# Patient Record
Sex: Female | Born: 1955 | Race: White | Hispanic: No | Marital: Single | Smoking: Current every day smoker
Health system: Southern US, Community
[De-identification: ages and names within clinical notes are randomized; demographics above are authoritative.]

## PROBLEM LIST (undated history)

## (undated) DIAGNOSIS — G4730 Sleep apnea, unspecified: Secondary | ICD-10-CM

## (undated) DIAGNOSIS — M797 Fibromyalgia: Secondary | ICD-10-CM

## (undated) DIAGNOSIS — I509 Heart failure, unspecified: Secondary | ICD-10-CM

## (undated) DIAGNOSIS — M1990 Unspecified osteoarthritis, unspecified site: Secondary | ICD-10-CM

## (undated) DIAGNOSIS — K219 Gastro-esophageal reflux disease without esophagitis: Secondary | ICD-10-CM

## (undated) DIAGNOSIS — I10 Essential (primary) hypertension: Secondary | ICD-10-CM

## (undated) DIAGNOSIS — M419 Scoliosis, unspecified: Secondary | ICD-10-CM

## (undated) DIAGNOSIS — J449 Chronic obstructive pulmonary disease, unspecified: Secondary | ICD-10-CM

## (undated) DIAGNOSIS — E119 Type 2 diabetes mellitus without complications: Secondary | ICD-10-CM

## (undated) HISTORY — PX: HIP SURGERY: SHX245

## (undated) HISTORY — PX: ABDOMINAL HYSTERECTOMY: SHX81

## (undated) HISTORY — PX: APPENDECTOMY: SHX54

## (undated) HISTORY — PX: CHOLECYSTECTOMY: SHX55

---

## 2004-03-13 ENCOUNTER — Ambulatory Visit: Payer: Self-pay | Admitting: Internal Medicine

## 2004-03-14 ENCOUNTER — Ambulatory Visit: Payer: Self-pay | Admitting: Pain Medicine

## 2006-01-20 ENCOUNTER — Emergency Department: Payer: Self-pay | Admitting: Emergency Medicine

## 2006-06-24 ENCOUNTER — Ambulatory Visit: Payer: Self-pay | Admitting: Cardiovascular Disease

## 2006-06-30 ENCOUNTER — Ambulatory Visit: Payer: Self-pay | Admitting: Pain Medicine

## 2006-07-07 ENCOUNTER — Ambulatory Visit: Payer: Self-pay | Admitting: Pain Medicine

## 2007-03-17 ENCOUNTER — Other Ambulatory Visit: Payer: Self-pay

## 2007-03-17 ENCOUNTER — Emergency Department: Payer: Self-pay | Admitting: Emergency Medicine

## 2007-04-16 ENCOUNTER — Inpatient Hospital Stay: Payer: Self-pay | Admitting: Internal Medicine

## 2007-04-22 ENCOUNTER — Encounter: Payer: Self-pay | Admitting: Internal Medicine

## 2007-05-17 ENCOUNTER — Emergency Department: Payer: Self-pay | Admitting: Emergency Medicine

## 2007-06-08 ENCOUNTER — Ambulatory Visit: Payer: Self-pay | Admitting: Physical Medicine & Rehabilitation

## 2007-06-08 ENCOUNTER — Encounter
Admission: RE | Admit: 2007-06-08 | Discharge: 2007-09-06 | Payer: Self-pay | Admitting: Physical Medicine & Rehabilitation

## 2007-07-07 ENCOUNTER — Ambulatory Visit: Payer: Self-pay | Admitting: Physical Medicine & Rehabilitation

## 2007-08-03 ENCOUNTER — Ambulatory Visit: Payer: Self-pay | Admitting: Physical Medicine & Rehabilitation

## 2007-09-11 ENCOUNTER — Encounter
Admission: RE | Admit: 2007-09-11 | Discharge: 2007-12-10 | Payer: Self-pay | Admitting: Physical Medicine & Rehabilitation

## 2007-09-14 ENCOUNTER — Ambulatory Visit: Payer: Self-pay | Admitting: Physical Medicine & Rehabilitation

## 2007-10-19 ENCOUNTER — Ambulatory Visit: Payer: Self-pay | Admitting: Physical Medicine & Rehabilitation

## 2007-11-19 ENCOUNTER — Ambulatory Visit: Payer: Self-pay | Admitting: Physical Medicine & Rehabilitation

## 2007-12-10 ENCOUNTER — Encounter
Admission: RE | Admit: 2007-12-10 | Discharge: 2008-03-09 | Payer: Self-pay | Admitting: Physical Medicine & Rehabilitation

## 2007-12-14 ENCOUNTER — Ambulatory Visit: Payer: Self-pay | Admitting: Physical Medicine & Rehabilitation

## 2008-01-11 ENCOUNTER — Ambulatory Visit: Payer: Self-pay | Admitting: Physical Medicine & Rehabilitation

## 2008-01-20 ENCOUNTER — Inpatient Hospital Stay: Payer: Self-pay | Admitting: Internal Medicine

## 2008-02-05 ENCOUNTER — Emergency Department: Payer: Self-pay | Admitting: Emergency Medicine

## 2008-02-12 ENCOUNTER — Ambulatory Visit: Payer: Self-pay | Admitting: Physical Medicine & Rehabilitation

## 2008-03-10 ENCOUNTER — Encounter
Admission: RE | Admit: 2008-03-10 | Discharge: 2008-06-08 | Payer: Self-pay | Admitting: Physical Medicine & Rehabilitation

## 2008-03-14 ENCOUNTER — Ambulatory Visit: Payer: Self-pay | Admitting: Physical Medicine & Rehabilitation

## 2008-04-14 ENCOUNTER — Ambulatory Visit: Payer: Self-pay | Admitting: Physical Medicine & Rehabilitation

## 2008-05-20 ENCOUNTER — Ambulatory Visit: Payer: Self-pay | Admitting: Physical Medicine & Rehabilitation

## 2008-05-29 ENCOUNTER — Emergency Department: Payer: Self-pay | Admitting: Emergency Medicine

## 2008-06-22 ENCOUNTER — Encounter
Admission: RE | Admit: 2008-06-22 | Discharge: 2008-06-27 | Payer: Self-pay | Admitting: Physical Medicine & Rehabilitation

## 2008-06-27 ENCOUNTER — Ambulatory Visit: Payer: Self-pay | Admitting: Physical Medicine & Rehabilitation

## 2008-09-15 ENCOUNTER — Emergency Department: Payer: Self-pay | Admitting: Emergency Medicine

## 2009-06-28 ENCOUNTER — Emergency Department: Payer: Self-pay | Admitting: Emergency Medicine

## 2009-08-13 ENCOUNTER — Emergency Department: Payer: Self-pay | Admitting: Emergency Medicine

## 2009-08-22 ENCOUNTER — Ambulatory Visit: Payer: Self-pay | Admitting: Pain Medicine

## 2009-08-30 ENCOUNTER — Ambulatory Visit: Payer: Self-pay | Admitting: Pain Medicine

## 2010-02-18 ENCOUNTER — Emergency Department: Payer: Self-pay | Admitting: Emergency Medicine

## 2010-03-06 ENCOUNTER — Ambulatory Visit: Payer: Self-pay | Admitting: Pain Medicine

## 2010-03-14 ENCOUNTER — Ambulatory Visit: Payer: Self-pay | Admitting: Pain Medicine

## 2010-03-19 ENCOUNTER — Emergency Department: Payer: Self-pay | Admitting: Emergency Medicine

## 2010-06-05 NOTE — Assessment & Plan Note (Signed)
Angela Daniel follows up today.  She had a left lumbar RF on December 14, 2007, which produced some relief of her left-sided low back pain.  Her  right-sided RF was performed on January 11, 2008, but this did not  cause any relief.  She had about 2 weeks of relief, but really has not  had any improvement after that time.  Her average pain however is 3/10  with her medications, which is overall an improvement compared to  starting with the injection procedures where it was running around 7/10.  Her pain currently is 5/10, interferes with activity at 7/10 level.  Her  right-sided pain is in the buttock region as well as low back.  Certainly, L5 and below.  Her pain is worse with walking, bending,  sitting, and standing.  Relief from meds is good however.  She does not  climb steps.  She does not drive.  She needs some assistance with meal  prep, household duties, and shopping.   Her review of systems is positive for trouble walking, spasms,  depression, anxiety.  Negative for suicidal thoughts.  Positive for  nausea, weight gain, night sweats, and breathing problems.  She does  have COPD.  She uses oxygen at home.   SOCIAL HISTORY:  Single, lives with her daughter, smokes half pack per  day despite her COPD.   PHYSICAL EXAMINATION:  VITAL SIGNS:  Blood pressure 112/73, pulse 56,  respirations 18, O2 sat 98% on room air.  GENERAL:  In no acute distress.  Orientation x3.  Affect is flat.  Voice  is hoarse.  EXTREMITIES:  Without edema.  BACK:  No evidence of injection site redness.  NEUROLOGIC:  She has normal sensation to lower extremity, normal deep  tendon reflexes, and normal strength.  She has some tenderness over the  right PSIS area and a positive FABER maneuver, but has tightness in  bilateral hips in internal and external rotation.   IMPRESSION:  1. Lumbar spondylosis without myelopathy.  2. Persistent right buttock pain.  She may have sacroiliac disorder      and we will  further assess with sacroiliac injection.   We will continue her fentanyl patch 50 mcg q.48 hours as well as her  Percocet 10/325 one p.o. t.i.d.  Written new prescriptions today.  I  will see her back for a sacroiliac injection in the next available  appointment.      Erick Colace, M.D.  Electronically Signed     AEK/MedQ  D:  02/12/2008 17:43:57  T:  02/13/2008 08:51:37  Job #:  [id number]

## 2010-06-05 NOTE — Procedures (Signed)
[name], Angela Daniel                  ACCOUNT NO.:  [id number]   MEDICAL RECORD NO.:  [id number]           PATIENT TYPE:   LOCATION:                                 FACILITY:   PHYSICIAN:  Erick Colace, M.D.DATE OF BIRTH:  [birth year]   DATE OF PROCEDURE:  DATE OF DISCHARGE:                               OPERATIVE REPORT   PROCEDURE:  Right sacroiliac injection under fluoroscopic guidance.   INDICATIONS:  Right sacroiliac distribution pain.  She had good results  with left sacroiliac injection last month.   Pain is only partially responsive to medication management and  interferes self-care mobility.   Informed consent was obtained after describing the risks and benefits of  the procedure with the patient.  These include bleeding, bruising,  infection, as well as lower extremity weakness.  She elected to proceed  and has given written consent.  The patient placed prone on the  fluoroscopy table.  Betadine prep, sterilely draped.  A 25-gauge, 1-1/2-  inch needle was used to anesthetize the skin and subcutaneous tissue, 1%  lidocaine x2 mL, then a 25-gauge, 3-inch spinal needle was inserted  under fluoroscopic guidance targeting the right sacroiliac joint.  AP,  lateral, and oblique imaging utilized.  Omnipaque 180 under live fluoro  demonstrated no intravascular uptake x0.5 cc, good joint spread followed  by injection of 0.5 mL of 40 mg/mL Depo-Medrol, and 1 mL of 2% MPF  lidocaine.  The patient tolerated the procedure well.  Pre and post  injection vitals stable.  Post injection instructions given.  Nursing  visit in 1 month.  I will see her back in 2 months for repeat injection,  but on the left side.      Erick Colace, M.D.  Electronically Signed     AEK/MEDQ  D:  04/14/2008 11:46:22  T:  04/14/2008 23:53:47  Job:  [id number]

## 2010-06-05 NOTE — Assessment & Plan Note (Signed)
HISTORY OF PRESENT ILLNESS:  The patient is originally here for epidural  injection; however, she would also like to discuss the medications.  She  has a history of hip replacement, lumbar spinal stenosis with chronic  low back and hip pain.  She had a urine drug screen performed as part of  her initial evaluation, which was consistent with medications that she  reported and therefore, we were able to start her back onto narcotic  analgesics, she had been on 75 mg q.48 h.  We restarted her on 25 mcg,  as she had been off medication for a period of time.  We have also been  using Percocet 10/325 t.i.d. for breakthrough pain.   She states her pain is constant in her back, mainly.  Her sleep is poor.  Pain interferes at 8-9/10 level, the need for meds is only fair. She  cannot walk very far.  She does not climb steps.  She needs some assist  with meal prep and household duties.   REVIEW OF SYSTEMS:  Positive for numbness, tingling, trouble walking,  spasms, depression, and anxiety, but negative for suicidal thoughts.  She has some breathing difficulties and diarrhea as well.   She sees the psychiatrist and primary care physician.   SOCIAL HISTORY:  Lives with her daughter.   PHYSICAL EXAMINATION:  GENERAL:  No acute distress.  Mood and affect  appropriate.  Her gait shows no toe drag or knee instability.  Orientation x3.  EXTREMITIES:  Without edema.  VITAL SIGNS:  Blood pressure 131/76, pulse 64, respirations 18, and O2  sat 100% on room air.   IMPRESSION:  1. Lumbar stenosis.  2. Lumbar spondylosis.  3. Lumbar radiculitis.   PLAN:  1. We will increase her fentanyl to 50 mcg q.72 h.  2. Continue on current dose of Percocet 10/325 t.i.d.  We will      reassess.  3. She will go through with her epidural today as well.      Erick Colace, M.D.  Electronically Signed     AEK/MedQ  D:  08/03/2007 10:57:31  T:  08/04/2007 06:24:46  Job #:  [id number]

## 2010-06-05 NOTE — Procedures (Signed)
NAMEKASSADIE, PANCAKE                  ACCOUNT NO.:  [id number]   MEDICAL RECORD NO.:  [id number]          PATIENT TYPE:  REC   LOCATION:  [facility]                          FACILITY:  [facility]   PHYSICIAN:  Erick Colace, M.D.DATE OF BIRTH:  10/25/1955   DATE OF PROCEDURE:  DATE OF DISCHARGE:                               OPERATIVE REPORT   PROCEDURE:  L3-L4 translaminar lumbar epidural steroid injection under  fluoroscopic guidance.   INDICATIONS:  Left lower extremity pain in the thigh and proximal leg.  Pain is interfering with self-care mobility.   Pain is only partially responsive to medication management and other  conservative care.    Informed consent was obtained after describing the risks and benefits of  the procedure with the patient.  These include bleeding, bruising, and  infection, and she elects to proceed and has given written consent.  The  patient placed prone on the fluoroscopy table.  Betadine prep and  sterile drape.  A 25-gauge 1-1/2-inch needle was used to anesthetize the  skin and subcu tissue with 1% lidocaine x2 mL. Then, an 18-gauge [place]  needle was inserted under fluoroscopic guidance starting at L3-L4  interlaminar space.  AP, lateral, and oblique imaging utilized.  Omnipaque 180 and loss of resistance technique was utilized as well.  Positive loss of resistance obtained.  Confirmed with Omnipaque 180 x  1.5 mL showing good epidural spread followed by injection with 2 mL of  40 mg/mL Depo-Medrol and 2.5 mL of 1% MPF lidocaine.  The patient  tolerated the procedure well.  Pre- and post-injection vitals stable.  Post-injection instructions given.  An 95-month followup.      Erick Colace, M.D.  Electronically Signed     AEK/MEDQ  D:  09/14/2007 11:42:55  T:  09/15/2007 03:40:50  Job:  [id number]

## 2010-06-05 NOTE — Group Therapy Note (Signed)
Consult requested by Dr. Timoteo Gaul for the evaluation of chronic low back  pain.  Date of birth 05-May-1955.   CHIEF COMPLAINT:  Neck pain, leg pain.   HISTORY:  A 55 year old female with back pain since 1999.  [place] did a single spine  injection which was not helpful and started the patient on fentanyl  patch.  She has been on the fentanyl patch for several years.  She has  seen Dr. Lacie Scotts, a primary care physician who lost his medical license  due to narcotic analgesic monitoring deficiencies.  Patient complains of  pain that is described as dull, constant, aching in her upper and lower  back.  She has tingling and numbness in her legs.  Her pain interferes  with general activity at a 7/10, enjoyment of life at an 8/10 and  relationships with other people at a 7/10.  Her time of day where her  pain is worst is morning and daytime hours.  Her pain is also worse with  walking, bending, sitting, standing.  Her pain improves with medications  and relief from medications is good.  She does not climb steps, she does  not drive.  She states that her daughter drives her most places where  she needs to go.   She is independent with her activities of daily living but needs some  assistance with meal prep and other household duties.  Her review of  systems positive for weakness, numbness, tingling, spasms, depression,  anxiety.  Review of systems also positive for fever, chills, weight  gain, night sweats, reflux, wheezing, coughing, shortness of breath.  She reports a recent hospitalization for pneumonia and congestive heart  failure.  This was at [facility], I do not have the records.  Her  other past medical history significant for bipolar disorder diagnosed by  psychiatrist, Dr. Janeece Riggers, at [facility].   PAST MEDICAL HISTORY:  Significant for:  1. Heart disease.  2. Lung problems.  3. Stomach.  4. High blood pressure.   PAST SURGICAL HISTORY:  1. Left hip replacement in 1999 or 2000 at [facility] which she      states did not help with her pain.  2. She has also undergone:      a.     Appendectomy.      b.     Cholecystectomy.      c.     Three C-sections      d.     Hysterectomy.   SOCIAL HISTORY:  She is single, lives with her daughter.   FAMILY HISTORY:  1. Heart disease.  2. Lung disease.  3. Diabetes.  4. Hypertension.  5. Psychiatric problems.  6. Disability.   EXAMINATION:  Her blood pressure is 123/63, pulse 87, respiratory rate  is 18, O2 sat 97% on room air.  GENERAL:  No acute distress, orientation x3.  Affect is alert.  Gait is  normal.  NECK:  Full range of motion.  She has negative Spurling's maneuver.  UPPER EXTREMITY RANGE OF MOTION AND JOINT STABILITY:  Normal.  She has  normal strength at the biceps, triceps, grip as well as deltoids.  She  has mild pain in left shoulder with abduction testing.  BACK RANGE OF MOTION:  Has 75% forward flexion but only 25% extension;  however, she states that forward flexion is more painful than extension.  She has no evidence of scoliosis.  She has only mild tenderness to  palpation of the lumbar paraspinals but otherwise nontender in the  cervical or thoracic spine.  LOWER EXTREMITY:  Range of motion normal.  Strength normal.  Joint  stability normal.  She has a scar over left hip corresponding to her  report of total hip replacement.  She has normal deep tendon reflexes  bilateral upper and lower extremities.  She has normal sensation in  bilateral upper and lower extremities.  She has no evidence of edema and  she has normal peripheral pulses.   Review of MRI dated October 28, 2006, showed no abnormalities at L1-2,  there is mild facet changes L2-3 level, mild bulge but no central or  foraminal narrowing and L3/4 mild bulge, mild facet changes, mild left  foraminal narrowing, some encroachment of left L3 nerve root within the   neural foramen.  Right L4-5 moderate facet changes, mild central canal  stenosis, mild bilateral lateral recess narrowing and mild to moderate  bilateral neural foraminal narrowing left greater than right, may be  encroachment of left L4 nerve root within the foramen.  L5 S1 mild  bulge, mild facet changes, no stenosis.   IMPRESSION:  1. Lumbar pain.  Exam is more limited in extension which makes me      question facet problems; however, forward flexion causes more pain.      She does not have any major disk protrusions.  She does have some      mild spinal stenosis at L4 to 5 level.  Question whether the reason      her left hip replacement did not result in any improvement could be      that this may represent either an L3 or L4 radiculopathy.  MRI was      an automated model, right lower extremity was studied in terms of      needle EMG and nerve conductions were reported as normal, reports      of 1+ positive sharp waves right L4 and left L5 paraspinals, this      was performed January 21, 2007.  This patient has not had any      significant physical therapy other than post total hip, I think      this would be in order.  2. Lidoderm trial for her low back and axial pain.  3. Urine drug screen, no narcotic analgesics until results are      available, should really just show some Percocet and no fentanyl.  4. Lower extremity numbness, tingling, would doubt it is related to      her back given her last lumbar MRI showing no significant stenosis,      especially on the right side.  May need to repeat EMG given the      type that was performed at Dr. Carron Brazen office.   Thank you for this interesting consultation, I will keep you apprised of  the situation.  I discussed my findings with the patient, she  understands that no narcotic analgesic will be prescribed until I have a  chance to review and in the  interval time will try non-narcotic treatment.  If she has non-reported   opiates or illicit drugs will be discharged from clinic.      Erick Colace, M.D.  Electronically Signed     AEK/MedQ  D:  06/08/2007 16:17:39  T:  06/08/2007 17:49:50  Job #:  [id number]   cc:   Galen Daft. Timoteo Gaul, M.D.  [address]  [place], [place] [address]   Lynett Fish, M.D.  [facility]

## 2010-06-05 NOTE — Procedures (Signed)
Angela Daniel, Angela Daniel                  ACCOUNT NO.:  [id number]   MEDICAL RECORD NO.:  [id number]          PATIENT TYPE:  REC   LOCATION:  [facility]                          FACILITY:  [facility]   PHYSICIAN:  Erick Colace, M.D.DATE OF BIRTH:  06/15/1955   DATE OF PROCEDURE:  DATE OF DISCHARGE:                               OPERATIVE REPORT   PROCEDURE:  Left L3 transforaminal lumbar epidural steroid injection  under fluoroscopic guidance.   INDICATION:  Disk bulge L3-L4 with encroachment of left L3 nerve root  within the neural foramen causing lower extremity pain, only partially  responsive to medication management including narcotic analgesics and  interfering with self-care and mobility.   PROCEDURE:  Informed consent was obtained after describing risks and  benefits of the procedure with the patient.  These include bleeding,  bruising, and infection as well as temporary or permanent paralysis.  She elects to proceed and has given written consent.  The patient was  placed prone on fluoroscopy table.  Betadine prep, sterile drape.  A 25-  gauge 1-1/2 inch needle was used to anesthetize the skin and  subcutaneous tissue, 1% lidocaine x2 mL and a 22-gauge 3-1/2 inch spinal  needle was inserted under fluoroscopic guidance, first targeting the  left L3-L4 intervertebral foramen.  AP, lateral, and oblique imaging  utilized.  Omnipaque 180 demonstrated good nerve root as well as  epidural spread followed by injection of 1 mL of 10 mg/mL dexamethasone  and 2 mL of 1% MPF lidocaine.  The patient tolerated the procedure well.  Pre and post-injection instructions given.  Post-injection vitals  stable.  Pre-injection pain level 6-7/10 and post-injection 4-5/10.  We  will follow up in 1 month, possible repeat injection.      Erick Colace, M.D.  Electronically Signed     AEK/MEDQ  D:  08/03/2007 10:54:27  T:  08/03/2007 22:11:37  Job:  [id number]

## 2010-06-05 NOTE — Procedures (Signed)
NAMEERICKA, Angela Daniel                  ACCOUNT NO.:  [id number]   MEDICAL RECORD NO.:  [id number]           PATIENT TYPE:   LOCATION:                                 FACILITY:   PHYSICIAN:  Erick Colace, M.D.DATE OF BIRTH:  12/11/1955   DATE OF PROCEDURE:  DATE OF DISCHARGE:                               OPERATIVE REPORT   PROCEDURE:  Bilateral L5 dorsal ramus injection, bilateral L4 medial  branch block, bilateral L3 medial branch block under fluoroscopic  guidance.   INDICATIONS:  Lumbosacral spondylosis without myelopathy.  Procedure  done under both sacral and lumbar fluoroscopic guidance.   Informed consent was obtained after describing risks and benefits of the  procedure with the patient.  These include bleeding, bruising,  infection.  She elects to proceed and has given written consent.  The  patient placed prone on fluoroscopy table.  Betadine prep, sterilely  draped.  A 25-gauge, 1-1/2-inch needle was used to anesthetize the skin  and subcu tissue, 1% lidocaine x2 mL and 22-gauge, 3-1/2-inch needle was  inserted under fluoroscopic guidance first starting at the left S1, SAP,  and sacroiliac junction.  Bone contact made, confirmed with lateral  imaging.  Omnipaque 180 x 0.5 mL demonstrated no intravascular uptake.  Then, 0.5 mL dexamethasone-lidocaine solution was injected, the solution  containing 1 mL of 4 mg/ mL dexamethasone, 2 mL of 2% MPF lidocaine.  Then, the left L5, SAP, and transverse process junction targeted, bone  contact made confirmed with lateral imaging.  Omnipaque 180 x 0.5 mL  demonstrated no intravascular uptake.  Then, 0.5 mL of dexamethasone-  lidocaine solution was injected.  Then, the left L4, SAP, transverse  process junction targeted, bone contact made confirmed with lateral  imaging.  Omnipaque 180 x 0.5 mL demonstrated no intravascular uptake.  Then, 0.5 mL of dexamethasone-lidocaine solution was injected.  The  patient tolerated the  procedure well.  The same procedure was repeated  on the right side at corresponding levels using same needle injectate  technique.  The patient tolerated the procedure well.  Pre-injection and  post injection vitals stable.  Post injection instructions given.  Pre  injection 5/10, post injection 3/10, consider sacroiliac versus repeat  medial branch blocks next visit.      Erick Colace, M.D.  Electronically Signed     AEK/MEDQ  D:  10/19/2007 13:33:33  T:  10/20/2007 03:04:02  Job:  [id number]

## 2010-06-05 NOTE — Procedures (Signed)
Angela Daniel, Angela Daniel                  ACCOUNT NO.:  [id number]   MEDICAL RECORD NO.:  [id number]          PATIENT TYPE:  REC   LOCATION:  [facility]                          FACILITY:  [facility]   PHYSICIAN:  Erick Colace, M.D.DATE OF BIRTH:  August 23, 1955   DATE OF PROCEDURE:  06/27/2008  DATE OF DISCHARGE:                               OPERATIVE REPORT   PROCEDURE:  Bilateral sacroiliac injection under fluoroscopic guidance.   INDICATIONS:  Sacroiliac pain.  She had good results with right-sided  injection and now she is here for bilateral, given that she has similar  pain on the left side.  Pain is only partially responsive to medication  management, interferes with self-care mobility and is graded at a 5/10  at rest, but 7/10 with activity.   Informed consent was obtained after describing risks and benefits of the  procedure with the patient.  These include bleeding, bruising, and  infection as well as temporary or permanent paralysis.  She elects to  proceed and has given written consent.  The patient was placed prone on  the fluoroscopy table.  Betadine prep, sterile drape.  A 25-gauge 1-1/2-  inch needle was used to anesthetize the skin and subcutaneous tissue  with 1% lidocaine x2 mL.  Then, a 25-gauge 3-inch spinal needle was  inserted under fluoroscopic guidance, first in the left SI joint.  AP,  lateral, and oblique images were utilized.  Omnipaque 180 under live  fluoro x0.3 mL demonstrated good joint outline followed by injection of  1 mL of 2% MPF lidocaine and 1.5 mL of 40 mg/mL Depo-Medrol.  The same  procedure was repeated on the right side using same needle, injectate,  and technique.  The patient tolerated the procedure well.  Pre -and post-  injection vitals were stable.  Post-injection instructions were given.  She will have nursing visit in about 3 weeks for medication and see me  in about 7 weeks.      Erick Colace, M.D.  Electronically Signed     AEK/MEDQ  D:  06/27/2008 09:57:30  T:  06/28/2008 00:44:52  Job:  [id number]

## 2010-06-05 NOTE — Procedures (Signed)
NAMEBLESS, LISENBY                  ACCOUNT NO.:  [id number]   MEDICAL RECORD NO.:  [id number]           PATIENT TYPE:   LOCATION:                                 FACILITY:   PHYSICIAN:  Erick Colace, M.D.DATE OF BIRTH:  1955/07/09   DATE OF PROCEDURE:  DATE OF DISCHARGE:                               OPERATIVE REPORT   PROCEDURE:  This is a bilateral L5 dorsal ramus injection, bilateral L4  medial branch block, and bilateral L3 medial branch block under  fluoroscopic guidance.   INDICATION:  Lumbar facet-mediated pain spondylosis without myelopathy.  Pain interferes with self-care mobility and was relieved after prior set  of medial branch blocks performed 1 month ago.   Pain interferes with self-care and mobility and it persists despite  medication management including narcotic analgesics and other  conservative care.   Informed consent was obtained after describing the risks and benefits of  the procedure with the patient.  These include bleeding, bruising, and  infection.  She elects to proceed and has given a written consent.  The  patient was placed prone on fluoroscopy table.  Betadine prep and  sterile draped.  A 25-gauge, 1-1/2-inch needle was used to anesthetize  the skin and subcutaneous tissue with 1% lidocaine x2 mL.  Then, a 22-  gauge, 3-1/2 inch spinal needle was inserted under fluoroscopic  guidance.  First targeting the left S1 SAP sacral ala junction, bone  contact made confirmed with lateral imaging.  Omnipaque 180 x 0.5 mL  demonstrated no intravascular uptake, then 0.5 mL of dexamethasone-  lidocaine solution injected.  Then, the left L5 SAP transverse process  junction, targeted bone contact made confirmed with lateral imaging.  Omnipaque 180 x 0.5 mL demonstrated no intravascular uptake, then 0.5 mL  dexamethasone-lidocaine solution was injected.  Then, a left L4 SAP  transverse process junction, targeted bone contact made confirmed with  lateral  imaging.  Omnipaque 180 x 0.5 mL demonstrated no intravascular  uptake, then 0.5 mL dexamethasone-lidocaine solution was injected.  The  same procedure was repeated on the right side with same needle injectate  and technique.  The patient tolerated the procedure well.  Pre- and post-  injection and vitals stable.  Post-injection instructions were given.  Post-injection pain levels are similar.      Erick Colace, M.D.  Electronically Signed     AEK/MEDQ  D:  11/19/2007 11:41:54  T:  11/20/2007 01:29:15  Job:  [id number]

## 2010-06-05 NOTE — Procedures (Signed)
Angela Daniel, Angela Daniel                  ACCOUNT NO.:  [id number]   MEDICAL RECORD NO.:  [id number]           PATIENT TYPE:   LOCATION:                                 FACILITY:   PHYSICIAN:  Erick Colace, M.D.DATE OF BIRTH:  04-27-1955   DATE OF PROCEDURE:  DATE OF DISCHARGE:                               OPERATIVE REPORT   This is a left sacroiliac injection under fluoroscopic guidance.   INDICATIONS:  Left sacroiliac distribution pain.  Pain only partially  responsive to medication management and interferes with self-care and  mobility.   Informed consent was obtained after describing risks and benefits of  procedure to the patient.  These include bleeding, bruising, infection,  as well as lower extremity weakness.  She elects to proceed and has  given consent.   The patient was placed prone on fluoroscopy table.  Betadine prep,  sterile drape.  A 25-gauge inch and a half needle was used to  anesthetize the skin and subcu tissue, 1% lidocaine x2 mL.  Then, a 25-  gauge 3-inch spinal needle was inserted under fluoroscopic guidance  targeting the left SI joint.  AP, lateral, and oblique imaging utilized.  Omnipaque 180 under live fluoro demonstrated no intravascular uptake.  Good joint spread followed by injection of 0.5 mL of 40 mg/mL Depo-  Medrol and 1 mL of 2% MPF lidocaine.  The patient tolerated the  procedure well.  Pre and postinjection vitals stable.  Postinjection  instructions given.      Erick Colace, M.D.  Electronically Signed     AEK/MEDQ  D:  03/14/2008 11:31:15  T:  03/15/2008 00:47:23  Job:  [id number]

## 2010-06-05 NOTE — Procedures (Signed)
NAMESYNDEY, JASKOLSKI                  ACCOUNT NO.:  [id number]   MEDICAL RECORD NO.:  [id number]           PATIENT TYPE:   LOCATION:                                 FACILITY:   PHYSICIAN:  Erick Colace, M.D.DATE OF BIRTH:  12-31-1955   DATE OF PROCEDURE:  DATE OF DISCHARGE:                               OPERATIVE REPORT   PROCEDURE:  Right L5 dorsal ramus radiofrequency neurotomy, right L4  medial branch radiofrequency neurotomy, and right L3 medial branch  radiofrequency neurotomy under fluoroscopic guidance.   INDICATIONS:  Lumbar facet-mediated pain, spondylosis without myelopathy  with pain interfere with self-care mobility relieved with 2 sets of  medial branch blocks and improvements on the left side after  radiofrequency procedure at corresponding levels 1 month ago.   Informed consent was obtained after describing risks and benefits of  procedure with the patient, these include bleeding, bruising, and  infection as well as loss of bowel or bladder function and paralysis.  She elects to proceed and has given written consent.  The patient was  placed prone on fluoroscopy table.  Betadine prep, sterile drape, 25-  gauge 1.5-inch needle was used to anesthetize the skin and subcu tissue  with 1% lidocaine x2 mL at each of three sites, and a 20-gauge 10-cm RF  needle with 10-mm curved active tip was inserted under fluoroscopic  guidance starting at right S1-SAP sacral ala junction, bone contact  made, confirmed with lateral imaging, sensory stem at 50 Hz followed by  motor stem at 2 Hz confirmed proper needle location.  This was followed  by injection of 1 mL of solution containing 1 mL of 4 mg/mL  dexamethasone and 2 mL of 1% MPF lidocaine followed by radiofrequency  lesioning initially at 70 degrees, but reduced to 60 degrees after some  burning sensation for 70 seconds.  Then, the right L5-SAP transverse  junction targeted, bone contact made, confirmed with lateral  imaging.  Sensory stem at 50 Hz followed by motor stem at 2 Hz confirmed proper  needle location followed by injection of 1 mL of dexamethasone-lidocaine  solution and radiofrequency lesioning at 70 degrees Celsius for 70  seconds.  Then, the right L4-SAP transverse junction targeted, bone  contact made, confirmed with lateral imaging.  Sensory stem at 50 Hz  followed by motor stem at 2 Hz confirmed proper needle location followed  by injection of 1 mL of dexamethasone-lidocaine solution and  radiofrequency lesioning at 70 degrees Celsius for 70 seconds.  The  patient tolerated procedure well.  Post-injection vitals stable.  Post-  injection instructions given.      Erick Colace, M.D.  Electronically Signed     AEK/MEDQ  D:  01/11/2008 09:57:00  T:  01/11/2008 23:12:03  Job:  [id number]

## 2010-06-05 NOTE — Assessment & Plan Note (Signed)
A 55 year old female with back pain since 1999.  She was seen by me in  initial consultation on Jun 08, 2007.  She has had her last MRI on  October 28, 2006, showing L3-L4 disk bulge, mild facet changes, mild left  foraminal narrowing, some encroachment on the left L3 nerve root within  the neuroforamen.   She has undergone prior left hip replacement, but really did not get any  benefit from the replacement in terms of pain relief in the left hip  area.  She did have EMG showing some positive sharp waves in the  paraspinals only.   She was trialed on Lidoderm for back and axial pain, which was not  helpful.  Her urine drug screen checked out consistent and hydrocodone  was called in.  She has been taking this 4 times a day, but it has not  been helpful for her to the degree that fentanyl and Percocet were  helpful.   She has not had any new medical problems in the interval time.   REVIEW OF SYSTEMS:  Positive for tingling, trouble walking, depression,  and anxiety.  She does continue to see Psychiatry.  She has had some  night sweats, she has had some respiratory infection and is planning to  have some dental work done due to some dental pain.   Sleep is poor.  Relief from meds is 3/10.   SOCIAL HISTORY:  Divorced, lives with her daughter, denies drug or  alcohol abuse.   PHYSICAL EXAMINATION:  Her blood pressure is 117/80, pulse 67,  respiration 18, and O2 sat 100% on room air.  Well-developed, well-  nourished female in no acute distress.  Orientation x3.  Affect is  alert.  Gait is normal.  Her back has tenderness at the L4-L5 and L5-S1 paraspinals.  Her back  range of motion is 75% forward flexion and 25% extension.  Her range of  motion is normal in the upper extremities, but in the lower extremities,  she has decreased internal and external rotation and bilateral hips,  knee, and ankle range of motion are normal.  Joint stability appears  normal.  Normal deep tendon  reflexes in the upper and lower extremities.   There is no evidence of edema in the upper or lower extremities.   IMPRESSION:  1. History of lumbar stenosis, worse at left L3.  This is her most      symptomatic area in terms of dermatomal and may represent her left      hip pain.  We will schedule for left L3 transforaminal lumbar      epidural steroid injection under fluoroscopic guidance.  2. In terms of her narcotic analgesic medications, we will switch her      back to her fentanyl, but reduce from the prior dose of 75 to 25      mcg and use Percocet 10/325 t.i.d. for breakthrough, as she does      have activity-related pain on top of her baseline back pain.  We      would consider other medications such as Lyrica or Neurontin for      lower extremity pain as well.  These have not been trialed by Dr.      Lacie Scotts; however, we do not have records from the [facility] in      [place].      Erick Colace, M.D.  Electronically Signed     AEK/MedQ  D:  07/07/2007 11:25:44  T:  07/07/2007 22:52:31  Job #:  [id number]   cc:   Lynett Fish, MD   Gelene Mink, MD

## 2010-06-05 NOTE — Procedures (Signed)
Angela Daniel, Angela Daniel                  ACCOUNT NO.:  [id number]   MEDICAL RECORD NO.:  [id number]          PATIENT TYPE:  REC   LOCATION:  [facility]                          FACILITY:  [facility]   PHYSICIAN:  Erick Colace, M.D.DATE OF BIRTH:  11-30-1955   DATE OF PROCEDURE:  DATE OF DISCHARGE:                               OPERATIVE REPORT   PROCEDURE:  Left L5 dorsal ramus radiofrequency neurotomy, left L4  medial branch radiofrequency neurotomy, left L3 medial branch  radiofrequency neurotomy under fluoroscopic guidance.   INDICATIONS:  Lumbar facet mediated pain, spondylosis without myelopathy  with pain interfering with self-care and mobility and relieved with 2  sets of medial branch blocks performed at corresponding levels.   Informed consent was obtained after describing risks and benefits of the  procedure with the patient.  These include bleeding, bruising,  infection.  She elects to proceed and has given written consent.  The  patient was placed prone on fluoroscopy table.  Betadine prep, and  sterile drape.  A 25-gauge, 1-1/2 needle was used to anesthetize the  skin and subcutaneous tissue, 1% lidocaine x2 mL.  Then, a 20-gauge 10-  cm RF needle with 10-mm curved active tip was inserted under  fluoroscopic guidance targeting the left S1 SAP, sacral ala junction  bone contact made, confirmed with lateral imaging.  Sensory stem at 50  Hz followed by motor stem at 2 Hz confirmed proper needle location  followed by injection of 1 mL of solution containing 1 mL of 4 mL  dexamethasone, 2 mL of 1% MPF lidocaine, followed by radiofrequency  lesioning 70 degrees Celsius for 70 seconds.  Then, the left L5 SAP  transverse process junction targeted, bone contact made, and confirmed  with lateral imaging.  Sensory stem at 50 Hz followed by motor stem at 2  Hz confirmed proper needle location followed by injection of 1 mL of the  dexamethasone and lidocaine solution and radiofrequency  lesioning 70  degrees for 70 seconds.  In the left L4 SAP, transverse process junction  targeted, bone contact made, confirmed with lateral imaging.  Sensory  stem at 50 Hz followed by motor stem at 2 Hz confirmed proper needle  location followed by injection of 1 mL of dexamethasone, lidocaine  solution, and radiofrequency lesioning 70 degrees Celsius for 70  seconds.  The patient tolerated the procedure well.  Pre- and post-  injection vitals stable.  Postinjection instructions given.      Erick Colace, M.D.  Electronically Signed     AEK/MEDQ  D:  12/14/2007 09:51:48  T:  12/15/2007 00:01:55  Job:  [id number]

## 2010-08-12 ENCOUNTER — Emergency Department: Payer: Self-pay | Admitting: Emergency Medicine

## 2010-09-20 ENCOUNTER — Emergency Department: Payer: Self-pay | Admitting: Emergency Medicine

## 2010-11-12 ENCOUNTER — Emergency Department: Payer: Self-pay | Admitting: Emergency Medicine

## 2010-11-14 ENCOUNTER — Emergency Department: Payer: Self-pay | Admitting: Internal Medicine

## 2010-11-26 ENCOUNTER — Emergency Department: Payer: Self-pay | Admitting: *Deleted

## 2012-06-28 ENCOUNTER — Emergency Department: Payer: Self-pay | Admitting: Emergency Medicine

## 2012-06-28 LAB — BASIC METABOLIC PANEL
Anion Gap: 6 — ABNORMAL LOW (ref 7–16)
BUN: 9 mg/dL (ref 7–18)
Calcium, Total: 8.8 mg/dL (ref 8.5–10.1)
Creatinine: 0.73 mg/dL (ref 0.60–1.30)
EGFR (African American): >60
Glucose: 118 mg/dL — ABNORMAL HIGH (ref 65–99)
Osmolality: 277 (ref 275–301)
Sodium: 139 mmol/L (ref 136–145)

## 2012-06-28 LAB — TROPONIN I: Troponin-I: <0.02 ng/mL

## 2012-06-28 LAB — CBC
MCH: 31.4 pg (ref 26.0–34.0)
MCHC: 34.6 g/dL (ref 32.0–36.0)
Platelet: 218 10*3/uL (ref 150–440)
RBC: 4.12 10*6/uL (ref 3.80–5.20)
RDW: 13.2 % (ref 11.5–14.5)
WBC: 8 10*3/uL (ref 3.6–11.0)

## 2013-11-08 ENCOUNTER — Emergency Department: Payer: Self-pay | Admitting: Emergency Medicine

## 2014-03-27 ENCOUNTER — Emergency Department: Payer: Self-pay | Admitting: Emergency Medicine

## 2014-10-25 ENCOUNTER — Encounter: Payer: Self-pay | Admitting: Emergency Medicine

## 2014-10-25 ENCOUNTER — Emergency Department
Admission: EM | Admit: 2014-10-25 | Discharge: 2014-10-25 | Discharge status: Against Medical Advice | Payer: Medicare Other | Attending: Student | Admitting: Student

## 2014-10-25 DIAGNOSIS — Z720 Tobacco use: Secondary | ICD-10-CM | POA: Insufficient documentation

## 2014-10-25 DIAGNOSIS — G8929 Other chronic pain: Secondary | ICD-10-CM

## 2014-10-25 DIAGNOSIS — R229 Localized swelling, mass and lump, unspecified: Secondary | ICD-10-CM | POA: Diagnosis not present

## 2014-10-25 DIAGNOSIS — F1120 Opioid dependence, uncomplicated: Secondary | ICD-10-CM

## 2014-10-25 DIAGNOSIS — Z760 Encounter for issue of repeat prescription: Secondary | ICD-10-CM | POA: Diagnosis present

## 2014-10-25 HISTORY — DX: Fibromyalgia: M79.7

## 2014-10-25 HISTORY — DX: Unspecified osteoarthritis, unspecified site: M19.90

## 2014-10-25 MED ORDER — OXYCODONE-ACETAMINOPHEN 5-325 MG PO TABS
1.0000 | ORAL_TABLET | Freq: Once | ORAL | Status: AC
  Administered 2014-10-25: 1 via ORAL
  Filled 2014-10-25: qty 1
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
## 2014-10-25 NOTE — ED Notes (Signed)
States she is here for medication refill . PA in room on arrival
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
## 2014-11-06 ENCOUNTER — Inpatient Hospital Stay
Admission: EM | Admit: 2014-11-06 | Discharge: 2014-11-09 | DRG: 190 | Disposition: A | Discharge status: Home / Self Care | Payer: Medicare Other | Attending: Internal Medicine | Admitting: Internal Medicine

## 2014-11-06 ENCOUNTER — Emergency Department: Payer: Medicare Other

## 2014-11-06 ENCOUNTER — Encounter: Payer: Self-pay | Admitting: Emergency Medicine

## 2014-11-06 DIAGNOSIS — Z8249 Family history of ischemic heart disease and other diseases of the circulatory system: Secondary | ICD-10-CM | POA: Diagnosis not present

## 2014-11-06 DIAGNOSIS — M1990 Unspecified osteoarthritis, unspecified site: Secondary | ICD-10-CM | POA: Diagnosis not present

## 2014-11-06 DIAGNOSIS — E785 Hyperlipidemia, unspecified: Secondary | ICD-10-CM | POA: Diagnosis not present

## 2014-11-06 DIAGNOSIS — Z825 Family history of asthma and other chronic lower respiratory diseases: Secondary | ICD-10-CM

## 2014-11-06 DIAGNOSIS — Z801 Family history of malignant neoplasm of trachea, bronchus and lung: Secondary | ICD-10-CM

## 2014-11-06 DIAGNOSIS — J9601 Acute respiratory failure with hypoxia: Secondary | ICD-10-CM | POA: Diagnosis present

## 2014-11-06 DIAGNOSIS — D72829 Elevated white blood cell count, unspecified: Secondary | ICD-10-CM | POA: Diagnosis present

## 2014-11-06 DIAGNOSIS — Z9889 Other specified postprocedural states: Secondary | ICD-10-CM | POA: Diagnosis not present

## 2014-11-06 DIAGNOSIS — M797 Fibromyalgia: Secondary | ICD-10-CM | POA: Diagnosis present

## 2014-11-06 DIAGNOSIS — S51012A Laceration without foreign body of left elbow, initial encounter: Secondary | ICD-10-CM | POA: Diagnosis present

## 2014-11-06 DIAGNOSIS — Z90710 Acquired absence of both cervix and uterus: Secondary | ICD-10-CM | POA: Diagnosis not present

## 2014-11-06 DIAGNOSIS — Z823 Family history of stroke: Secondary | ICD-10-CM

## 2014-11-06 DIAGNOSIS — I509 Heart failure, unspecified: Secondary | ICD-10-CM | POA: Diagnosis not present

## 2014-11-06 DIAGNOSIS — F17200 Nicotine dependence, unspecified, uncomplicated: Secondary | ICD-10-CM | POA: Diagnosis present

## 2014-11-06 DIAGNOSIS — G4730 Sleep apnea, unspecified: Secondary | ICD-10-CM | POA: Diagnosis present

## 2014-11-06 DIAGNOSIS — R000 Tachycardia, unspecified: Secondary | ICD-10-CM | POA: Diagnosis present

## 2014-11-06 DIAGNOSIS — J189 Pneumonia, unspecified organism: Secondary | ICD-10-CM

## 2014-11-06 DIAGNOSIS — Z9049 Acquired absence of other specified parts of digestive tract: Secondary | ICD-10-CM

## 2014-11-06 DIAGNOSIS — W19XXXA Unspecified fall, initial encounter: Secondary | ICD-10-CM | POA: Diagnosis not present

## 2014-11-06 DIAGNOSIS — Z833 Family history of diabetes mellitus: Secondary | ICD-10-CM

## 2014-11-06 DIAGNOSIS — J440 Chronic obstructive pulmonary disease with acute lower respiratory infection: Principal | ICD-10-CM | POA: Diagnosis present

## 2014-11-06 DIAGNOSIS — F329 Major depressive disorder, single episode, unspecified: Secondary | ICD-10-CM | POA: Diagnosis not present

## 2014-11-06 DIAGNOSIS — G8929 Other chronic pain: Secondary | ICD-10-CM | POA: Diagnosis not present

## 2014-11-06 DIAGNOSIS — J441 Chronic obstructive pulmonary disease with (acute) exacerbation: Secondary | ICD-10-CM | POA: Diagnosis not present

## 2014-11-06 DIAGNOSIS — F17210 Nicotine dependence, cigarettes, uncomplicated: Secondary | ICD-10-CM | POA: Diagnosis present

## 2014-11-06 HISTORY — DX: Chronic obstructive pulmonary disease, unspecified: J44.9

## 2014-11-06 HISTORY — DX: Heart failure, unspecified: I50.9

## 2014-11-06 HISTORY — DX: Sleep apnea, unspecified: G47.30

## 2014-11-06 LAB — BASIC METABOLIC PANEL
Anion gap: 6 (ref 5–15)
BUN: 16 mg/dL (ref 6–20)
CALCIUM: 8.7 mg/dL — AB (ref 8.9–10.3)
CO2: 32 mmol/L (ref 22–32)
CREATININE: 0.69 mg/dL (ref 0.44–1.00)
Chloride: 100 mmol/L — ABNORMAL LOW (ref 101–111)
GFR calc Af Amer: >60 mL/min (ref >60)
GFR calc non Af Amer: >60 mL/min (ref >60)
GLUCOSE: 96 mg/dL (ref 65–99)
Potassium: 3.6 mmol/L (ref 3.5–5.1)
Sodium: 138 mmol/L (ref 135–145)

## 2014-11-06 LAB — CBC WITH DIFFERENTIAL/PLATELET
Basophils Absolute: 0.1 10*3/uL (ref 0–0.1)
Basophils Relative: 1 %
EOS ABS: 0 10*3/uL (ref 0–0.7)
EOS PCT: 0 %
HCT: 34.7 % — ABNORMAL LOW (ref 35.0–47.0)
Hemoglobin: 11.9 g/dL — ABNORMAL LOW (ref 12.0–16.0)
LYMPHS ABS: 1.5 10*3/uL (ref 1.0–3.6)
LYMPHS PCT: 11 %
MCH: 31.8 pg (ref 26.0–34.0)
MCHC: 34.1 g/dL (ref 32.0–36.0)
MCV: 93.2 fL (ref 80.0–100.0)
MONO ABS: 0.8 10*3/uL (ref 0.2–0.9)
Monocytes Relative: 6 %
Neutro Abs: 11.6 10*3/uL — ABNORMAL HIGH (ref 1.4–6.5)
Neutrophils Relative %: 82 %
PLATELETS: 234 10*3/uL (ref 150–440)
RBC: 3.73 MIL/uL — ABNORMAL LOW (ref 3.80–5.20)
RDW: 12.4 % (ref 11.5–14.5)
WBC: 14.1 10*3/uL — ABNORMAL HIGH (ref 3.6–11.0)

## 2014-11-06 LAB — TROPONIN I: Troponin I: <0.03 ng/mL (ref <0.031)

## 2014-11-06 MED ORDER — TETANUS-DIPHTHERIA TOXOIDS TD 5-2 LFU IM INJ
0.5000 mL | INJECTION | Freq: Once | INTRAMUSCULAR | Status: AC
  Administered 2014-11-06: 0.5 mL via INTRAMUSCULAR
  Filled 2014-11-06: qty 0.5

## 2014-11-06 MED ORDER — IPRATROPIUM-ALBUTEROL 0.5-2.5 (3) MG/3ML IN SOLN
9.0000 mL | Freq: Once | RESPIRATORY_TRACT | Status: AC
  Administered 2014-11-06: 9 mL via RESPIRATORY_TRACT
  Filled 2014-11-06: qty 9

## 2014-11-06 MED ORDER — METHYLPREDNISOLONE SODIUM SUCC 125 MG IJ SOLR
125.0000 mg | Freq: Once | INTRAMUSCULAR | Status: AC
  Administered 2014-11-06: 125 mg via INTRAVENOUS
  Filled 2014-11-06: qty 2

## 2014-11-06 NOTE — ED Notes (Signed)
Pt appears very tired and sleepy

## 2014-11-06 NOTE — ED Notes (Signed)
RT at bedside at this time collecting blood gas.

## 2014-11-06 NOTE — ED Provider Notes (Signed)
[facility] Emergency Department Provider Note  ____________________________________________  Time seen: Approximately 950 PM  I have reviewed the triage vital signs and the nursing notes.   HISTORY  Chief Complaint Altered Mental Status; Cough; Shortness of Breath; Weakness; and Emesis    HPI Angela Daniel is a 59 y.o. female with a history of COPD and congestive heart failure who is presenting today with shortness of breath over the past week. She also had some confusion earlier in the day but her mental status is since improved. Patient continues to smoke. Patient says she has been using about one nebulizer treatment per day. Denies any pain except her left elbow where she had a fall with skin tear yesterday. Does not know date of last tetanus shot. Desat to 88% on room air prior to my arrival to the room. Placed on nasal cannula oxygen.   Past Medical History  Diagnosis Date  . Fibromyalgia   . Degenerative joint disease   . Sleep apnea   . COPD (chronic obstructive pulmonary disease) (HCC)   . Congestive heart failure (HCC)     There are no active problems to display for this patient.   Past Surgical History  Procedure Laterality Date  . Hip surgery Left   . Cesarean section    . Abdominal hysterectomy      No current outpatient prescriptions on file.  Allergies Review of patient's allergies indicates no known allergies.  History reviewed. No pertinent family history.  Social History Social History  Substance Use Topics  . Smoking status: Current Every Day Smoker -- 0.50 packs/day    Types: Cigarettes  . Smokeless tobacco: None  . Alcohol Use: No    Review of Systems Constitutional: No fever/chills Eyes: No visual changes. ENT: No sore throat. Cardiovascular: Denies chest pain. Respiratory: As above Gastrointestinal: No abdominal pain.  No nausea, no vomiting.  No diarrhea.  No constipation. Genitourinary: Negative for  dysuria. Musculoskeletal: Negative for back pain. Skin: Negative for rash. Neurological: Negative for headaches, focal weakness or numbness.  10-point ROS otherwise negative.  ____________________________________________   PHYSICAL EXAM:  VITAL SIGNS: ED Triage Vitals  Enc Vitals Group     BP 11/06/14 2125 152/89 mmHg     Pulse --      Resp 11/06/14 2125 18     Temp 11/06/14 2125 98.8 F (37.1 C)     Temp Source 11/06/14 2125 Oral     SpO2 11/06/14 2125 92 %     Weight --      Height --      Head Cir --      Peak Flow --      Pain Score 11/06/14 2125 8     Pain Loc --      Pain Edu? --      Excl. in GC? --     Constitutional: Alert and oriented. Well appearing and in no acute distress. Eyes: Conjunctivae are normal. PERRL. EOMI. Head: Atraumatic. Nose: No congestion/rhinnorhea. Mouth/Throat: Mucous membranes are moist.  Oropharynx non-erythematous. Neck: No stridor.   Cardiovascular: Normal rate, regular rhythm. Grossly normal heart sounds.  Good peripheral circulation. Respiratory: Mildly tachypneic. Coarse rhonchi throughout.  Gastrointestinal: Soft and nontender. No distention. No abdominal bruits. No CVA tenderness. Musculoskeletal: No lower extremity tenderness nor edema.  No joint effusions. Neurologic:  Normal speech and language. No gross focal neurologic deficits are appreciated. No gait instability. Skin:  Left lateral elbow with 2 cm skin tear which is superficial.  Small amount of oozing blood. There is no surrounding induration or pus.  Psychiatric: Mood and affect are normal. Speech and behavior are normal.  ____________________________________________   LABS (all labs ordered are listed, but only abnormal results are displayed)  Labs Reviewed  CBC WITH DIFFERENTIAL/PLATELET - Abnormal; Notable for the following:    WBC 14.1 (*)    RBC 3.73 (*)    Hemoglobin 11.9 (*)    HCT 34.7 (*)    Neutro Abs 11.6 (*)    All other components within normal  limits  BASIC METABOLIC PANEL - Abnormal; Notable for the following:    Chloride 100 (*)    Calcium 8.7 (*)    All other components within normal limits  BLOOD GAS, ARTERIAL - Abnormal; Notable for the following:    pO2, Arterial 111 (*)    Bicarbonate 31.9 (*)    Acid-Base Excess 6.4 (*)    Allens test (pass/fail) POSITIVE (*)    All other components within normal limits  TROPONIN I  URINALYSIS COMPLETEWITH MICROSCOPIC ([facility] ONLY)  URINE DRUG SCREEN, QUALITATIVE ([facility] ONLY)   ____________________________________________  EKG  ED ECG REPORT I, Arelia LongestSchaevitz,  David M, the attending physician, personally viewed and interpreted this ECG.   Date: 11/06/2014  EKG Time: 2131  Rate: 109  Rhythm: sinus tachycardia  Axis: Normal axis  Intervals: RSR prime pattern in V1. No change from EKG of 08/13/2010.  ST&T Change: No ST elevation or depression. No abnormal T-wave inversion.  ____________________________________________  RADIOLOGY  Mild bibasilar airspace opacities may reflect atelectasis or possible mild pneumonia. Left elbow without any acute bony abnormality. ____________________________________________   PROCEDURES    ____________________________________________   INITIAL IMPRESSION / ASSESSMENT AND PLAN / ED COURSE  Pertinent labs & imaging results that were available during my care of the patient were reviewed by me and considered in my medical decision making (see chart for details).  ----------------------------------------- 12:41 AM on 11/07/2014 -----------------------------------------  Patient arousable but still with mild somnolence when I go into the room. Unlikely to be from CO2 narcosis. Denies any headache or hitting her head when she fell yesterday. We will admit to the hospital for further treatment for COPD and pneumonia. Has not had a recent hospital admission within the last 3 months. Discussed the need for admission with the patient as well as the  family who understand and are willing to comply. Signed out to Dr. Anne HahnWillis of the medicine service. ____________________________________________   FINAL CLINICAL IMPRESSION(S) / ED DIAGNOSES  Acute COPD exacerbation with pneumonia.    Myrna Blazeravid Matthew Schaevitz, MD 11/07/14 [phone]

## 2014-11-06 NOTE — ED Notes (Addendum)
Pt presented to stat registration with niece; niece reports pt confused; productive cough; sats 92% on room air; HR 118; pt alert and oriented x 3 at present; becomes tearful when talking about feeling confused; pt says she fell last weekend-tripped on her comforter; wound to left forearm that has not been seen by a provider; pt says the dressing has been on for 2 days

## 2014-11-07 ENCOUNTER — Encounter: Payer: Self-pay | Admitting: Internal Medicine

## 2014-11-07 DIAGNOSIS — J9601 Acute respiratory failure with hypoxia: Secondary | ICD-10-CM | POA: Diagnosis present

## 2014-11-07 DIAGNOSIS — Z9049 Acquired absence of other specified parts of digestive tract: Secondary | ICD-10-CM | POA: Diagnosis not present

## 2014-11-07 DIAGNOSIS — Z90710 Acquired absence of both cervix and uterus: Secondary | ICD-10-CM | POA: Diagnosis not present

## 2014-11-07 DIAGNOSIS — Z8249 Family history of ischemic heart disease and other diseases of the circulatory system: Secondary | ICD-10-CM | POA: Diagnosis not present

## 2014-11-07 DIAGNOSIS — D72829 Elevated white blood cell count, unspecified: Secondary | ICD-10-CM | POA: Diagnosis present

## 2014-11-07 DIAGNOSIS — I509 Heart failure, unspecified: Secondary | ICD-10-CM | POA: Diagnosis present

## 2014-11-07 DIAGNOSIS — G4730 Sleep apnea, unspecified: Secondary | ICD-10-CM | POA: Diagnosis present

## 2014-11-07 DIAGNOSIS — M1990 Unspecified osteoarthritis, unspecified site: Secondary | ICD-10-CM | POA: Diagnosis present

## 2014-11-07 DIAGNOSIS — Z833 Family history of diabetes mellitus: Secondary | ICD-10-CM | POA: Diagnosis not present

## 2014-11-07 DIAGNOSIS — J441 Chronic obstructive pulmonary disease with (acute) exacerbation: Secondary | ICD-10-CM | POA: Diagnosis present

## 2014-11-07 DIAGNOSIS — Z9889 Other specified postprocedural states: Secondary | ICD-10-CM | POA: Diagnosis not present

## 2014-11-07 DIAGNOSIS — Z801 Family history of malignant neoplasm of trachea, bronchus and lung: Secondary | ICD-10-CM | POA: Diagnosis not present

## 2014-11-07 DIAGNOSIS — F17200 Nicotine dependence, unspecified, uncomplicated: Secondary | ICD-10-CM | POA: Diagnosis present

## 2014-11-07 DIAGNOSIS — W19XXXA Unspecified fall, initial encounter: Secondary | ICD-10-CM | POA: Diagnosis present

## 2014-11-07 DIAGNOSIS — S51012A Laceration without foreign body of left elbow, initial encounter: Secondary | ICD-10-CM | POA: Diagnosis present

## 2014-11-07 DIAGNOSIS — F329 Major depressive disorder, single episode, unspecified: Secondary | ICD-10-CM | POA: Diagnosis present

## 2014-11-07 DIAGNOSIS — M797 Fibromyalgia: Secondary | ICD-10-CM | POA: Diagnosis present

## 2014-11-07 DIAGNOSIS — F17210 Nicotine dependence, cigarettes, uncomplicated: Secondary | ICD-10-CM | POA: Diagnosis present

## 2014-11-07 DIAGNOSIS — G8929 Other chronic pain: Secondary | ICD-10-CM | POA: Diagnosis present

## 2014-11-07 DIAGNOSIS — R000 Tachycardia, unspecified: Secondary | ICD-10-CM | POA: Diagnosis present

## 2014-11-07 DIAGNOSIS — Z823 Family history of stroke: Secondary | ICD-10-CM | POA: Diagnosis not present

## 2014-11-07 DIAGNOSIS — E785 Hyperlipidemia, unspecified: Secondary | ICD-10-CM | POA: Diagnosis present

## 2014-11-07 DIAGNOSIS — J189 Pneumonia, unspecified organism: Secondary | ICD-10-CM | POA: Diagnosis present

## 2014-11-07 DIAGNOSIS — Z825 Family history of asthma and other chronic lower respiratory diseases: Secondary | ICD-10-CM | POA: Diagnosis not present

## 2014-11-07 LAB — URINALYSIS COMPLETE WITH MICROSCOPIC (ARMC ONLY)
Bilirubin Urine: NEGATIVE
Glucose, UA: NEGATIVE mg/dL
Leukocytes, UA: NEGATIVE
NITRITE: NEGATIVE
PH: 6 (ref 5.0–8.0)
PROTEIN: NEGATIVE mg/dL
SPECIFIC GRAVITY, URINE: 1.014 (ref 1.005–1.030)

## 2014-11-07 LAB — BLOOD GAS, ARTERIAL
Acid-Base Excess: 6.4 mmol/L — ABNORMAL HIGH (ref 0.0–3.0)
Allens test (pass/fail): POSITIVE — AB
Bicarbonate: 31.9 mEq/L — ABNORMAL HIGH (ref 21.0–28.0)
FIO2: 0.28
O2 SAT: 98.5 %
PCO2 ART: 48 mmHg (ref 32.0–48.0)
PO2 ART: 111 mmHg — AB (ref 83.0–108.0)
Patient temperature: 37
pH, Arterial: 7.43 (ref 7.350–7.450)

## 2014-11-07 LAB — CBC
HEMATOCRIT: 34.4 % — AB (ref 35.0–47.0)
Hemoglobin: 11.6 g/dL — ABNORMAL LOW (ref 12.0–16.0)
MCH: 31.2 pg (ref 26.0–34.0)
MCHC: 33.7 g/dL (ref 32.0–36.0)
MCV: 92.4 fL (ref 80.0–100.0)
Platelets: 251 10*3/uL (ref 150–440)
RBC: 3.72 MIL/uL — ABNORMAL LOW (ref 3.80–5.20)
RDW: 12.2 % (ref 11.5–14.5)
WBC: 11.9 10*3/uL — AB (ref 3.6–11.0)

## 2014-11-07 LAB — URINE DRUG SCREEN, QUALITATIVE (ARMC ONLY)
Amphetamines, Ur Screen: POSITIVE — AB
BARBITURATES, UR SCREEN: NOT DETECTED
BENZODIAZEPINE, UR SCRN: NOT DETECTED
CANNABINOID 50 NG, UR ~~LOC~~: NOT DETECTED
Cocaine Metabolite,Ur ~~LOC~~: NOT DETECTED
MDMA (ECSTASY) UR SCREEN: NOT DETECTED
Methadone Scn, Ur: NOT DETECTED
Opiate, Ur Screen: POSITIVE — AB
Phencyclidine (PCP) Ur S: NOT DETECTED
TRICYCLIC, UR SCREEN: NOT DETECTED

## 2014-11-07 MED ORDER — PANTOPRAZOLE SODIUM 40 MG PO TBEC
40.0000 mg | DELAYED_RELEASE_TABLET | Freq: Every day | ORAL | Status: DC
  Administered 2014-11-08 – 2014-11-09 (×2): 40 mg via ORAL
  Filled 2014-11-07 (×2): qty 1

## 2014-11-07 MED ORDER — DEXTROSE 5 % IV SOLN
1.0000 g | INTRAVENOUS | Status: DC
  Administered 2014-11-07 – 2014-11-08 (×2): 1 g via INTRAVENOUS
  Filled 2014-11-07 (×3): qty 10

## 2014-11-07 MED ORDER — ACETAMINOPHEN 650 MG RE SUPP: 650.0000 mg | Freq: Four times a day (QID) | RECTAL | Status: DC | PRN

## 2014-11-07 MED ORDER — IRBESARTAN 75 MG PO TABS
75.0000 mg | ORAL_TABLET | Freq: Every day | ORAL | Status: DC
  Administered 2014-11-07 – 2014-11-09 (×3): 75 mg via ORAL
  Filled 2014-11-07 (×3): qty 1

## 2014-11-07 MED ORDER — ASPIRIN EC 81 MG PO TBEC
81.0000 mg | DELAYED_RELEASE_TABLET | Freq: Every day | ORAL | Status: DC
  Administered 2014-11-07 – 2014-11-09 (×3): 81 mg via ORAL
  Filled 2014-11-07 (×3): qty 1

## 2014-11-07 MED ORDER — ENOXAPARIN SODIUM 40 MG/0.4ML ~~LOC~~ SOLN
40.0000 mg | SUBCUTANEOUS | Status: DC
  Administered 2014-11-07 – 2014-11-09 (×3): 40 mg via SUBCUTANEOUS
  Filled 2014-11-07 (×3): qty 0.4

## 2014-11-07 MED ORDER — DEXTROSE 5 % IV SOLN
1.0000 g | Freq: Once | INTRAVENOUS | Status: AC
  Administered 2014-11-07: 1 g via INTRAVENOUS
  Filled 2014-11-07: qty 10

## 2014-11-07 MED ORDER — OXYCODONE HCL 5 MG PO TABS: 15.0000 mg | ORAL_TABLET | Freq: Four times a day (QID) | ORAL | Status: DC | PRN

## 2014-11-07 MED ORDER — SODIUM CHLORIDE 0.9 % IV SOLN: 250.0000 mL | INTRAVENOUS | Status: DC | PRN

## 2014-11-07 MED ORDER — ONDANSETRON HCL 4 MG PO TABS: 4.0000 mg | ORAL_TABLET | Freq: Four times a day (QID) | ORAL | Status: DC | PRN

## 2014-11-07 MED ORDER — SODIUM CHLORIDE 0.9 % IJ SOLN
3.0000 mL | Freq: Two times a day (BID) | INTRAMUSCULAR | Status: DC
  Administered 2014-11-07 – 2014-11-09 (×6): 3 mL via INTRAVENOUS

## 2014-11-07 MED ORDER — DEXTROSE 5 % IV SOLN
500.0000 mg | Freq: Once | INTRAVENOUS | Status: AC
  Administered 2014-11-07: 500 mg via INTRAVENOUS
  Filled 2014-11-07: qty 500

## 2014-11-07 MED ORDER — DEXTROSE 5 % IV SOLN
500.0000 mg | INTRAVENOUS | Status: DC
  Administered 2014-11-07: 500 mg via INTRAVENOUS
  Filled 2014-11-07 (×2): qty 500

## 2014-11-07 MED ORDER — ARIPIPRAZOLE 10 MG PO TABS
10.0000 mg | ORAL_TABLET | Freq: Every day | ORAL | Status: DC
  Administered 2014-11-07 – 2014-11-08 (×2): 10 mg via ORAL
  Filled 2014-11-07 (×2): qty 1

## 2014-11-07 MED ORDER — ALPRAZOLAM 1 MG PO TABS
1.0000 mg | ORAL_TABLET | Freq: Three times a day (TID) | ORAL | Status: DC | PRN
  Administered 2014-11-07: 1 mg via ORAL
  Filled 2014-11-07: qty 1

## 2014-11-07 MED ORDER — SODIUM CHLORIDE 0.9 % IJ SOLN
3.0000 mL | INTRAMUSCULAR | Status: DC | PRN
  Administered 2014-11-07 – 2014-11-08 (×3): 3 mL via INTRAVENOUS
  Filled 2014-11-07 (×3): qty 10

## 2014-11-07 MED ORDER — PRAVASTATIN SODIUM 20 MG PO TABS
40.0000 mg | ORAL_TABLET | Freq: Every day | ORAL | Status: DC
  Administered 2014-11-07 – 2014-11-09 (×3): 40 mg via ORAL
  Filled 2014-11-07 (×3): qty 2

## 2014-11-07 MED ORDER — NICOTINE 21 MG/24HR TD PT24
21.0000 mg | MEDICATED_PATCH | Freq: Every day | TRANSDERMAL | Status: DC
  Administered 2014-11-07 – 2014-11-09 (×3): 21 mg via TRANSDERMAL
  Filled 2014-11-07 (×3): qty 1

## 2014-11-07 MED ORDER — BUDESONIDE-FORMOTEROL FUMARATE 160-4.5 MCG/ACT IN AERO
2.0000 | INHALATION_SPRAY | Freq: Two times a day (BID) | RESPIRATORY_TRACT | Status: DC
  Administered 2014-11-07 – 2014-11-09 (×5): 2 via RESPIRATORY_TRACT
  Filled 2014-11-07: qty 6

## 2014-11-07 MED ORDER — IPRATROPIUM-ALBUTEROL 0.5-2.5 (3) MG/3ML IN SOLN: 3.0000 mL | RESPIRATORY_TRACT | Status: DC | PRN

## 2014-11-07 MED ORDER — OXYCODONE HCL 5 MG PO TABS
15.0000 mg | ORAL_TABLET | Freq: Four times a day (QID) | ORAL | Status: DC | PRN
  Administered 2014-11-07 – 2014-11-09 (×5): 15 mg via ORAL
  Filled 2014-11-07 (×6): qty 3

## 2014-11-07 MED ORDER — TIOTROPIUM BROMIDE MONOHYDRATE 18 MCG IN CAPS
18.0000 ug | ORAL_CAPSULE | Freq: Every day | RESPIRATORY_TRACT | Status: DC
  Administered 2014-11-07 – 2014-11-09 (×3): 18 ug via RESPIRATORY_TRACT
  Filled 2014-11-07: qty 5

## 2014-11-07 MED ORDER — ACETAMINOPHEN 325 MG PO TABS: 650.0000 mg | ORAL_TABLET | Freq: Four times a day (QID) | ORAL | Status: DC | PRN

## 2014-11-07 MED ORDER — HYDROCODONE-ACETAMINOPHEN 5-325 MG PO TABS
1.0000 | ORAL_TABLET | ORAL | Status: DC | PRN
  Administered 2014-11-07: 2 via ORAL
  Administered 2014-11-07: 1 via ORAL
  Filled 2014-11-07: qty 1
  Filled 2014-11-07 (×2): qty 2

## 2014-11-07 MED ORDER — ONDANSETRON HCL 4 MG/2ML IJ SOLN: 4.0000 mg | Freq: Four times a day (QID) | INTRAMUSCULAR | Status: DC | PRN

## 2014-11-07 MED ORDER — METHYLPREDNISOLONE SODIUM SUCC 40 MG IJ SOLR
40.0000 mg | Freq: Four times a day (QID) | INTRAMUSCULAR | Status: DC
  Administered 2014-11-07 – 2014-11-08 (×6): 40 mg via INTRAVENOUS
  Filled 2014-11-07 (×6): qty 1

## 2014-11-07 MED ORDER — OXYCODONE HCL ER 20 MG PO T12A
20.0000 mg | EXTENDED_RELEASE_TABLET | Freq: Two times a day (BID) | ORAL | Status: DC
  Administered 2014-11-07 – 2014-11-09 (×5): 20 mg via ORAL
  Filled 2014-11-07 (×5): qty 1

## 2014-11-07 NOTE — ED Notes (Signed)
Im tenus injection given left deltoid, pt tolerated well

## 2014-11-07 NOTE — ED Notes (Signed)
Admitting MD at bedside.

## 2014-11-07 NOTE — ED Notes (Signed)
Pt put on bed pan/ urine collected

## 2014-11-07 NOTE — Progress Notes (Signed)
Pt request medication for acid reflux and a nicotine patch, Orders received, will continue to assess.

## 2014-11-07 NOTE — Evaluation (Signed)
Physical Therapy Evaluation Patient Details Name: Angela Daniel MRN: [id number] DOB: [birth year] Today's Date: 11/07/2014   History of Present Illness  Angela Daniel is a 59 y.o. female with a known history of COPD, congestive heart failure, sleep apnea, active smoker presents to the emergency room with the complaints of ongoing shortness of breath with cough with expectoration of yellow sputum for the past 1 week. Denies any chest pain, palpitations. Also noted to have mild confusion at home which later improved. Denies any fever, chills. Does have some nausea and one vomiting but no abdominal pain, dysuria or diarrhea. On arrival in the ED, patient was noted to have hypoxia with room air O2 saturations around 88%, alert awake and oriented 3 and stable vital signs. Workup revealed elevated WBC of 14.1 and chest x-ray significant for mild bibasilar airspace opacities concerning for atelectasis versus mild pneumonia. EKG sinus tachycardia with ventricular rate of 10 9 bpm, no acute ischemic changes. Patient was placed on oxygen supplementation, was started on IV antibiotics-ceftriaxone and azithromycin, IV Solu-Medrol and vigorous DuoNeb's. Hospitalist service was consulted for further management. Patient at the current time is resting in bed, not in any acute distress, sleepy but easily arousable and alert awake and oriented 3. Patient also gives a history of fall about 1 week ago with injury to left elbow and denies any head injury. X-ray of left elbow negative for acute trauma.  Clinical Impression  Pt is received semirecumbent in bed upon entry, somnolent but willing to participate. No acute distress noted, aside from chronic pain problems, although patient does not elaborate. Pt is A&Ox3 and pleasant. Pt balance demonstrating moderate impairment as evident in Berg Balance Score of 35/56, indicative of high falls risk. Pt strength as screened by functional mobility assessment demonstrating mild-moderate  generalized weakness in BLE. Pt ambulation is limited at this time due to significant balance impairments and level of arousal. Pt received on RA, as she continues to doff [place] at her own discretion, with SaO2 at 88% resting. [place] returned on 2L, where patient recovered to 94%. Pt is on 2L O2 at night at home. Pt reports ample familial support for D/C to home. Patient presenting with impairment of strength, balance, oxygen perfusion, arousal, and activity tolerance, limiting ability to perform ADL and mobility tasks at  baseline level of function. Patient will benefit from skilled intervention to address the above impairments and limitations, in order to restore to prior level of function, improve patient safety upon discharge, and to decrease falls risk.       Follow Up Recommendations Home health PT    Equipment Recommendations  None recommended by PT    Recommendations for Other Services       Precautions / Restrictions Precautions Precautions: None Restrictions Weight Bearing Restrictions: No      Mobility  Bed Mobility Overal bed mobility: Modified Independent             General bed mobility comments: Additional time required.   Transfers Overall transfer level: Needs assistance Equipment used: None Transfers: Sit to/from Stand Sit to Stand: Min guard         General transfer comment: Patient intermittently with eyes closed.   Ambulation/Gait         Gait velocity: Limited at this time due to level of arousal and poor O2 perfusion. Will progress at later date time, once pt is less somnolent.       Stairs  Wheelchair Mobility    Modified Rankin (Stroke Patients Only)       Balance Overall balance assessment: Needs assistance Sitting-balance support: No upper extremity supported;Feet supported Sitting balance-Leahy Scale: Good                           Standardized Balance Assessment Standardized Balance Assessment : Berg  Balance Test Berg Balance Test Sit to Stand: Able to stand  independently using hands Standing Unsupported: Able to stand 2 minutes with supervision Sitting with Back Unsupported but Feet Supported on Floor or Stool: Able to sit safely and securely 2 minutes Stand to Sit: Sits safely with minimal use of hands Transfers: Able to transfer safely, minor use of hands Standing Unsupported with Eyes Closed: Able to stand 10 seconds with supervision Standing Ubsupported with Feet Together: Able to place feet together independently but unable to hold for 30 seconds From Standing, Reach Forward with Outstretched Arm: Can reach forward >12 cm safely (5") From Standing Position, Pick up Object from Floor: Able to pick up shoe, needs supervision From Standing Position, Turn to Look Behind Over each Shoulder: Turn sideways only but maintains balance Turn 360 Degrees: Needs close supervision or verbal cueing Standing Unsupported, Alternately Place Feet on Step/Stool: Able to complete >2 steps/needs minimal assist Standing Unsupported, One Foot in Front: Needs help to step but can hold 15 seconds Standing on One Leg: Tries to lift leg/unable to hold 3 seconds but remains standing independently Total Score: 35         Pertinent Vitals/Pain Pain Assessment:  (Pt reports that her back and neck her, which are typical, but does nto further illiterate. )    Home Living Family/patient expects to be discharged to:: Private residence Living Arrangements: Children Available Help at Discharge: Family Type of Home: Mobile home       Home Layout: One level Home Equipment: [place] - single point;Wheelchair - manual      Prior Function Level of Independence: Independent with assistive device(s)         Comments: Uses [facility] intermittently for community distance AMB without limitations.      Hand Dominance        Extremity/Trunk Assessment   Upper Extremity Assessment: Overall WFL for tasks assessed            Lower Extremity Assessment: Generalized weakness (Moderate effort required to perform transfers, etc. )      Cervical / Trunk Assessment: Normal  Communication   Communication: Other (comment) (Pt is quite somnolent, talking as if she is half-alseep, howevere, she responds to most questions appropriately when sufficient time is allowed. )  Cognition Arousal/Alertness: Lethargic (RN reports she has been like this all day. ) Behavior During Therapy: Flat affect Overall Cognitive Status: No family/caregiver present to determine baseline cognitive functioning                      General Comments      Exercises        Assessment/Plan    PT Assessment Patient needs continued PT services  PT Diagnosis Difficulty walking;Abnormality of gait;Other (comment) (unsteadiness on feet. )   PT Problem List Decreased strength;Decreased range of motion;Decreased balance;Decreased mobility;Decreased knowledge of precautions (COntinues to remove [place], despite explained medical need. )  PT Treatment Interventions Gait training;Stair training;Therapeutic activities;Therapeutic exercise;Balance training   PT Goals (Current goals can be found in the Care Plan section) Acute Rehab PT Goals Patient  Stated Goal: Not be so tired, get back to feeling well.  PT Goal Formulation: With patient Time For Goal Achievement: 11/21/14 Potential to Achieve Goals: Good    Frequency Min 2X/week   Barriers to discharge        Co-evaluation               End of Session Equipment Utilized During Treatment: Gait belt Activity Tolerance: Patient tolerated treatment well;Patient limited by lethargy Patient left: in bed;with call bell/phone within reach;with bed alarm set;with family/visitor present Nurse Communication: Mobility status;Other (comment)         Time: 1610-9604: 1648-1702 PT Time Calculation (min) (ACUTE ONLY): 14 min   Charges:   PT Evaluation $Initial PT Evaluation Tier I: 1  Procedure     PT G Codes:        Buccola,Allan C 11/07/2014, 5:44 PM  5:49 PM  Rosamaria LintsAllan C Buccola, PT, DPT [place] License # [id number]

## 2014-11-07 NOTE — Progress Notes (Signed)
Unable to obtain a sputum sample at this time 

## 2014-11-07 NOTE — Care Management Note (Signed)
Case Management Note  Patient Details  Name: Priscille LovelessSara Jean Dial MRN: [id number] Date of Birth: 12/07/1955  Subjective/Objective:                 Patient presents from home with COPD exacerbation and bilateral PNA.  Patient lives at home with her daughter.  Patient states that she obtains her mediations at [facility] in [place].  Patient states that she does not have any issue obtaining her medications.  Patient states that she has a wheelchair at home, but does not use it.  Patient states that she wears 2 liters of O2 at night along with CPAP.  Patient has required acute O2 during this hospital admission.     Action/Plan: If patient requires additional O2 at home will need qualifying O2 sats.   Expected Discharge Date:                  Expected Discharge Plan:     In-House Referral:     Discharge planning Services     Post Acute Care Choice:    Choice offered to:     DME Arranged:    DME Agency:     HH Arranged:    HH Agency:     Status of Service:     Medicare Important Message Given:    Date Medicare IM Given:    Medicare IM give by:    Date Additional Medicare IM Given:    Additional Medicare Important Message give by:     If discussed at Long Length of Stay Meetings, dates discussed:    Additional Comments:  Chapman FitchBOWEN, Winner Valeriano T, RN 11/07/2014, 3:38 PM

## 2014-11-07 NOTE — Progress Notes (Signed)
ANTIBIOTIC CONSULT NOTE - INITIAL  Pharmacy Consult for ceftriaxone Indication: pneumonia  No Known Allergies  Patient Measurements:   Adjusted Body Weight:   Vital Signs: Temp: 98.7 F (37.1 C) (10/17 0235) Temp Source: Oral (10/17 0235) BP: 146/87 mmHg (10/17 0235) Pulse Rate: 108 (10/17 0235) Intake/Output from previous day:   Intake/Output from this shift:    Labs:  Recent Labs  11/06/14 2150  WBC 14.1*  HGB 11.9*  PLT 234  CREATININE 0.69   Estimated Creatinine Clearance: 61.1 mL/min (by C-G formula based on Cr of 0.69). No results for input(s): VANCOTROUGH, VANCOPEAK, VANCORANDOM, GENTTROUGH, GENTPEAK, GENTRANDOM, TOBRATROUGH, TOBRAPEAK, TOBRARND, AMIKACINPEAK, AMIKACINTROU, AMIKACIN in the last 72 hours.   Microbiology: No results found for this or any previous visit (from the past 720 hour(s)).  Medical History: Past Medical History  Diagnosis Date  . Fibromyalgia   . Degenerative joint disease   . Sleep apnea   . COPD (chronic obstructive pulmonary disease) (HCC)   . Congestive heart failure (HCC)     Medications:  Infusions:   Assessment: 59 yof cc AMS/cough/SOB/weakness/emesis x 1 week. Hypoxia in ED, WBC 14.1, CXR concerning for atelectasis vs mild pneumonia, starting CAP abx azithromycin and ceftriaxone.  Goal of Therapy:    Plan:  Azithromycin 500 mg IV Q24H and ceftriaxone 1 gm IV Q24H. Will continue to follow for clinical resolution and IV to PO conversion.  Carola FrostNathan A Nimah Uphoff, Pharm.D. Clinical Pharmacist 11/07/2014,2:36 AM

## 2014-11-07 NOTE — Plan of Care (Signed)
Problem: Acute Rehab PT Goals(only PT should resolve) Goal: Patient Will Transfer Sit To/From Stand Pt will transfer sit to/from-stand 5 times without AD at supervision without loss-of-balance in less than 18 seconds to demonstrate progress toward return of PLOF in functional strength for safe D/C home.  Goal: Pt Will Ambulate Pt will ambulate with RW at Supervision on RA with SaO2>89% for distances greater than 24700ft to demonstrate the ability to perform safe household distance ambulation at discharge.    Goal: PT Additional Goal #1 Pt will score greater than 45/56 on berg Balance Test to demonstrate improvement in balance for safe D/C home.

## 2014-11-07 NOTE — ED Notes (Signed)
Pt attempted to contact family member with assist of RN in order to obtain med list, which was taken home by family member, pt unable to contact family member, voicemail left at this time. Pt unaware of full med list at this time.

## 2014-11-07 NOTE — H&P (Signed)
[facility] [place] [facility]   PATIENT NAME: Angela CarbonSara Daniel    MR#:  [id number]  DATE OF BIRTH:  10/13/1955  DATE OF ADMISSION:  11/06/2014  PRIMARY CARE PHYSICIAN: Pcp Not In System Dr. Lacie ScottsNiemeyer REQUESTING/REFERRING PHYSICIAN: [place]  CHIEF COMPLAINT:   Chief Complaint  Patient presents with  . Altered Mental Status  . Cough  . Shortness of Breath  . Weakness  . Emesis    HISTORY OF PRESENT ILLNESS:  Angela Daniel  is a 59 y.o. female with a known history of COPD, congestive heart failure, sleep apnea, active smoker presents to the emergency room with the complaints of ongoing shortness of breath with cough with expectoration of yellow sputum for the past 1 week. Denies any chest pain, palpitations. Also noted to have mild confusion at home which later improved. Denies any fever, chills. Does have some nausea and one vomiting but no abdominal pain, dysuria or diarrhea. On arrival in the ED, patient was noted to have hypoxia with room air O2 saturations around 88%, alert awake and oriented 3 and stable vital signs. Workup revealed elevated WBC of 14.1 and chest x-ray significant for mild bibasilar airspace opacities concerning for atelectasis versus mild pneumonia. EKG sinus tachycardia with ventricular rate of 10 9 bpm, no acute ischemic changes. Patient was placed on oxygen supplementation, was started on IV antibiotics-ceftriaxone and azithromycin, IV Solu-Medrol and vigorous DuoNeb's. Hospitalist service was consulted for further management. Patient at the current time is resting in bed, not in any acute distress, sleepy but easily arousable and alert awake and oriented 3. Patient also gives a history of fall about 1 week ago with injury to left elbow and denies any head injury. X-ray of left elbow negative for acute trauma.  PAST MEDICAL HISTORY:   Past Medical History  Diagnosis Date  . Fibromyalgia   . Degenerative joint disease   . Sleep apnea   .  COPD (chronic obstructive pulmonary disease) (HCC)   . Congestive heart failure (HCC)     PAST SURGICAL HISTORY:   Past Surgical History  Procedure Laterality Date  . Hip surgery Left   . Cesarean section    . Abdominal hysterectomy    . Cholecystectomy    . Appendectomy      SOCIAL HISTORY:   Social History  Substance Use Topics  . Smoking status: Current Every Day Smoker -- 0.50 packs/day    Types: Cigarettes  . Smokeless tobacco: Not on file  . Alcohol Use: No    FAMILY HISTORY:   Family History  Problem Relation Age of Onset  . CVA Mother   . Lung cancer Mother   . Diabetes Mother   . Heart attack Father   . Hypertension Father   . Lung cancer Father   . Asthma Father     DRUG ALLERGIES:  No Known Allergies  REVIEW OF SYSTEMS:   Review of Systems  Constitutional: Positive for malaise/fatigue. Negative for fever and chills.  HENT: Negative for ear pain, hearing loss, nosebleeds, sore throat and tinnitus.   Eyes: Negative for blurred vision, double vision, pain, discharge and redness.  Respiratory: Positive for cough, sputum production and shortness of breath. Negative for hemoptysis and wheezing.   Cardiovascular: Negative for chest pain, palpitations, orthopnea and leg swelling.  Gastrointestinal: Positive for nausea and vomiting. Negative for abdominal pain, diarrhea, constipation, blood in stool and melena.  Genitourinary: Negative for dysuria, urgency, frequency and hematuria.  Musculoskeletal: Positive for joint pain.  Negative for back pain and neck pain.  Skin: Negative for itching and rash.  Neurological: Negative for dizziness, tingling, sensory change, focal weakness and seizures.  Endo/Heme/Allergies: Does not bruise/bleed easily.  Psychiatric/Behavioral: Negative for depression. The patient is not nervous/anxious.     MEDICATIONS AT HOME:   Prior to Admission medications   Not on File      VITAL SIGNS:  Blood pressure 131/79, pulse 105,  temperature 98.8 F (37.1 C), temperature source Oral, resp. rate 16, SpO2 95 %.  PHYSICAL EXAMINATION:  Physical Exam  Constitutional: She is oriented to person, place, and time. She appears well-developed and well-nourished. No distress.  HENT:  Head: Normocephalic and atraumatic.  Right Ear: External ear normal.  Left Ear: External ear normal.  Nose: Nose normal.  Mouth/Throat: Oropharynx is clear and moist. No oropharyngeal exudate.  Eyes: EOM are normal. Pupils are equal, round, and reactive to light. No scleral icterus.  Neck: Normal range of motion. Neck supple. No JVD present. No thyromegaly present.  Cardiovascular: Normal rate, regular rhythm, normal heart sounds and intact distal pulses.  Exam reveals no friction rub.   No murmur heard. Respiratory: Effort normal. No respiratory distress. She has wheezes. She has rales. She exhibits no tenderness.  GI: Soft. Bowel sounds are normal. She exhibits no distension and no mass. There is no tenderness. There is no rebound and no guarding.  Musculoskeletal: Normal range of motion. She exhibits no edema.  Lymphadenopathy:    She has no cervical adenopathy.  Neurological: She is alert and oriented to person, place, and time. She has normal reflexes. She displays normal reflexes. No cranial nerve deficit. She exhibits normal muscle tone.  Skin: Skin is warm. No rash noted. No erythema.  Dressing over left elbow +  Psychiatric: She has a normal mood and affect. Her behavior is normal. Thought content normal.   LABORATORY PANEL:   CBC  Recent Labs Lab 11/06/14 2150  WBC 14.1*  HGB 11.9*  HCT 34.7*  PLT 234   ------------------------------------------------------------------------------------------------------------------  Chemistries   Recent Labs Lab 11/06/14 2150  NA 138  K 3.6  CL 100*  CO2 32  GLUCOSE 96  BUN 16  CREATININE 0.69  CALCIUM 8.7*    ------------------------------------------------------------------------------------------------------------------  Cardiac Enzymes  Recent Labs Lab 11/06/14 2150  TROPONINI <0.03   ------------------------------------------------------------------------------------------------------------------  RADIOLOGY:  Dg Chest 2 View  11/07/2014  CLINICAL DATA:  Acute onset of productive cough, shortness of breath and confusion. Initial encounter. EXAM: CHEST  2 VIEW COMPARISON:  Chest radiograph performed 06/2012 FINDINGS: The lungs are well-aerated. Mild bibasilar airspace opacities may reflect atelectasis or possibly mild pneumonia. There is no evidence of pleural effusion or pneumothorax. The heart is mildly enlarged. No acute osseous abnormalities are seen. Clips are noted within the right upper quadrant, reflecting prior cholecystectomy. IMPRESSION: Mild bibasilar airspace opacities may reflect atelectasis or possibly mild pneumonia. Mild cardiomegaly noted. Electronically Signed   By: Roanna Raider M.D.   On: 11/07/2014 00:18   Dg Elbow Complete Left  11/07/2014  CLINICAL DATA:  Left elbow pain after trip and fall 1 week prior. Tripped over fire place. EXAM: LEFT ELBOW - COMPLETE 3+ VIEW COMPARISON:  None. FINDINGS: No fracture or dislocation. The alignment is maintained. Mild degenerative change of the ulnar trochlear articulation. No elbow joint effusion. No focal soft tissue abnormality. IMPRESSION: Mild degenerative change of the elbow.  No acute bony abnormality. Electronically Signed   By: Rubye Oaks M.D.   On: 11/07/2014 00:19  EKG:   Orders placed or performed in visit on 06/28/12  . EKG 12-Lead  Sinus tachycardia with ventricular rate of 109 beats per red, no acute ischemic changes.  IMPRESSION AND PLAN:   59 year old female with history of COPD, active smoker, sleep apnea, history of congestive heart failure presents with the complaints of shortness of breath with  cough with expectoration of yellow sputum ongoing for the past 1 week, found to have COPD exacerbation and chest x-ray significant for by basilar airspace opacities concerning for pneumonia. 1. COPD exacerbation secondary to bilateral pneumonia and continued smoking. 2. Bibasilar infiltrates on chest x-ray concerning for pneumonia. History of cough with expectoration of 1 week duration, elevated WBC. Community-acquired pneumonia. 3. Acute respiratory failure with hypoxia secondary to above. Plan: Admit, continue O2 supplementation, follow-up O2 sats, IV Solu-Medrol, vigorous DuoNeb's, IV antibiotics-ceftriaxone and azithromycin. Continue home medications-Spiriva and Symbicort. 4. 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 Obtain list of home medications and consider restarting accordingly. 5. Sleep apnea, on CPAP. Continue same. 6. History of congestive heart failure, stable clinically. Monitor.  DVT prophylaxis: Subcutaneous Lovenox. Obtain list of home medications and restart accordingly.   All the records are reviewed and case discussed with ED provider. Management plans discussed with the patient and  in agreement.  CODE STATUS: Full code  TOTAL TIME TAKING CARE OF THIS PATIENT: 50 minutes.    Jonnie Kind N M.D on 11/07/2014 at 1:21 AM  Between 7am to 6pm - Pager - [phone]  After 6pm go to [email or web address] - password [email or web address]  [facility]  Office  [phone]  CC: Primary care physician; Pcp Not In System

## 2014-11-08 LAB — EXPECTORATED SPUTUM ASSESSMENT W REFEX TO RESP CULTURE

## 2014-11-08 LAB — EXPECTORATED SPUTUM ASSESSMENT W GRAM STAIN, RFLX TO RESP C

## 2014-11-08 MED ORDER — AZITHROMYCIN 250 MG PO TABS
500.0000 mg | ORAL_TABLET | Freq: Every day | ORAL | Status: DC
  Administered 2014-11-08: 500 mg via ORAL
  Filled 2014-11-08: qty 2

## 2014-11-08 MED ORDER — AMPHETAMINE-DEXTROAMPHETAMINE 20 MG PO TABS: 30.0000 mg | ORAL_TABLET | Freq: Two times a day (BID) | ORAL | Status: DC

## 2014-11-08 MED ORDER — INFLUENZA VAC SPLIT QUAD 0.5 ML IM SUSY
0.5000 mL | PREFILLED_SYRINGE | INTRAMUSCULAR | Status: AC
  Administered 2014-11-09: 0.5 mL via INTRAMUSCULAR
  Filled 2014-11-08: qty 0.5

## 2014-11-08 MED ORDER — PREDNISONE 50 MG PO TABS
60.0000 mg | ORAL_TABLET | Freq: Every day | ORAL | Status: DC
  Administered 2014-11-09: 60 mg via ORAL
  Filled 2014-11-08: qty 1

## 2014-11-08 MED ORDER — AMPHETAMINE-DEXTROAMPHETAMINE 5 MG PO TABS
30.0000 mg | ORAL_TABLET | Freq: Two times a day (BID) | ORAL | Status: DC
  Administered 2014-11-08 – 2014-11-09 (×2): 30 mg via ORAL
  Filled 2014-11-08 (×2): qty 6

## 2014-11-08 MED ORDER — ARIPIPRAZOLE 10 MG PO TABS
15.0000 mg | ORAL_TABLET | Freq: Every day | ORAL | Status: DC
  Administered 2014-11-09: 15 mg via ORAL
  Filled 2014-11-08: qty 2

## 2014-11-08 NOTE — Clinical Documentation Improvement (Signed)
Hospitalist  Can the diagnosis of altered mental status be further specified?   Confusion/delirium (including drug induced)  Transient alteration of awareness  Encephalopathy - Alcoholic, Anoxic/Hypoxia, Drug Induced/Toxic (specify drug), Hepatic, Hypertensive, Hypoglycemic, Metabolic/Septic, Traumatic/post concussive, Wernicke, Other  Other  Clinically Undetermined  Document any associated diagnoses/conditions.   Supporting Information: Patient presents with AMS per 10/17 progress notes.   Please exercise your independent, professional judgment when responding. A specific answer is not anticipated or expected.   Thank Angela DonovanYou,  Alben Jepsen [facility] Management [place] [phone]

## 2014-11-08 NOTE — Progress Notes (Signed)
[facility] [place] at [facility]   PATIENT NAME: Angela CarbonSara Daniel    MR#:  [id number]  DATE OF BIRTH:  11/24/1955  SUBJECTIVE:  CHIEF COMPLAINT:   Chief Complaint  Patient presents with  . Altered Mental Status  . Cough  . Shortness of Breath  . Weakness  . Emesis     Feels better, still some wheezing.  REVIEW OF SYSTEMS:  CONSTITUTIONAL: No fever, fatigue or weakness.  EYES: No blurred or double vision.  EARS, NOSE, AND THROAT: No tinnitus or ear pain.  RESPIRATORY: positive for cough, shortness of breath, wheezing , No hemoptysis.  CARDIOVASCULAR: No chest pain, orthopnea, edema.  GASTROINTESTINAL: No nausea, vomiting, diarrhea or abdominal pain.  GENITOURINARY: No dysuria, hematuria.  ENDOCRINE: No polyuria, nocturia,  HEMATOLOGY: No anemia, easy bruising or bleeding SKIN: No rash or lesion. MUSCULOSKELETAL: No joint pain or arthritis.   NEUROLOGIC: No tingling, numbness, weakness.  PSYCHIATRY: No anxiety or depression.   ROS  DRUG ALLERGIES:  No Known Allergies  VITALS:  Blood pressure 124/59, pulse 66, temperature 98.1 F (36.7 C), temperature source Oral, resp. rate 20, height 5\' 3"  (1.6 m), weight 60.873 kg (134 lb 3.2 oz), SpO2 95 %.  PHYSICAL EXAMINATION:  GENERAL:  59 y.o.-year-old patient lying in the bed with no acute distress.  EYES: Pupils equal, round, reactive to light and accommodation. No scleral icterus. Extraocular muscles intact.  HEENT: Head atraumatic, normocephalic. Oropharynx and nasopharynx clear.  NECK:  Supple, no jugular venous distention. No thyroid enlargement, no tenderness.  LUNGS: Normal breath sounds bilaterally, positive for wheezing,No crepitation. No use of accessory muscles of respiration.  CARDIOVASCULAR: S1, S2 normal. No murmurs, rubs, or gallops.  ABDOMEN: Soft, nontender, nondistended. Bowel sounds present. No organomegaly or mass.  EXTREMITIES: No pedal edema, cyanosis, or clubbing.  NEUROLOGIC:  Cranial nerves II through XII are intact. Muscle strength 5/5 in all extremities. Sensation intact. Gait not checked.  PSYCHIATRIC: The patient is alert and oriented x 3.  SKIN: No obvious rash, lesion, or ulcer.   Physical Exam LABORATORY PANEL:   CBC  Recent Labs Lab 11/07/14 0540  WBC 11.9*  HGB 11.6*  HCT 34.4*  PLT 251   ------------------------------------------------------------------------------------------------------------------  Chemistries   Recent Labs Lab 11/06/14 2150  NA 138  K 3.6  CL 100*  CO2 32  GLUCOSE 96  BUN 16  CREATININE 0.69  CALCIUM 8.7*   ------------------------------------------------------------------------------------------------------------------  Cardiac Enzymes  Recent Labs Lab 11/06/14 2150  TROPONINI <0.03   ------------------------------------------------------------------------------------------------------------------  RADIOLOGY:  Dg Chest 2 View  11/07/2014  CLINICAL DATA:  Acute onset of productive cough, shortness of breath and confusion. Initial encounter. EXAM: CHEST  2 VIEW COMPARISON:  Chest radiograph performed 06/2012 FINDINGS: The lungs are well-aerated. Mild bibasilar airspace opacities may reflect atelectasis or possibly mild pneumonia. There is no evidence of pleural effusion or pneumothorax. The heart is mildly enlarged. No acute osseous abnormalities are seen. Clips are noted within the right upper quadrant, reflecting prior cholecystectomy. IMPRESSION: Mild bibasilar airspace opacities may reflect atelectasis or possibly mild pneumonia. Mild cardiomegaly noted. Electronically Signed   By: Roanna RaiderJeffery  Chang M.D.   On: 11/07/2014 00:18   Dg Elbow Complete Left  11/07/2014  CLINICAL DATA:  Left elbow pain after trip and fall 1 week prior. Tripped over fire place. EXAM: LEFT ELBOW - COMPLETE 3+ VIEW COMPARISON:  None. FINDINGS: No fracture or dislocation. The alignment is maintained. Mild degenerative change of the  ulnar trochlear articulation. No elbow  joint effusion. No focal soft tissue abnormality. IMPRESSION: Mild degenerative change of the elbow.  No acute bony abnormality. Electronically Signed   By: Rubye Oaks M.D.   On: 11/07/2014 00:19    ASSESSMENT AND PLAN:   Principal Problem:   COPD exacerbation (HCC) Active Problems:   Acute respiratory failure with hypoxia (HCC)   CAP (community acquired pneumonia)  59 year old female with history of COPD, active smoker, sleep apnea, history of congestive heart failure presents with the complaints of shortness of breath with cough with expectoration of yellow sputum ongoing for the past 1 week, found to have COPD exacerbation and chest x-ray significant for by basilar airspace opacities concerning for pneumonia. 1. COPD exacerbation secondary to bilateral pneumonia and continued smoking.     IV steroids, nebs, ABX 2. Bibasilar infiltrates on chest x-ray concerning for pneumonia. History of cough with expectoration of 1 week duration, elevated WBC. Community-acquired pneumonia. ABx, Incentive spirometry. 3. Acute respiratory failure with hypoxia secondary to above.  continue O2 supplementation, follow-up O2 sats, IV Solu-Medrol, vigorous DuoNeb's, IV antibiotics-ceftriaxone and azithromycin. Continue home medications-Spiriva and Symbicort. 4. 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 Obtained list of home medications and  Restarted Long and short acting oxycodone. 5. Sleep apnea, on CPAP. Continue same. 6. History of congestive heart failure, stable clinically. Monitor.    All the records are reviewed and case discussed with Care Management/Social Workerr. Management plans discussed with the patient, family and they are in agreement.  CODE STATUS: full  TOTAL TIME TAKING CARE OF THIS PATIENT: 35 minutes.    POSSIBLE D/C IN 1-2 DAYS, DEPENDING ON CLINICAL CONDITION.   Altamese Dilling M.D on 11/08/2014    Between 7am to 6pm - Pager - [phone]  After 6pm go to [email or web address] - password [email or web address]  [facility]  Office  [phone]  CC: Primary care physician; Pcp Not In System  Note: This dictation was prepared with Dragon dictation along with smaller phrase technology. Any transcriptional errors that result from this process are unintentional.

## 2014-11-08 NOTE — Progress Notes (Signed)
[facility] [place] [facility]   PATIENT NAME: Angela CarbonSara Daniel    MR#:  [id number]  DATE OF BIRTH:  10/19/1955  SUBJECTIVE:  CHIEF COMPLAINT:   Chief Complaint  Patient presents with  . Altered Mental Status  . Cough  . Shortness of Breath  . Weakness  . Emesis     Feels better, still some wheezing. Have headache and sleepiness.  REVIEW OF SYSTEMS:  CONSTITUTIONAL: No fever, fatigue or weakness.  EYES: No blurred or double vision.  EARS, NOSE, AND THROAT: No tinnitus or ear pain.  RESPIRATORY: positive for cough, shortness of breath, wheezing , No hemoptysis.  CARDIOVASCULAR: No chest pain, orthopnea, edema.  GASTROINTESTINAL: No nausea, vomiting, diarrhea or abdominal pain.  GENITOURINARY: No dysuria, hematuria.  ENDOCRINE: No polyuria, nocturia,  HEMATOLOGY: No anemia, easy bruising or bleeding SKIN: No rash or lesion. MUSCULOSKELETAL: No joint pain or arthritis.   NEUROLOGIC: No tingling, numbness, weakness.  PSYCHIATRY: No anxiety or depression.   ROS  DRUG ALLERGIES:  No Known Allergies  VITALS:  Blood pressure 125/53, pulse 70, temperature 98.2 F (36.8 C), temperature source Oral, resp. rate 20, height 5\' 3"  (1.6 m), weight 60.873 kg (134 lb 3.2 oz), SpO2 94 %.  PHYSICAL EXAMINATION:  GENERAL:  59 y.o.-year-old patient lying in the bed with no acute distress.  EYES: Pupils equal, round, reactive to light and accommodation. No scleral icterus. Extraocular muscles intact.  HEENT: Head atraumatic, normocephalic. Oropharynx and nasopharynx clear.  NECK:  Supple, no jugular venous distention. No thyroid enlargement, no tenderness.  LUNGS: Normal breath sounds bilaterally, positive for wheezing,No crepitation. No use of accessory muscles of respiration.  CARDIOVASCULAR: S1, S2 normal. No murmurs, rubs, or gallops.  ABDOMEN: Soft, nontender, nondistended. Bowel sounds present. No organomegaly or mass.  EXTREMITIES: No pedal edema, cyanosis,  or clubbing.  NEUROLOGIC: Cranial nerves II through XII are intact. Muscle strength 5/5 in all extremities. Sensation intact. Gait not checked.  PSYCHIATRIC: The patient is alert and oriented x 3.  SKIN: No obvious rash, lesion, or ulcer.   Physical Exam LABORATORY PANEL:   CBC  Recent Labs Lab 11/07/14 0540  WBC 11.9*  HGB 11.6*  HCT 34.4*  PLT 251   ------------------------------------------------------------------------------------------------------------------  Chemistries   Recent Labs Lab 11/06/14 2150  NA 138  K 3.6  CL 100*  CO2 32  GLUCOSE 96  BUN 16  CREATININE 0.69  CALCIUM 8.7*   ------------------------------------------------------------------------------------------------------------------  Cardiac Enzymes  Recent Labs Lab 11/06/14 2150  TROPONINI <0.03   ------------------------------------------------------------------------------------------------------------------  RADIOLOGY:  Dg Chest 2 View  11/07/2014  CLINICAL DATA:  Acute onset of productive cough, shortness of breath and confusion. Initial encounter. EXAM: CHEST  2 VIEW COMPARISON:  Chest radiograph performed 06/2012 FINDINGS: The lungs are well-aerated. Mild bibasilar airspace opacities may reflect atelectasis or possibly mild pneumonia. There is no evidence of pleural effusion or pneumothorax. The heart is mildly enlarged. No acute osseous abnormalities are seen. Clips are noted within the right upper quadrant, reflecting prior cholecystectomy. IMPRESSION: Mild bibasilar airspace opacities may reflect atelectasis or possibly mild pneumonia. Mild cardiomegaly noted. Electronically Signed   By: Roanna RaiderJeffery  Chang M.D.   On: 11/07/2014 00:18   Dg Elbow Complete Left  11/07/2014  CLINICAL DATA:  Left elbow pain after trip and fall 1 week prior. Tripped over fire place. EXAM: LEFT ELBOW - COMPLETE 3+ VIEW COMPARISON:  None. FINDINGS: No fracture or dislocation. The alignment is maintained. Mild  degenerative change of the ulnar  trochlear articulation. No elbow joint effusion. No focal soft tissue abnormality. IMPRESSION: Mild degenerative change of the elbow.  No acute bony abnormality. Electronically Signed   By: Rubye Oaks M.D.   On: 11/07/2014 00:19    ASSESSMENT AND PLAN:   Principal Problem:   COPD exacerbation (HCC) Active Problems:   Acute respiratory failure with hypoxia (HCC)   CAP (community acquired pneumonia)  59 year old female with history of COPD, active smoker, sleep apnea, history of congestive heart failure presents with the complaints of shortness of breath with cough with expectoration of yellow sputum ongoing for the past 1 week, found to have COPD exacerbation and chest x-ray significant for by basilar airspace opacities concerning for pneumonia. 1. COPD exacerbation secondary to bilateral pneumonia and continued smoking.     IV steroids, nebs, ABX    Obtained sputum culture. 2. Bibasilar infiltrates on chest x-ray concerning for pneumonia. History of cough with expectoration of 1 week duration, elevated WBC. Community-acquired pneumonia. ABx, Incentive spirometry. 3. Acute respiratory failure with hypoxia secondary to above.  continue O2 supplementation, follow-up O2 sats, IV Solu-Medrol, vigorous DuoNeb's, IV antibiotics-ceftriaxone and azithromycin. Continue home medications-Spiriva and Symbicort. 4. 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 Obtained list of home medications and  Restarted Long and short acting oxycodone. 5. Sleep apnea, on CPAP. Continue same. 6. History of congestive heart failure, stable clinically. Monitor. 7. Hyperlipidemia    Continue pravastatin 8. Depression    Continue abilify and Adderall.  All the records are reviewed and case discussed with Care Management/Social Workerr. Management plans discussed with the patient, family and they are in agreement.  CODE STATUS: full  TOTAL TIME TAKING CARE  OF THIS PATIENT: 35 minutes.    POSSIBLE D/C IN 1-2 DAYS, DEPENDING ON CLINICAL CONDITION.   Altamese Dilling M.D on 11/08/2014   Between 7am to 6pm - Pager - [phone]  After 6pm go to [email or web address] - password [email or web address]  [facility]  Office  [phone]  CC: Primary care physician; Pcp Not In System  Note: This dictation was prepared with Dragon dictation along with smaller phrase technology. Any transcriptional errors that result from this process are unintentional.

## 2014-11-08 NOTE — Progress Notes (Signed)
Physical Therapy Treatment Patient Details Name: Angela Daniel MRN: [id number] DOB: 04/10/1955 Today's Date: 11/08/2014    History of Present Illness Angela Daniel is a 59 y.o. female with a known history of COPD, congestive heart failure, sleep apnea, active smoker presents to the emergency room with the complaints of ongoing shortness of breath with cough with expectoration of yellow sputum for the past 1 week. Denies any chest pain, palpitations. Also noted to have mild confusion at home which later improved. Denies any fever, chills. Does have some nausea and one vomiting but no abdominal pain, dysuria or diarrhea. On arrival in the ED, patient was noted to have hypoxia with room air O2 saturations around 88%, alert awake and oriented 3 and stable vital signs. Workup revealed elevated WBC of 14.1 and chest x-ray significant for mild bibasilar airspace opacities concerning for atelectasis versus mild pneumonia. EKG sinus tachycardia with ventricular rate of 10 9 bpm, no acute ischemic changes. Patient was placed on oxygen supplementation, was started on IV antibiotics-ceftriaxone and azithromycin, IV Solu-Medrol and vigorous DuoNeb's. Hospitalist service was consulted for further management. Patient at the current time is resting in bed, not in any acute distress, sleepy but easily arousable and alert awake and oriented 3. Patient also gives a history of fall about 1 week ago with injury to left elbow and denies any head injury. X-ray of left elbow negative for acute trauma.    PT Comments    Pt progressing towards goals today with greater ambulation distance and relatively little fatigue during ambulation (reference ambulation section for O2 saturations). She is fairly stable with mobility and without assistive device, however previous Berg Balance score indicates that pt is at risk for falling. Due to her cardiopulmonary status as well her balance deficits, she will benefit from both skilled  outpatient PT and pulmonary rehabilitation to assist with her breathing in order for her to return to optimal PLOF.   Follow Up Recommendations  Outpatient PT [facility](Pulmlonary rehabilitation)     Equipment Recommendations  None recommended by PT    Recommendations for Other Services       Precautions / Restrictions Precautions Precautions: None Restrictions Weight Bearing Restrictions: No    Mobility  Bed Mobility Overal bed mobility: Independent             General bed mobility comments: Pt able to perform bed mobility with no assist and good functional strength  Transfers Overall transfer level: Needs assistance Equipment used: None Transfers: Sit to/from Stand Sit to Stand: Min guard         General transfer comment: Pt with good pushup off of bed and stability in standing. Good control with transfer  Ambulation/Gait Ambulation/Gait assistance: Supervision Ambulation Distance (Feet): 220 Feet Assistive device: None Gait Pattern/deviations: WFL(Within Functional Limits) Gait velocity: decreased Gait velocity interpretation: Below normal speed for age/gender General Gait Details: Pt at rest on room air was at 90% O2 sat, therefore placed on 2L [place] O2 for ambulation. Pt never fell below 90% during ambulation and recovered to 93% O2 sat on room air following ambulation. Pt left on room air and nurse notifed of this. Pt stated that she never felt short of breath with any ambulation or mobility    Stairs            Wheelchair Mobility    Modified Rankin (Stroke Patients Only)       Balance Overall balance assessment: History of Falls  Cognition Arousal/Alertness: Awake/alert Behavior During Therapy: WFL for tasks assessed/performed Overall Cognitive Status: Within Functional Limits for tasks assessed                      Exercises      General Comments        Pertinent Vitals/Pain Pain  Assessment: No/denies pain    Home Living                      Prior Function            PT Goals (current goals can now be found in the care plan section) Acute Rehab PT Goals Patient Stated Goal: to get up and walk PT Goal Formulation: With patient Time For Goal Achievement: 11/21/14 Potential to Achieve Goals: Good    Frequency  Min 2X/week    PT Plan Discharge plan needs to be updated    Co-evaluation             End of Session Equipment Utilized During Treatment: Gait belt Activity Tolerance: Patient tolerated treatment well Patient left: in bed;with call bell/phone within reach;with bed alarm set;with family/visitor present     Time: 1610-9604 PT Time Calculation (min) (ACUTE ONLY): 11 min  Charges:                       G CodesBenna Dunks 11-10-14, 1:45 PM  Benna Dunks, SPT. [phone]

## 2014-11-08 NOTE — Clinical Documentation Improvement (Signed)
Hospitalist  Can the diagnosis of CHF be further specified?    Acuity - Acute, Chronic, Acute on Chronic   Type - Systolic, Diastolic, Systolic and Diastolic  Other  Clinically Undetermined   Document any associated diagnoses/conditions   Supporting Information: Patient with history of CHF per 10/17 progress notes.   Please exercise your independent, professional judgment when responding. A specific answer is not anticipated or expected.   Thank Angela DonovanYou,  Angela Daniel Health Information Management  [phone]

## 2014-11-09 DIAGNOSIS — J189 Pneumonia, unspecified organism: Secondary | ICD-10-CM | POA: Diagnosis not present

## 2014-11-09 LAB — CREATININE, SERUM
Creatinine, Ser: 0.68 mg/dL (ref 0.44–1.00)
GFR calc non Af Amer: >60 mL/min (ref >60)

## 2014-11-09 MED ORDER — PRAVASTATIN SODIUM 40 MG PO TABS: 40.0000 mg | ORAL_TABLET | Freq: Every day | ORAL | Status: DC

## 2014-11-09 MED ORDER — CEFUROXIME AXETIL 250 MG PO TABS: 250.0000 mg | ORAL_TABLET | Freq: Two times a day (BID) | ORAL | Status: AC

## 2014-11-09 MED ORDER — AZITHROMYCIN 250 MG PO TABS: 250.0000 mg | ORAL_TABLET | Freq: Every day | ORAL | Status: AC

## 2014-11-09 MED ORDER — PREDNISONE 10 MG (21) PO TBPK: ORAL_TABLET | ORAL | Status: DC

## 2014-11-09 NOTE — Care Management Important Message (Signed)
Important Message  Patient Details  Name: Angela Daniel MRN: [id number] Date of Birth: 01/03/1956   Medicare Important Message Given:  Yes-second notification given    Olegario MessierKathy A Allmond 11/09/2014, 10:01 AM

## 2014-11-09 NOTE — Progress Notes (Signed)
Pt refused to wear cpap. Pt stated "I don't like it." Nurse notified. No distress noted.

## 2014-11-09 NOTE — Discharge Instructions (Signed)
CALL [phone] TO FOLLOW UP ON APPOINTMENT FOR OUTPATIENT PULMONARY REHAB  ([facility]) AND TO FOLLOW UP ON OUTPATIENT PHYSICAL THERAPY APPOINTMENT

## 2014-11-09 NOTE — Discharge Summary (Signed)
[facility] - [place] at [facility]   PATIENT NAME: Angela Daniel    MR#:  [id number]  DATE OF BIRTH:  05/02/1955  DATE OF ADMISSION:  11/06/2014 ADMITTING PHYSICIAN: Crissie FiguresEdavally N Reddy, MD  DATE OF DISCHARGE: 11/09/2014  PRIMARY CARE PHYSICIAN: Pcp Not In System    ADMISSION DIAGNOSIS:  Community acquired pneumonia [J18.9] COPD with acute exacerbation (HCC) [J44.1]  DISCHARGE DIAGNOSIS:  Principal Problem:   COPD exacerbation (HCC) Active Problems:   Acute respiratory failure with hypoxia (HCC)   CAP (community acquired pneumonia)   SECONDARY DIAGNOSIS:   Past Medical History  Diagnosis Date  . Fibromyalgia   . Degenerative joint disease   . Sleep apnea   . COPD (chronic obstructive pulmonary disease) (HCC)   . Congestive heart failure [facility](HCC)     HOSPITAL COURSE:   59 year old female with history of COPD, active smoker, sleep apnea, history of congestive heart failure presents with the complaints of shortness of breath with cough with expectoration of yellow sputum ongoing for the past 1 week, found to have COPD exacerbation and chest x-ray significant for by basilar airspace opacities concerning for pneumonia. 1. COPD exacerbation secondary to bilateral pneumonia and continued smoking.  IV steroids, nebs, ABX  Obtained sputum culture. 2. Bibasilar infiltrates on chest x-ray concerning for pneumonia. History of cough with expectoration of 1 week duration, elevated WBC. Community-acquired pneumonia. ABx, Incentive spirometry.   Pt had significant improvement- and she is able to walk without trouble/ SOB. 3. Acute respiratory failure with hypoxia secondary to above. continue O2 supplementation, follow-up O2 sats, IV Solu-Medrol, vigorous DuoNeb's, IV antibiotics-ceftriaxone and azithromycin. Continue home medications-Spiriva and Symbicort. 4. 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 Obtained list of home medications  and Restarted Long and short acting oxycodone. 5. Sleep apnea, on CPAP. Continue same. 6. History of congestive heart failure, stable clinically. Monitor. 7. Hyperlipidemia  Continue pravastatin 8. Depression  Continue abilify and Adderall.  DISCHARGE CONDITIONS:   Stable.  CONSULTS OBTAINED:     DRUG ALLERGIES:  No Known Allergies  DISCHARGE MEDICATIONS:   Current Discharge Medication List    START taking these medications   Details  azithromycin (ZITHROMAX) 250 MG tablet Take 1 tablet (250 mg total) by mouth daily. Qty: 3 each, Refills: 0    cefUROXime (CEFTIN) 250 MG tablet Take 1 tablet (250 mg total) by mouth 2 (two) times daily with a meal. Qty: 6 tablet, Refills: 0    predniSONE (STERAPRED UNI-PAK 21 TAB) 10 MG (21) TBPK tablet Take 6 tabs first day, 5 tab on day 2, then 4 on day 3rd, 3 tabs on day 4th , 2 tab on day 5th, and 1 tab on 6th day. Qty: 21 tablet, Refills: 0      CONTINUE these medications which have CHANGED   Details  pravastatin (PRAVACHOL) 40 MG tablet Take 1 tablet (40 mg total) by mouth daily. Qty: 30 tablet, Refills: 0      CONTINUE these medications which have NOT CHANGED   Details  albuterol (PROVENTIL HFA;VENTOLIN HFA) 108 (90 BASE) MCG/ACT inhaler Inhale 2 puffs into the lungs every 4 (four) hours.    amphetamine-dextroamphetamine (ADDERALL) 30 MG tablet Take 30 mg by mouth 2 (two) times daily.    ARIPiprazole (ABILIFY) 15 MG tablet Take 15 mg by mouth daily.    Fluticasone-Salmeterol (ADVAIR) 250-50 MCG/DOSE AEPB Inhale 1 puff into the lungs 2 (two) times daily.    gabapentin (NEURONTIN) 400 MG capsule Take  800 mg by mouth 3 (three) times daily.    pantoprazole (PROTONIX) 40 MG tablet Take 40 mg by mouth 2 (two) times daily.    tiotropium (SPIRIVA) 18 MCG inhalation capsule Place 18 mcg into inhaler and inhale daily.      STOP taking these medications     alprazolam (XANAX) 2 MG tablet      oxyCODONE (OXY IR/ROXICODONE) 5  MG immediate release tablet      Oxycodone HCl 20 MG TABS          DISCHARGE INSTRUCTIONS:    Follow with PMD in office in 1 week.  If you experience worsening of your admission symptoms, develop shortness of breath, life threatening emergency, suicidal or homicidal thoughts you must seek medical attention immediately by calling 911 or calling your MD immediately  if symptoms less severe.  You Must read complete instructions/literature along with all the possible adverse reactions/side effects for all the Medicines you take and that have been prescribed to you. Take any new Medicines after you have completely understood and accept all the possible adverse reactions/side effects.   Please note  You were cared for by a hospitalist during your hospital stay. If you have any questions about your discharge medications or the care you received while you were in the hospital after you are discharged, you can call the unit and asked to speak with the hospitalist on call if the hospitalist that took care of you is not available. Once you are discharged, your primary care physician will handle any further medical issues. Please note that NO REFILLS for any discharge medications will be authorized once you are discharged, as it is imperative that you return to your primary care physician (or establish a relationship with a primary care physician if you do not have one) for your aftercare needs so that they can reassess your need for medications and monitor your lab values.    Today   CHIEF COMPLAINT:   Chief Complaint  Patient presents with  . Altered Mental Status  . Cough  . Shortness of Breath  . Weakness  . Emesis    HISTORY OF PRESENT ILLNESS:  Angela Daniel  is a 59 y.o. female with a known history of COPD, congestive heart failure, sleep apnea, active smoker presents to the emergency room with the complaints of ongoing shortness of breath with cough with expectoration of yellow sputum  for the past 1 week. Denies any chest pain, palpitations. Also noted to have mild confusion at home which later improved. Denies any fever, chills. Does have some nausea and one vomiting but no abdominal pain, dysuria or diarrhea. On arrival in the ED, patient was noted to have hypoxia with room air O2 saturations around 88%, alert awake and oriented 3 and stable vital signs. Workup revealed elevated WBC of 14.1 and chest x-ray significant for mild bibasilar airspace opacities concerning for atelectasis versus mild pneumonia. EKG sinus tachycardia with ventricular rate of 10 9 bpm, no acute ischemic changes. Patient was placed on oxygen supplementation, was started on IV antibiotics-ceftriaxone and azithromycin, IV Solu-Medrol and vigorous DuoNeb's. Hospitalist service was consulted for further management. Patient at the current time is resting in bed, not in any acute distress, sleepy but easily arousable and alert awake and oriented 3. Patient also gives a history of fall about 1 week ago with injury to left elbow and denies any head injury. X-ray of left elbow negative for acute trauma.   VITAL SIGNS:  Blood pressure  143/74, pulse 64, temperature 97.9 F (36.6 C), temperature source Oral, resp. rate 18, height  (1.6 m), weight 61.825 kg (136 lb 4.8 oz), SpO2 94 %.  I/O:   Intake/Output Summary (Last 24 hours) at 11/09/14 1208 Last data filed at 11/09/14 0938  Gross per 24 hour  Intake    413 ml  Output      0 ml  Net    413 ml    PHYSICAL EXAMINATION:   GENERAL: 59 y.o.-year-old patient lying in the bed with no acute distress.  EYES: Pupils equal, round, reactive to light and accommodation. No scleral icterus. Extraocular muscles intact.  HEENT: Head atraumatic, normocephalic. Oropharynx and nasopharynx clear.  NECK: Supple, no jugular venous distention. No thyroid enlargement, no tenderness.  LUNGS: Normal breath sounds bilaterally, positive for wheezing,No crepitation. No use  of accessory muscles of respiration.  CARDIOVASCULAR: S1, S2 normal. No murmurs, rubs, or gallops.  ABDOMEN: Soft, nontender, nondistended. Bowel sounds present. No organomegaly or mass.  EXTREMITIES: No pedal edema, cyanosis, or clubbing.  NEUROLOGIC: Cranial nerves II through XII are intact. Muscle strength 5/5 in all extremities. Sensation intact. Gait not checked.  PSYCHIATRIC: The patient is alert and oriented x 3.  SKIN: No obvious rash, lesion, or ulcer.   DATA REVIEW:   CBC  Recent Labs Lab 11/07/14 0540  WBC 11.9*  HGB 11.6*  HCT 34.4*  PLT 251    Chemistries   Recent Labs Lab 11/06/14 2150 11/09/14 0425  NA 138  --   K 3.6  --   CL 100*  --   CO2 32  --   GLUCOSE 96  --   BUN 16  --   CREATININE 0.69 0.68  CALCIUM 8.7*  --     Cardiac Enzymes  Recent Labs Lab 11/06/14 2150  TROPONINI <0.03    Microbiology Results  Results for orders placed or performed during the hospital encounter of 11/06/14  Culture, sputum-assessment     Status: None   Collection Time: 11/07/14  6:34 PM  Result Value Ref Range Status   Specimen Description SPUTUM  Final   Special Requests SPUTUM  Final   Sputum evaluation THIS SPECIMEN IS ACCEPTABLE FOR SPUTUM CULTURE  Final   Report Status 11/08/2014 FINAL  Final  Culture, respiratory (NON-Expectorated)     Status: None (Preliminary result)   Collection Time: 11/07/14  6:34 PM  Result Value Ref Range Status   Specimen Description SPUTUM  Final   Special Requests SPUTUM Reflexed from [id number]  Final   Gram Stain PENDING  Incomplete   Culture   Final    LIGHT GROWTH GRAM NEGATIVE RODS IDENTIFICATION AND SUSCEPTIBILITIES TO FOLLOW    Report Status PENDING  Incomplete    RADIOLOGY:  No results found.    Management plans discussed with the patient, family and they are in agreement.  CODE STATUS:     Code Status Orders        Start     Ordered   11/07/14 0232  Full code   Continuous     11/07/14 0231       TOTAL TIME TAKING CARE OF THIS PATIENT: 35 minutes.    Altamese Dilling M.D on 11/09/2014 at 12:08 PM  Between 7am to 6pm - Pager - [phone]  After 6pm go to [email or web address] - password [email or web address]  [facility]  Office  [phone]  CC: Primary care physician; Pcp Not In System   Note: This dictation was prepared with  Dragon dictation along with smaller Company secretary. Any transcriptional errors that result from this process are unintentional.

## 2014-11-09 NOTE — Progress Notes (Signed)
Discharge instructions along with home medication list and follow up gone over with patient and family. Printed rx x4 given to patient. Patient instructed to called for outpt pulmonary rehab and physical therapy. Patient verbalized that she understood instructions iv removed x2, telemetry removed. Patient to be discharged home on ra. No distress noted. Family at bedside to transport patient home, volunteer to escort patient off floor [employer] [facility]

## 2014-11-09 NOTE — Care Management (Addendum)
Patient for discharge and informed that will not require home 02.  Attending states there is no need for home health nursing.  patient denies problems accessing medial care and obtaining her medications.  she has medicare/medicaid.  Acknowledges that she has all of discharge inhalers on hand.  Physical therapy recommended outpatient therapy.  Discussed the possibility of outpatient pulmonary rehab referral with attending.  Provided patient with brochure and provided patient with contact information for those appointments.

## 2014-11-11 LAB — CULTURE, RESPIRATORY

## 2014-11-11 LAB — CULTURE, RESPIRATORY W GRAM STAIN

## 2015-09-05 LAB — HM MAMMOGRAPHY

## 2015-09-15 ENCOUNTER — Encounter: Payer: Self-pay | Admitting: Emergency Medicine

## 2015-09-15 ENCOUNTER — Emergency Department
Admission: EM | Admit: 2015-09-15 | Discharge: 2015-09-15 | Disposition: A | Discharge status: Home / Self Care | Payer: Medicare Other | Attending: Student | Admitting: Student

## 2015-09-15 DIAGNOSIS — Y939 Activity, unspecified: Secondary | ICD-10-CM | POA: Diagnosis not present

## 2015-09-15 DIAGNOSIS — Y929 Unspecified place or not applicable: Secondary | ICD-10-CM | POA: Insufficient documentation

## 2015-09-15 DIAGNOSIS — W228XXA Striking against or struck by other objects, initial encounter: Secondary | ICD-10-CM | POA: Diagnosis not present

## 2015-09-15 DIAGNOSIS — Y999 Unspecified external cause status: Secondary | ICD-10-CM | POA: Diagnosis not present

## 2015-09-15 DIAGNOSIS — J441 Chronic obstructive pulmonary disease with (acute) exacerbation: Secondary | ICD-10-CM | POA: Diagnosis not present

## 2015-09-15 DIAGNOSIS — F17210 Nicotine dependence, cigarettes, uncomplicated: Secondary | ICD-10-CM | POA: Insufficient documentation

## 2015-09-15 DIAGNOSIS — Z79899 Other long term (current) drug therapy: Secondary | ICD-10-CM | POA: Diagnosis not present

## 2015-09-15 DIAGNOSIS — S0501XA Injury of conjunctiva and corneal abrasion without foreign body, right eye, initial encounter: Secondary | ICD-10-CM

## 2015-09-15 DIAGNOSIS — I509 Heart failure, unspecified: Secondary | ICD-10-CM | POA: Insufficient documentation

## 2015-09-15 DIAGNOSIS — H5711 Ocular pain, right eye: Secondary | ICD-10-CM | POA: Diagnosis present

## 2015-09-15 MED ORDER — CIPROFLOXACIN HCL 0.3 % OP SOLN: 1.0000 [drp] | OPHTHALMIC | 0 refills | Status: AC

## 2015-09-15 MED ORDER — KETOROLAC TROMETHAMINE 0.5 % OP SOLN: 1.0000 [drp] | Freq: Four times a day (QID) | OPHTHALMIC | 0 refills | Status: DC

## 2015-09-15 MED ORDER — FLUORESCEIN SODIUM 1 MG OP STRP
1.0000 | ORAL_STRIP | Freq: Once | OPHTHALMIC | Status: AC
  Administered 2015-09-15: 1 via OPHTHALMIC

## 2015-09-15 MED ORDER — EYE WASH OPHTH SOLN
OPHTHALMIC | Status: AC
  Filled 2015-09-15: qty 118

## 2015-09-15 MED ORDER — TETRACAINE HCL 0.5 % OP SOLN
2.0000 [drp] | Freq: Once | OPHTHALMIC | Status: AC
  Administered 2015-09-15: 2 [drp] via OPHTHALMIC
  Filled 2015-09-15: qty 2

## 2015-09-15 MED ORDER — FLUORESCEIN SODIUM 1 MG OP STRP
ORAL_STRIP | OPHTHALMIC | Status: AC
  Administered 2015-09-15: 1 via OPHTHALMIC
  Filled 2015-09-15: qty 1

## 2015-09-15 NOTE — ED Provider Notes (Signed)
[facility] Emergency Department Provider Note ____________________________________________  Time seen: Approximately 7:23 PM  I have reviewed the triage vital signs and the nursing notes.   HISTORY  Chief Complaint Eye Problem   HPI Angela Daniel is a 60 y.o. female resents to the emergency department for evaluation of right eye pain. She states that she believes that she has a contact lens stuck in the right eye and has been unable to get it out this afternoon. To use eye drops and saline solution to irrigate the eye without relief. She denies change in vision, but states that she feels that it is stuck in the top of her eye  Past Medical History:  Diagnosis Date  . Congestive heart failure (HCC)   . COPD (chronic obstructive pulmonary disease) (HCC)   . Degenerative joint disease   . Fibromyalgia   . Sleep apnea     Patient Active Problem List   Diagnosis Date Noted  . Acute respiratory failure with hypoxia (HCC) 11/07/2014  . COPD exacerbation (HCC) 11/07/2014  . CAP (community acquired pneumonia) 11/07/2014    Past Surgical History:  Procedure Laterality Date  . ABDOMINAL HYSTERECTOMY    . APPENDECTOMY    . CESAREAN SECTION    . CHOLECYSTECTOMY    . HIP SURGERY Left     Prior to Admission medications   Medication Sig Start Date End Date Taking? Authorizing Provider  albuterol (PROVENTIL HFA;VENTOLIN HFA) 108 (90 BASE) MCG/ACT inhaler Inhale 2 puffs into the lungs every 4 (four) hours.    Historical Provider, MD  amphetamine-dextroamphetamine (ADDERALL) 30 MG tablet Take 30 mg by mouth 2 (two) times daily.    Historical Provider, MD  ARIPiprazole (ABILIFY) 15 MG tablet Take 15 mg by mouth daily.    Historical Provider, MD  ciprofloxacin (CILOXAN) 0.3 % ophthalmic solution Place 1 drop into the right eye every 4 (four) hours while awake. 09/15/15 09/20/15  Chinita Pesterari B Karena Kinker, FNP  Fluticasone-Salmeterol (ADVAIR) 250-50 MCG/DOSE AEPB Inhale 1 puff  into the lungs 2 (two) times daily.    Historical Provider, MD  gabapentin (NEURONTIN) 400 MG capsule Take 800 mg by mouth 3 (three) times daily.    Historical Provider, MD  ketorolac (ACULAR) 0.5 % ophthalmic solution Place 1 drop into the right eye 4 (four) times daily. 09/15/15   Chinita Pesterari B Emigdio Wildeman, FNP  pantoprazole (PROTONIX) 40 MG tablet Take 40 mg by mouth 2 (two) times daily.    Historical Provider, MD  pravastatin (PRAVACHOL) 40 MG tablet Take 1 tablet (40 mg total) by mouth daily. 11/09/14   Altamese DillingVaibhavkumar Vachhani, MD  predniSONE (STERAPRED UNI-PAK 21 TAB) 10 MG (21) TBPK tablet Take 6 tabs first day, 5 tab on day 2, then 4 on day 3rd, 3 tabs on day 4th , 2 tab on day 5th, and 1 tab on 6th day. 11/09/14   Altamese DillingVaibhavkumar Vachhani, MD  tiotropium (SPIRIVA) 18 MCG inhalation capsule Place 18 mcg into inhaler and inhale daily.    Historical Provider, MD    Allergies Review of patient's allergies indicates no known allergies.  Family History  Problem Relation Age of Onset  . CVA Mother   . Lung cancer Mother   . Diabetes Mother   . Heart attack Father   . Hypertension Father   . Lung cancer Father   . Asthma Father     Social History Social History  Substance Use Topics  . Smoking status: Current Every Day Smoker    Packs/day: 0.50  Types: Cigarettes  . Smokeless tobacco: Never Used  . Alcohol use No    Review of Systems   Constitutional: No fever/chills Eyes: Negative for visual changes. Positive for pain. Musculoskeletal: Negative for pain. Skin: Negative for rash. Neurological: Negative for headaches, focal weakness or numbness. Allergic: Negative for seasonal allergies. ____________________________________________  PHYSICAL EXAM:  VITAL SIGNS: ED Triage Vitals  Enc Vitals Group     BP 09/15/15 1823 (!) 143/65     Pulse Rate 09/15/15 1823 65     Resp 09/15/15 1823 16     Temp 09/15/15 1823 98.6 F (37 C)     Temp Source 09/15/15 1823 Oral     SpO2 09/15/15  1823 97 %     Weight 09/15/15 1822 142 lb (64.4 kg)     Height 09/15/15 1822 5\' 3"  (1.6 m)     Head Circumference --      Peak Flow --      Pain Score 09/15/15 1822 8     Pain Loc --      Pain Edu? --      Excl. in GC? --     Constitutional: Alert and oriented. Well appearing and in no acute distress. Eyes: Visual acuity--see nursing documentation; No globe trauma; Eyelids normal to inspection; Sclera appears and anicteric.  Eyelids were inverted. Conjunctiva appears erythematous; Cornea abrasion noted at the 12:00 position in a concave shape measuring approximately 3 mm. Head: Atraumatic. Nose: No congestion/rhinnorhea. Mouth/Throat: Mucous membranes are moist.  Oropharynx non-erythematous. Respiratory: Even and unlabored Musculoskeletal:Normal ROM x 4 extremities. Neurologic:  Normal speech and language. No gross focal neurologic deficits are appreciated. Speech is normal. No gait instability. Skin:  Skin is warm, dry and intact. No rash noted. Psychiatric: Mood and affect are normal. Speech and behavior are normal.  ____________________________________________   LABS (all labs ordered are listed, but only abnormal results are displayed)  Labs Reviewed - No data to display ____________________________________________  EKG   ____________________________________________  RADIOLOGY   ____________________________________________   PROCEDURES  Procedure(s) performed: Fluorescein stain exam ____________________________________________   INITIAL IMPRESSION / ASSESSMENT AND PLAN / ED COURSE  Pertinent labs & imaging results that were available during my care of the patient were reviewed by me and considered in my medical decision making (see chart for details).  Clinical Course    Patient to receive prescriptions for Ketorolac and Cipro drops.  She was advised to follow up with her ophthalmologist or Dr. Brooke Dare who is on ER call for symptoms that are not improving over  the next 2-3 days. She was  also advised to return to the ER for symptoms that change or worsen if unable to schedule an appointment.  ____________________________________________   FINAL CLINICAL IMPRESSION(S) / ED DIAGNOSES  Final diagnoses:  Corneal abrasion, right, initial encounter    Note:  This document was prepared using Dragon voice recognition software and may include unintentional dictation errors.    Chinita Pester, FNP 09/15/15 2013    Gayla Doss, MD 09/16/15 [phone]

## 2015-09-15 NOTE — ED Notes (Signed)
See triage note  States she got her contact stuck in right eye. Unsure how she did this   Right is irritated

## 2015-09-15 NOTE — ED Triage Notes (Signed)
States a contact is stuck in right eye.  Onset of symptoms this evening.

## 2015-09-15 NOTE — Discharge Instructions (Signed)
Follow up with your ophthalmologist on Monday. Return to the ER for symptoms that are not improving if you are unable to schedule an appointment.

## 2017-01-04 ENCOUNTER — Emergency Department: Payer: Medicare Other

## 2017-01-04 ENCOUNTER — Encounter: Payer: Self-pay | Admitting: Emergency Medicine

## 2017-01-04 ENCOUNTER — Emergency Department
Admission: EM | Admit: 2017-01-04 | Discharge: 2017-01-04 | Disposition: A | Discharge status: Home / Self Care | Payer: Medicare Other | Attending: Emergency Medicine | Admitting: Emergency Medicine

## 2017-01-04 DIAGNOSIS — J449 Chronic obstructive pulmonary disease, unspecified: Secondary | ICD-10-CM | POA: Insufficient documentation

## 2017-01-04 DIAGNOSIS — F17210 Nicotine dependence, cigarettes, uncomplicated: Secondary | ICD-10-CM | POA: Insufficient documentation

## 2017-01-04 DIAGNOSIS — I509 Heart failure, unspecified: Secondary | ICD-10-CM | POA: Insufficient documentation

## 2017-01-04 DIAGNOSIS — M25562 Pain in left knee: Secondary | ICD-10-CM | POA: Diagnosis present

## 2017-01-04 DIAGNOSIS — Z79899 Other long term (current) drug therapy: Secondary | ICD-10-CM | POA: Insufficient documentation

## 2017-01-04 MED ORDER — KETOROLAC TROMETHAMINE 60 MG/2ML IM SOLN
60.0000 mg | Freq: Once | INTRAMUSCULAR | Status: DC
  Filled 2017-01-04: qty 2

## 2017-01-04 MED ORDER — MELOXICAM 15 MG PO TABS: 15.0000 mg | ORAL_TABLET | Freq: Every day | ORAL | 0 refills | Status: DC

## 2017-01-04 NOTE — ED Notes (Signed)
Pt upset that she could not get any stronger pain medication than Toradol, which she refused for this RN. Pt stated "I didn't get to see a doctor. I only saw a PA. That Toradol ain't worth a damn. I don't want it. This hospital has gone to shit. I'm going to report yall." She then told this RN, "but I know it's your not your fault, honey." Pt ambulatory, but limping, to wheel chair upon discharge. Verbalized understanding of discharge instructions, prescription and follow up care. VSS. Skin warm and dry. A&O x4.

## 2017-01-04 NOTE — ED Notes (Signed)
Pt reports falling up her steps onto her left knee x 1 week ago but "has been putting it off" to come and get it checked out. Pain has worsened the past several days. Pt having difficulty putting weight on knee and has minimal movement without manipulating it with her hand. Pt very tender medial, lateral and superior to knee with the most tender area from bottom of left thigh to just above knee cap.

## 2017-01-04 NOTE — ED Notes (Signed)
Portable XR at bedside

## 2017-01-04 NOTE — ED Provider Notes (Signed)
[facility] Emergency Department Provider Note   ____________________________________________   First MD Initiated Contact with Patient 01/04/17 1238     (approximate)  I have reviewed the triage vital signs and the nursing notes.   HISTORY  Chief Complaint Knee Pain    HPI Angela Daniel is a 61 y.o. female patient complaining of 2 days of left knee pain. Patient states she felt pressure 1 week ago but doesn't feel like pains related to the fall. Patient denies any other provocative incident.Patient describes the pain as "achy". Patient rates as 10 over 10. No palliative measures for complaint.   Past Medical History:  Diagnosis Date  . Congestive heart failure (HCC)   . COPD (chronic obstructive pulmonary disease) (HCC)   . Degenerative joint disease   . Fibromyalgia   . Sleep apnea     Patient Active Problem List   Diagnosis Date Noted  . Acute respiratory failure with hypoxia (HCC) 11/07/2014  . COPD exacerbation (HCC) 11/07/2014  . CAP (community acquired pneumonia) 11/07/2014    Past Surgical History:  Procedure Laterality Date  . ABDOMINAL HYSTERECTOMY    . APPENDECTOMY    . CESAREAN SECTION    . CHOLECYSTECTOMY    . HIP SURGERY Left     Prior to Admission medications   Medication Sig Start Date End Date Taking? Authorizing Provider  albuterol (PROVENTIL HFA;VENTOLIN HFA) 108 (90 BASE) MCG/ACT inhaler Inhale 2 puffs into the lungs every 4 (four) hours.    [provider]  amphetamine-dextroamphetamine (ADDERALL) 30 MG tablet Take 30 mg by mouth 2 (two) times daily.    [provider]  ARIPiprazole (ABILIFY) 15 MG tablet Take 15 mg by mouth daily.    [provider]  Fluticasone-Salmeterol (ADVAIR) 250-50 MCG/DOSE AEPB Inhale 1 puff into the lungs 2 (two) times daily.    [provider]  gabapentin (NEURONTIN) 400 MG capsule Take 800 mg by mouth 3 (three) times daily.    [provider]   ketorolac (ACULAR) 0.5 % ophthalmic solution Place 1 drop into the right eye 4 (four) times daily. 09/15/15   Triplett, Rulon Eisenmenger B, FNP  meloxicam (MOBIC) 15 MG tablet Take 1 tablet (15 mg total) by mouth daily. 01/04/17   Joni Reining, PA-C  pantoprazole (PROTONIX) 40 MG tablet Take 40 mg by mouth 2 (two) times daily.    [provider]  pravastatin (PRAVACHOL) 40 MG tablet Take 1 tablet (40 mg total) by mouth daily. 11/09/14   Altamese Dilling, MD  predniSONE (STERAPRED UNI-PAK 21 TAB) 10 MG (21) TBPK tablet Take 6 tabs first day, 5 tab on day 2, then 4 on day 3rd, 3 tabs on day 4th , 2 tab on day 5th, and 1 tab on 6th day. 11/09/14   Altamese Dilling, MD  tiotropium (SPIRIVA) 18 MCG inhalation capsule Place 18 mcg into inhaler and inhale daily.    [provider]    Allergies Tramadol; Tylenol [acetaminophen]; and Vicodin [hydrocodone-acetaminophen]  Family History  Problem Relation Age of Onset  . CVA Mother   . Lung cancer Mother   . Diabetes Mother   . Heart attack Father   . Hypertension Father   . Lung cancer Father   . Asthma Father     Social History Social History   Tobacco Use  . Smoking status: Current Every Day Smoker    Packs/day: 0.50    Types: Cigarettes  . Smokeless tobacco: Never Used  Substance Use Topics  .  Alcohol use: No  . Drug use: No    Review of Systems Constitutional: No fever/chills Eyes: No visual changes. ENT: No sore throat. Cardiovascular: Denies chest pain. Respiratory: Denies shortness of breath. Gastrointestinal: No abdominal pain.  No nausea, no vomiting.  No diarrhea.  No constipation. Genitourinary: Negative for dysuria. Musculoskeletal: Left knee pain Skin: Negative for rash. Neurological: Negative for headaches, focal weakness or numbness.   ____________________________________________   PHYSICAL EXAM:  VITAL SIGNS: ED Triage Vitals  Enc Vitals Group     BP 01/04/17 1225 140/71     Pulse  Rate 01/04/17 1225 97     Resp 01/04/17 1225 18     Temp 01/04/17 1225 98.2 F (36.8 C)     Temp Source 01/04/17 1225 Oral     SpO2 01/04/17 1225 95 %     Weight 01/04/17 1227 143 lb (64.9 kg)     Height 01/04/17 1227 5\' 3"  (1.6 m)     Head Circumference --      Peak Flow --      Pain Score 01/04/17 1222 10     Pain Loc --      Pain Edu? --      Excl. in GC? --    Constitutional: Alert and oriented. Well appearing and in no acute distress. Hematological/Lymphatic/Immunilogical: No cervical lymphadenopathy. Cardiovascular: Normal rate, regular rhythm. Grossly normal heart sounds.  Good peripheral circulation. Respiratory: Normal respiratory effort.  No retractions. Lungs CTAB. Gastrointestinal: Soft and nontender. No distention. No abdominal bruits. No CVA tenderness. Musculoskeletal: No obvious deformity or edema. No joint effusions. Neurologic:  Normal speech and language. No gross focal neurologic deficits are appreciated. No gait instability. Skin:  Skin is warm, dry and intact. No rash noted. Psychiatric: Mood and affect are normal. Speech and behavior are normal.  ____________________________________________   LABS (all labs ordered are listed, but only abnormal results are displayed)  Labs Reviewed - No data to display ____________________________________________  EKG   ____________________________________________  RADIOLOGY  Dg Knee Complete 4 Views Left  Result Date: 01/04/2017 CLINICAL DATA:  Injury 1 week ago with left knee pain. EXAM: LEFT KNEE - COMPLETE 4+ VIEW COMPARISON:  None. FINDINGS: No evidence of fracture, dislocation, or joint effusion. No evidence of arthropathy or other focal bone abnormality. Soft tissues are unremarkable. IMPRESSION: Negative. Electronically Signed   By: Irish LackGlenn  Yamagata M.D.   On: 01/04/2017 13:43    ____________________________________________   PROCEDURES  Procedure(s) performed: None  Procedures  Critical Care  performed: No  ____________________________________________   INITIAL IMPRESSION / ASSESSMENT AND PLAN / ED COURSE  As part of my medical decision making, I reviewed the following data within the electronic MEDICAL RECORD NUMBER Notes from prior ED visits and [place] Controlled Substance Database   Patient complaining of acute left knee pain without objective findings. Patient is currently taking Suboxone which she says prescribed by her psychiatrist. Discussed negative straight finding with patient. Patient given discharge care instruction. Patient offered Toradol prior to departure and a prescription for meloxicam. Patient refused Toradol. Patient advised to continue previous medication and follow-up with her treating doctor. Patient is not satisfied because she was not given a stronger pain medication.      ____________________________________________   FINAL CLINICAL IMPRESSION(S) / ED DIAGNOSES  Final diagnoses:  Acute pain of left knee     ED Discharge Orders        Ordered    meloxicam (MOBIC) 15 MG tablet  Daily  01/04/17 1357       Note:  This document was prepared using Dragon voice recognition software and may include unintentional dictation errors.    Joni ReiningSmith, Lillard Bailon K, PA-C 01/04/17 1401    Joni ReiningSmith, Fardeen Steinberger K, PA-C 01/04/17 1406    Emily FilbertWilliams, Jonathan E, MD 01/04/17 [phone]

## 2017-01-04 NOTE — ED Triage Notes (Signed)
Patient presents to the ED with left knee pain x 2 days.  Patient states she fell approx. 1 week ago but does not feel like knee pain is related to fall.  Denies known injury to knee.  Patient denies history of knee pain.

## 2017-01-30 ENCOUNTER — Emergency Department: Payer: Medicare Other

## 2017-01-30 ENCOUNTER — Emergency Department
Admission: EM | Admit: 2017-01-30 | Discharge: 2017-01-30 | Disposition: A | Discharge status: Home / Self Care | Payer: Medicare Other | Attending: Student in an Organized Health Care Education/Training Program | Admitting: Student in an Organized Health Care Education/Training Program

## 2017-01-30 ENCOUNTER — Encounter: Payer: Self-pay | Admitting: Emergency Medicine

## 2017-01-30 DIAGNOSIS — R109 Unspecified abdominal pain: Secondary | ICD-10-CM | POA: Insufficient documentation

## 2017-01-30 DIAGNOSIS — F17210 Nicotine dependence, cigarettes, uncomplicated: Secondary | ICD-10-CM | POA: Diagnosis not present

## 2017-01-30 DIAGNOSIS — G8929 Other chronic pain: Secondary | ICD-10-CM | POA: Diagnosis not present

## 2017-01-30 DIAGNOSIS — M546 Pain in thoracic spine: Secondary | ICD-10-CM | POA: Diagnosis not present

## 2017-01-30 DIAGNOSIS — I110 Hypertensive heart disease with heart failure: Secondary | ICD-10-CM | POA: Insufficient documentation

## 2017-01-30 DIAGNOSIS — Z79899 Other long term (current) drug therapy: Secondary | ICD-10-CM | POA: Insufficient documentation

## 2017-01-30 DIAGNOSIS — I509 Heart failure, unspecified: Secondary | ICD-10-CM | POA: Insufficient documentation

## 2017-01-30 DIAGNOSIS — R079 Chest pain, unspecified: Secondary | ICD-10-CM | POA: Diagnosis present

## 2017-01-30 DIAGNOSIS — J449 Chronic obstructive pulmonary disease, unspecified: Secondary | ICD-10-CM | POA: Diagnosis not present

## 2017-01-30 DIAGNOSIS — G894 Chronic pain syndrome: Secondary | ICD-10-CM

## 2017-01-30 HISTORY — DX: Essential (primary) hypertension: I10

## 2017-01-30 LAB — CBC
HCT: 38.1 % (ref 35.0–47.0)
HEMOGLOBIN: 12.7 g/dL (ref 12.0–16.0)
MCH: 30.7 pg (ref 26.0–34.0)
MCHC: 33.3 g/dL (ref 32.0–36.0)
MCV: 92 fL (ref 80.0–100.0)
PLATELETS: 242 10*3/uL (ref 150–440)
RBC: 4.15 MIL/uL (ref 3.80–5.20)
RDW: 14 % (ref 11.5–14.5)
WBC: 6.1 10*3/uL (ref 3.6–11.0)

## 2017-01-30 LAB — BASIC METABOLIC PANEL
ANION GAP: 11 (ref 5–15)
BUN: 12 mg/dL (ref 6–20)
CALCIUM: 8.7 mg/dL — AB (ref 8.9–10.3)
CHLORIDE: 106 mmol/L (ref 101–111)
CO2: 23 mmol/L (ref 22–32)
CREATININE: 0.77 mg/dL (ref 0.44–1.00)
GFR calc non Af Amer: >60 mL/min (ref >60)
Glucose, Bld: 152 mg/dL — ABNORMAL HIGH (ref 65–99)
Potassium: 4 mmol/L (ref 3.5–5.1)
SODIUM: 140 mmol/L (ref 135–145)

## 2017-01-30 LAB — TROPONIN I

## 2017-01-30 MED ORDER — CYCLOBENZAPRINE HCL 5 MG PO TABS: 5.0000 mg | ORAL_TABLET | Freq: Three times a day (TID) | ORAL | 0 refills | Status: DC | PRN

## 2017-01-30 MED ORDER — MORPHINE SULFATE (PF) 4 MG/ML IV SOLN
4.0000 mg | Freq: Once | INTRAVENOUS | Status: AC
  Administered 2017-01-30: 4 mg via INTRAVENOUS
  Filled 2017-01-30: qty 1

## 2017-01-30 MED ORDER — HALOPERIDOL LACTATE 5 MG/ML IJ SOLN
5.0000 mg | Freq: Once | INTRAMUSCULAR | Status: AC
  Administered 2017-01-30: 5 mg via INTRAMUSCULAR
  Filled 2017-01-30: qty 1

## 2017-01-30 MED ORDER — LIDOCAINE 5 % EX PTCH: 1.0000 | MEDICATED_PATCH | Freq: Two times a day (BID) | CUTANEOUS | 0 refills | Status: AC

## 2017-01-30 MED ORDER — MELOXICAM 15 MG PO TABS: 15.0000 mg | ORAL_TABLET | Freq: Every day | ORAL | 0 refills | Status: DC

## 2017-01-30 MED ORDER — KETOROLAC TROMETHAMINE 30 MG/ML IJ SOLN
15.0000 mg | Freq: Once | INTRAMUSCULAR | Status: AC
  Administered 2017-01-30: 15 mg via INTRAVENOUS
  Filled 2017-01-30: qty 1

## 2017-01-30 NOTE — ED Provider Notes (Signed)
[facility] Emergency Department Provider Note  ____________________________________________   First MD Initiated Contact with Patient 01/30/17 [id number]     (approximate)  I have reviewed the triage vital signs and the nursing notes.   HISTORY  Chief Complaint Chest Pain    HPI Angela Daniel is a 62 y.o. female with extensive history of chronic pain syndromes and who previously went to a pain clinic before she decided to stop going.  She presents by private vehicle tonight for evaluation of severe pain in the middle of her back radiating around to the left side.  Initially after she checked in she then said that it is rating in route to her chest so she was processed as a chest pain protocol, but she is very clear with me that the pain is severe in her back and radiating around her back and towards the front but that it seems to be primarily a back issue.  She reports that the onset was 2-3 days ago and that it is constant and severe although it does come in waves that are worse.  She describes it as a sharp pain.  Moving around makes it worse and nothing in particular makes it better.  She had some Percocet left over that she took that did help a little bit but the pain returned.  She denies fever/chills, chest pain, shortness of breath, nausea, vomiting, dysuria, urinary hesitancy, urinary incontinence, and bowel changes.  Her daughter commented that she did more work around the house than usual just before the pain started so they wonder if maybe she over exerted and strained something.  We talked a bit more about her chronic pain history and she formally was on oxycodone and OxyContin, then her psychiatrist wrote her for Suboxone after she stopped going to the pain clinic (she states she just decided to stop going).  She states that she would like to be in a pain clinic again but has not been able to do so.  Past Medical History:  Diagnosis Date  . Congestive heart  failure (HCC)   . COPD (chronic obstructive pulmonary disease) (HCC)   . Degenerative joint disease   . Fibromyalgia   . Hypertension   . Sleep apnea     Patient Active Problem List   Diagnosis Date Noted  . Acute respiratory failure with hypoxia (HCC) 11/07/2014  . COPD exacerbation (HCC) 11/07/2014  . CAP (community acquired pneumonia) 11/07/2014    Past Surgical History:  Procedure Laterality Date  . ABDOMINAL HYSTERECTOMY    . APPENDECTOMY    . CESAREAN SECTION    . CHOLECYSTECTOMY    . HIP SURGERY Left     Prior to Admission medications   Medication Sig Start Date End Date Taking? Authorizing Provider  albuterol (PROVENTIL HFA;VENTOLIN HFA) 108 (90 BASE) MCG/ACT inhaler Inhale 2 puffs into the lungs every 4 (four) hours.    [provider]  amphetamine-dextroamphetamine (ADDERALL) 30 MG tablet Take 30 mg by mouth 2 (two) times daily.    [provider]  ARIPiprazole (ABILIFY) 15 MG tablet Take 15 mg by mouth daily.    [provider]  cyclobenzaprine (FLEXERIL) 5 MG tablet Take 1 tablet (5 mg total) by mouth 3 (three) times daily as needed for muscle spasms. 01/30/17   Loleta Rose, MD  Fluticasone-Salmeterol (ADVAIR) 250-50 MCG/DOSE AEPB Inhale 1 puff into the lungs 2 (two) times daily.    [provider]  gabapentin (NEURONTIN) 400 MG capsule Take  800 mg by mouth 3 (three) times daily.    [provider]  ketorolac (ACULAR) 0.5 % ophthalmic solution Place 1 drop into the right eye 4 (four) times daily. 09/15/15   Triplett, Rulon Eisenmengerari B, FNP  meloxicam (MOBIC) 15 MG tablet Take 1 tablet (15 mg total) by mouth daily. 01/30/17   Loleta RoseForbach, Richard Ritchey, MD  pantoprazole (PROTONIX) 40 MG tablet Take 40 mg by mouth 2 (two) times daily.    [provider]  pravastatin (PRAVACHOL) 40 MG tablet Take 1 tablet (40 mg total) by mouth daily. 11/09/14   Altamese DillingVachhani, Vaibhavkumar, MD  predniSONE (STERAPRED UNI-PAK 21 TAB) 10 MG (21) TBPK tablet Take 6  tabs first day, 5 tab on day 2, then 4 on day 3rd, 3 tabs on day 4th , 2 tab on day 5th, and 1 tab on 6th day. 11/09/14   Altamese DillingVachhani, Vaibhavkumar, MD  tiotropium (SPIRIVA) 18 MCG inhalation capsule Place 18 mcg into inhaler and inhale daily.    [provider]    Allergies Tramadol; Tylenol [acetaminophen]; and Vicodin [hydrocodone-acetaminophen]  Family History  Problem Relation Age of Onset  . CVA Mother   . Lung cancer Mother   . Diabetes Mother   . Heart attack Father   . Hypertension Father   . Lung cancer Father   . Asthma Father     Social History Social History   Tobacco Use  . Smoking status: Current Every Day Smoker    Packs/day: 0.50    Types: Cigarettes  . Smokeless tobacco: Never Used  Substance Use Topics  . Alcohol use: No  . Drug use: No    Review of Systems Constitutional: No fever/chills Eyes: No visual changes. ENT: No sore throat. Cardiovascular: Denies chest pain. Respiratory: Denies shortness of breath. Gastrointestinal: No abdominal pain.  No nausea, no vomiting.  No diarrhea.  No constipation. Genitourinary: Negative for dysuria. Musculoskeletal: Severe mid back pain radiating around to the left side.  Some lower back pain as well.   Integumentary: Negative for rash. Neurological: Negative for headaches, focal weakness or numbness. Psychiatric:No complaints  ____________________________________________   PHYSICAL EXAM:  VITAL SIGNS: ED Triage Vitals  Enc Vitals Group     BP 01/30/17 0239 (!) 169/85     Pulse Rate 01/30/17 0239 74     Resp 01/30/17 0239 (!) 22     Temp 01/30/17 0239 98.2 F (36.8 C)     Temp Source 01/30/17 0239 Oral     SpO2 01/30/17 0239 96 %     Weight 01/30/17 0237 64.9 kg (143 lb)     Height --      Head Circumference --      Peak Flow --      Pain Score --      Pain Loc --      Pain Edu? --      Excl. in GC? --     Constitutional: Alert and oriented.  Appears somewhat chronically ill and older  than stated age.  Appears very uncomfortable, writhing around and moaning. Eyes: Conjunctivae are normal.  Head: Atraumatic. Nose: No congestion/rhinnorhea. Mouth/Throat: Mucous membranes are moist. Neck: No stridor.  No meningeal signs.   Cardiovascular: Normal rate, regular rhythm. Good peripheral circulation. Grossly normal heart sounds. Respiratory: Normal respiratory effort.  No retractions. Lungs CTAB. Gastrointestinal: Soft and nontender. No distention.  Musculoskeletal: No gross deformities or abnormalities appreciated on physical exam of her back.  Initially she jumped and moaned when I but I commented that  I barely touch her and was surprised that it hurt, and she explained that the pain is on the inside not on the outside.  No evidence of any abscess, cellulitis, or vesicular rash. Neurologic:  Normal speech and language. No gross focal neurologic deficits are appreciated.  Skin:  Skin is warm, dry and intact. No rash noted.   ____________________________________________   LABS (all labs ordered are listed, but only abnormal results are displayed)  Labs Reviewed  BASIC METABOLIC PANEL - Abnormal; Notable for the following components:      Result Value   Glucose, Bld 152 (*)    Calcium 8.7 (*)    All other components within normal limits  CBC  TROPONIN I   ____________________________________________  EKG  ED ECG REPORT I, Loleta Rose, the attending physician, personally viewed and interpreted this ECG.  Date: 01/30/2017 EKG Time: 2:35 AM Rate: 80 Rhythm: normal sinus rhythm QRS Axis: normal Intervals: normal ST/T Wave abnormalities: Non-specific ST segment / T-wave changes, but no evidence of acute ischemia. Narrative Interpretation: no evidence of acute ischemia   ____________________________________________  RADIOLOGY   Dg Chest 2 View  Result Date: 01/30/2017 CLINICAL DATA:  Chest pain EXAM: CHEST  2 VIEW COMPARISON:  None. FINDINGS: There is left  greater than right bibasilar atelectasis. Cardiomediastinal contours are normal. No focal airspace consolidation or pulmonary edema. IMPRESSION: Left-greater-than-right bibasilar atelectasis without focal airspace consolidation. Electronically Signed   By: Deatra Robinson M.D.   On: 01/30/2017 03:14   Ct Thoracic Spine Wo Contrast  Result Date: 01/30/2017 CLINICAL DATA:  Mid back pain EXAM: CT THORACIC AND LUMBAR SPINE WITHOUT CONTRAST TECHNIQUE: Multidetector CT imaging of the thoracic and lumbar spine was performed without contrast. Multiplanar CT image reconstructions were also generated. COMPARISON:  None. FINDINGS: CT THORACIC SPINE FINDINGS Alignment: Normal Vertebrae: Negative for fracture or mass. Paraspinal and other soft tissues: Negative for mass or adenopathy. No pleural effusion. Mild bibasilar airspace disease. Disc levels: Disc degeneration most prominent at T10-11 with anterior endplate sclerosis and mild spurring. Mild anterior spurring is present elsewhere in the midthoracic spine. Negative for disc protrusion or spinal stenosis. CT LUMBAR SPINE FINDINGS Segmentation: Normal Alignment: Normal Vertebrae: Negative for fracture or mass Paraspinal and other soft tissues: Mild atherosclerotic disease in the aorta. No retroperitoneal mass. Disc levels: L1-2: Mild disc bulging L2-3: Mild disc bulging L3-4: Moderate disc bulging. Mild facet degeneration and mild spinal stenosis. L4-5: Moderate disc bulging with calcification of posterior annular fibers. Bilateral facet hypertrophy. Moderate spinal stenosis. L5-S1: Mild disc bulging and mild facet degeneration. IMPRESSION: CT THORACIC SPINE IMPRESSION Negative for fracture.  Mild degenerative changes without stenosis CT LUMBAR SPINE IMPRESSION Negative for fracture. Multilevel degenerative change most prominent at L4-5 with moderate spinal stenosis. Electronically Signed   By: Marlan Palau M.D.   On: 01/30/2017 07:51   Ct Lumbar Spine Wo  Contrast  Result Date: 01/30/2017 CLINICAL DATA:  Mid back pain EXAM: CT THORACIC AND LUMBAR SPINE WITHOUT CONTRAST TECHNIQUE: Multidetector CT imaging of the thoracic and lumbar spine was performed without contrast. Multiplanar CT image reconstructions were also generated. COMPARISON:  None. FINDINGS: CT THORACIC SPINE FINDINGS Alignment: Normal Vertebrae: Negative for fracture or mass. Paraspinal and other soft tissues: Negative for mass or adenopathy. No pleural effusion. Mild bibasilar airspace disease. Disc levels: Disc degeneration most prominent at T10-11 with anterior endplate sclerosis and mild spurring. Mild anterior spurring is present elsewhere in the midthoracic spine. Negative for disc protrusion or spinal stenosis. CT LUMBAR SPINE  FINDINGS Segmentation: Normal Alignment: Normal Vertebrae: Negative for fracture or mass Paraspinal and other soft tissues: Mild atherosclerotic disease in the aorta. No retroperitoneal mass. Disc levels: L1-2: Mild disc bulging L2-3: Mild disc bulging L3-4: Moderate disc bulging. Mild facet degeneration and mild spinal stenosis. L4-5: Moderate disc bulging with calcification of posterior annular fibers. Bilateral facet hypertrophy. Moderate spinal stenosis. L5-S1: Mild disc bulging and mild facet degeneration. IMPRESSION: CT THORACIC SPINE IMPRESSION Negative for fracture.  Mild degenerative changes without stenosis CT LUMBAR SPINE IMPRESSION Negative for fracture. Multilevel degenerative change most prominent at L4-5 with moderate spinal stenosis. Electronically Signed   By: Marlan Palau M.D.   On: 01/30/2017 07:51   Ct Renal Stone Study  Result Date: 01/30/2017 CLINICAL DATA:  Back and flank pain radiating into the left chest for the past 3 days. EXAM: CT ABDOMEN AND PELVIS WITHOUT CONTRAST TECHNIQUE: Multidetector CT imaging of the abdomen and pelvis was performed following the standard protocol without IV contrast. COMPARISON:  CT abdomen pelvis dated March 28, 2014. FINDINGS: Lower chest: No acute abnormality.  Bibasilar atelectasis/scarring. Hepatobiliary: No focal liver abnormality. Prior cholecystectomy. Stable mild intrahepatic biliary ductal dilatation. Pancreas: Unremarkable. No pancreatic ductal dilatation or surrounding inflammatory changes. Spleen: Normal in size without focal abnormality. Adrenals/Urinary Tract: The adrenal glands are unremarkable. The kidneys are normal in appearance. No renal calculi. No definite ureteral calculi, although the distal left ureter is obscured by streak artifact. No hydronephrosis. The bladder is unremarkable. Stomach/Bowel: Stomach is within normal limits. The appendix is not visualized. Sigmoid diverticulosis. No bowel wall thickening, distention, or surrounding inflammatory changes. Vascular/Lymphatic: Aortic atherosclerosis. Unchanged duplicated IVC. No enlarged abdominal or pelvic lymph nodes. Reproductive: Status post hysterectomy. No adnexal masses. Other: No free fluid or pneumoperitoneum. Musculoskeletal: No acute or significant osseous findings. Prior left total hip arthroplasty. IMPRESSION: 1. No acute intra-abdominal process. No definite renal or ureteral calculi, although the distal left ureter is obscured by streak artifact from left hip arthroplasty. No hydronephrosis. 2.  Aortic atherosclerosis (ICD10-I70.0). Electronically Signed   By: Obie Dredge M.D.   On: 01/30/2017 07:57    ____________________________________________   PROCEDURES  Critical Care performed: No   Procedure(s) performed:   Procedures   ____________________________________________   INITIAL IMPRESSION / ASSESSMENT AND PLAN / ED COURSE  As part of my medical decision making, I reviewed the following data within the electronic MEDICAL RECORD NUMBER Nursing notes reviewed and incorporated, Labs reviewed , EKG interpreted , Old chart reviewed and Notes from prior ED visits  Differential diagnosis includes, but is not limited to,  musculoskeletal pain, acute exacerbation of chronic pain in the setting of no longer having access to narcotics, muscle spasms/muscle strain, renal colic, pathologic fractures of T-spine and/or L-spine, osteomyelitis/discitis, etc.  The patient has no evidence of acute infection and all of her lab work is reassuring.  Vital signs are normal except for hypertension which is likely secondary to her discomfort.  I explained to her that I would look for any sign of acute infection and treat her pain at this time and then will reassess what needs to be done but I will likely not prescribe narcotics and will give her contact information for the [facility] pain clinic.  I will further evaluate with CT scan of the abdomen and pelvis (renal protocol) and noncontrast studies of the T-spine and L-spine.  Clinical Course as of Jan 31 799  Thu Jan 30, 2017  [id number] I reviewed the patient's prescription history over the last  24 months in the multi-state controlled substances database(s) that includes [place], [place], [place], [place], [place], [place], [place], [place], [place], [place], [place], [place], [place], [place], and [place].  Results were notable for 135 total prescriptions for controlled substances from 5 different prescribers in 7 to her menses over the last 2 years.  She appears to currently be taking Suboxone for at least the last few months and prior to that she has regular prescriptions for oxycodone in addition to benzodiazepines and Dextroamphetamine-Amphetamine.  She is at high risk for abuse based on her controlled substance report.  [CF]  [id number] Still reporting back pain and moaning.  CT scans are not yet available.  I updated her about the plan and I have transferred care to Dr. Roxan Hockey to follow-up the CT scans but I explained to the patient and her daughter that she would be discharged without narcotic prescriptions unless there was an acute or emergent medical  condition identified on her scans.  For her persistent pain in the setting of chronic pain I will administer 5 mg of Haldol intramuscular as both analgesic and a calming agent which will also avoid benzodiazepines which are contraindicated in the geriatric age group.  [CF]    Clinical Course User Index [CF] Loleta Rose, MD    ____________________________________________  FINAL CLINICAL IMPRESSION(S) / ED DIAGNOSES  Final diagnoses:  Acute left-sided thoracic back pain  Chronic pain syndrome     MEDICATIONS GIVEN DURING THIS VISIT:  Medications  haloperidol lactate (HALDOL) injection 5 mg (not administered)  morphine 4 MG/ML injection 4 mg (4 mg Intravenous Given 01/30/17 0615)  ketorolac (TORADOL) 30 MG/ML injection 15 mg (15 mg Intravenous Given 01/30/17 0615)     ED Discharge Orders        Ordered    cyclobenzaprine (FLEXERIL) 5 MG tablet  3 times daily PRN     01/30/17 0729    meloxicam (MOBIC) 15 MG tablet  Daily     01/30/17 0729       Note:  This document was prepared using Dragon voice recognition software and may include unintentional dictation errors.    Loleta Rose, MD 01/30/17 [phone]

## 2017-01-30 NOTE — ED Notes (Signed)
Lab results reviewed. Awaiting room for MD eval.  

## 2017-01-30 NOTE — Discharge Instructions (Signed)
As we discussed, we did not identify any new or emergent cause for your back pain.  We believe you probably exacerbated your back muscles with your recent activity and are suffering not only from some muscle strain but from your chronic pain syndromes and not having any narcotics available to you at this time.  It is notable that according to your prescription record you should still have Suboxone left from your psychiatrist.  I encourage you to follow-up with both your primary care doctor and your psychiatrist to discuss pain management until you can be seen at a pain clinic, but as we discussed, I cannot prescribe narcotic medication for you at this time.  I have prescribed nonnarcotic options that may help with your back pain and encourage you to follow-up as soon as possible with your outpatient providers.  Return to the emergency department if you develop new or worsening symptoms that concern you.

## 2017-01-30 NOTE — ED Notes (Signed)
Pt called out, upon reassessment, pt resting comfortably, opens her eyes and states, "I am still really hurting."  EDP notified.

## 2017-01-30 NOTE — ED Provider Notes (Signed)
Patient received in sign-out from Dr. York CeriseForbach.  Workup and evaluation pending follow-up CT scans.  CT imaging shows no acute abnormality.  Patient remains hemodynamically stable.  Blood work is reassuring.  Still an moderate pain but patient has chronic pain is not been following up with her chronic pain physician.  At this point I do not believe the patient requires admission as there is no evidence of any acute emergent process.  Patient will be discharged for follow-up as an outpatient.Angela Daniel.      [name], MD 01/30/17 [phone]

## 2017-01-30 NOTE — ED Notes (Signed)
Patient transported to CT 

## 2017-01-30 NOTE — ED Triage Notes (Signed)
Pt c/o lumbar back pain that radiates into the left chest x3 days, worsening tonight. Pt has HX/O CHF, COPD and hypertension.

## 2017-05-21 ENCOUNTER — Other Ambulatory Visit: Payer: Self-pay | Admitting: Family

## 2017-05-21 DIAGNOSIS — Z1231 Encounter for screening mammogram for malignant neoplasm of breast: Secondary | ICD-10-CM

## 2017-05-25 DIAGNOSIS — I110 Hypertensive heart disease with heart failure: Secondary | ICD-10-CM | POA: Insufficient documentation

## 2017-05-25 DIAGNOSIS — N3001 Acute cystitis with hematuria: Secondary | ICD-10-CM | POA: Insufficient documentation

## 2017-05-25 DIAGNOSIS — Z9049 Acquired absence of other specified parts of digestive tract: Secondary | ICD-10-CM | POA: Insufficient documentation

## 2017-05-25 DIAGNOSIS — R300 Dysuria: Secondary | ICD-10-CM | POA: Diagnosis present

## 2017-05-25 DIAGNOSIS — Z79899 Other long term (current) drug therapy: Secondary | ICD-10-CM | POA: Diagnosis not present

## 2017-05-25 DIAGNOSIS — F17210 Nicotine dependence, cigarettes, uncomplicated: Secondary | ICD-10-CM | POA: Insufficient documentation

## 2017-05-25 DIAGNOSIS — I509 Heart failure, unspecified: Secondary | ICD-10-CM | POA: Diagnosis not present

## 2017-05-25 DIAGNOSIS — J449 Chronic obstructive pulmonary disease, unspecified: Secondary | ICD-10-CM | POA: Diagnosis not present

## 2017-05-26 ENCOUNTER — Encounter: Payer: Self-pay | Admitting: Emergency Medicine

## 2017-05-26 ENCOUNTER — Other Ambulatory Visit: Payer: Self-pay

## 2017-05-26 ENCOUNTER — Emergency Department
Admission: EM | Admit: 2017-05-26 | Discharge: 2017-05-26 | Disposition: A | Discharge status: Home / Self Care | Payer: Medicare Other | Attending: Emergency Medicine | Admitting: Emergency Medicine

## 2017-05-26 DIAGNOSIS — N3001 Acute cystitis with hematuria: Secondary | ICD-10-CM

## 2017-05-26 LAB — URINALYSIS, ROUTINE W REFLEX MICROSCOPIC
BILIRUBIN URINE: NEGATIVE
Glucose, UA: NEGATIVE mg/dL
Ketones, ur: NEGATIVE mg/dL
Nitrite: POSITIVE — AB
PH: 6 (ref 5.0–8.0)
Protein, ur: 100 mg/dL — AB
RBC / HPF: >50 RBC/hpf — ABNORMAL HIGH (ref 0–5)
SPECIFIC GRAVITY, URINE: 1.02 (ref 1.005–1.030)
WBC, UA: >50 WBC/hpf — ABNORMAL HIGH (ref 0–5)

## 2017-05-26 MED ORDER — CEPHALEXIN 500 MG PO CAPS: 500.0000 mg | ORAL_CAPSULE | Freq: Four times a day (QID) | ORAL | 0 refills | Status: AC

## 2017-05-26 MED ORDER — PHENAZOPYRIDINE HCL 200 MG PO TABS
200.0000 mg | ORAL_TABLET | Freq: Once | ORAL | Status: AC
  Administered 2017-05-26: 200 mg via ORAL

## 2017-05-26 MED ORDER — PHENAZOPYRIDINE HCL 100 MG PO TABS: 95.0000 mg | ORAL_TABLET | Freq: Once | ORAL | Status: DC

## 2017-05-26 MED ORDER — PHENAZOPYRIDINE HCL 200 MG PO TABS
ORAL_TABLET | ORAL | Status: AC
  Filled 2017-05-26: qty 1

## 2017-05-26 MED ORDER — CEFTRIAXONE SODIUM 1 G IJ SOLR
1.0000 g | Freq: Once | INTRAMUSCULAR | Status: AC
  Administered 2017-05-26: 1 g via INTRAMUSCULAR
  Filled 2017-05-26: qty 10

## 2017-05-26 MED ORDER — LIDOCAINE HCL (PF) 1 % IJ SOLN
INTRAMUSCULAR | Status: AC
  Administered 2017-05-26: 2.1 mL
  Filled 2017-05-26: qty 5

## 2017-05-26 MED ORDER — PHENAZOPYRIDINE HCL 100 MG PO TABS: 100.0000 mg | ORAL_TABLET | Freq: Three times a day (TID) | ORAL | 0 refills | Status: AC | PRN

## 2017-05-26 NOTE — ED Triage Notes (Signed)
Reports UTI symptoms for couple weeks, worse over past 2 days with dysuria.

## 2017-05-26 NOTE — ED Notes (Signed)
Pt says she's had intermittent dysuria for several weeks; urinary frequency with increased pain over the last 48 hours; denies fever; denies frank blood in urine;

## 2017-05-26 NOTE — ED Notes (Signed)
Dr webster in to assess pt 

## 2017-05-26 NOTE — ED Notes (Signed)
Pt ambulating in hallway to bathroom to void

## 2017-05-26 NOTE — Discharge Instructions (Addendum)
Please follow up with your primary care physician for further evaluation. Please return with any worsening symptoms, fever, nausea or vomiting.

## 2017-05-26 NOTE — ED Provider Notes (Signed)
[facility] Emergency Department Provider Note   ____________________________________________   First MD Initiated Contact with Patient 05/26/17 [phone]     (approximate)  I have reviewed the triage vital signs and the nursing notes.   HISTORY  Chief Complaint Dysuria    HPI Angela Daniel is a 62 y.o. female who comes into the hospital today because she thinks she has a bladder infection.  The patient has been having pain in her lower abdomen and pain when she urinates.  She is also been going to the restroom frequently.  She has some low back pain in her urine has a foul odor.  The patient reports that a couple of weeks ago she started urinating frequently but the pain started about 2 days ago.  The patient has been taking Advil and ibuprofen without any relief.  She states that her pain is currently an 8 out of 10 in intensity.  She denies any fevers nausea or vomiting.  The patient is here today for evaluation.   Past Medical History:  Diagnosis Date  . Congestive heart failure (HCC)   . COPD (chronic obstructive pulmonary disease) (HCC)   . Degenerative joint disease   . Fibromyalgia   . Hypertension   . Sleep apnea     Patient Active Problem List   Diagnosis Date Noted  . Acute respiratory failure with hypoxia (HCC) 11/07/2014  . COPD exacerbation (HCC) 11/07/2014  . CAP (community acquired pneumonia) 11/07/2014    Past Surgical History:  Procedure Laterality Date  . ABDOMINAL HYSTERECTOMY    . APPENDECTOMY    . CESAREAN SECTION    . CHOLECYSTECTOMY    . HIP SURGERY Left     Prior to Admission medications   Medication Sig Start Date End Date Taking? Authorizing Provider  albuterol (PROVENTIL HFA;VENTOLIN HFA) 108 (90 BASE) MCG/ACT inhaler Inhale 2 puffs into the lungs every 4 (four) hours.   Yes [provider]  alprazolam Prudy Feeler) 2 MG tablet Take 2 mg by mouth 3 (three) times daily as needed for sleep.   Yes [provider]  amphetamine-dextroamphetamine (ADDERALL) 30 MG tablet Take 30 mg by mouth 2 (two) times daily.   Yes [provider]  ARIPiprazole (ABILIFY) 15 MG tablet Take 15 mg by mouth daily.   Yes [provider]  Fluticasone-Salmeterol (ADVAIR) 250-50 MCG/DOSE AEPB Inhale 1 puff into the lungs 2 (two) times daily.   Yes [provider]  gabapentin (NEURONTIN) 400 MG capsule Take 800 mg by mouth 3 (three) times daily.   Yes [provider]  lidocaine (LIDODERM) 5 % Place 1 patch onto the skin every 12 (twelve) hours. Remove & Discard patch within 12 hours or as directed by MD 01/30/17 01/30/18 Yes Willy Eddy, MD  meloxicam (MOBIC) 15 MG tablet Take 1 tablet (15 mg total) by mouth daily. 01/30/17  Yes Loleta Rose, MD  pantoprazole (PROTONIX) 40 MG tablet Take 40 mg by mouth 2 (two) times daily.   Yes [provider]  pravastatin (PRAVACHOL) 40 MG tablet Take 1 tablet (40 mg total) by mouth daily. 11/09/14  Yes Altamese Dilling, MD  tiotropium (SPIRIVA) 18 MCG inhalation capsule Place 18 mcg into inhaler and inhale daily.   Yes [provider]  cephALEXin (KEFLEX) 500 MG capsule Take 1 capsule (500 mg total) by mouth 4 (four) times daily for 10 days. 05/26/17 06/05/17  Rebecka Apley, MD  phenazopyridine (PYRIDIUM) 100 MG tablet Take 1 tablet (100 mg  total) by mouth 3 (three) times daily as needed for pain. 05/26/17 05/26/18  Rebecka Apley, MD    Allergies Tramadol; Tylenol [acetaminophen]; and Vicodin [hydrocodone-acetaminophen]  Family History  Problem Relation Age of Onset  . CVA Mother   . Lung cancer Mother   . Diabetes Mother   . Heart attack Father   . Hypertension Father   . Lung cancer Father   . Asthma Father     Social History Social History   Tobacco Use  . Smoking status: Current Every Day Smoker    Packs/day: 0.50    Types: Cigarettes  . Smokeless tobacco: Never Used  Substance Use Topics  .  Alcohol use: No  . Drug use: No    Review of Systems  Constitutional: No fever/chills Eyes: No visual changes. ENT: No sore throat. Cardiovascular: Denies chest pain. Respiratory: Denies shortness of breath. Gastrointestinal:  abdominal pain.  No nausea, no vomiting.  No diarrhea.  No constipation. Genitourinary: dysuria, frequency, urgency Musculoskeletal:  back pain. Skin: Negative for rash. Neurological: Negative for headaches, focal weakness or numbness.   ____________________________________________   PHYSICAL EXAM:  VITAL SIGNS: ED Triage Vitals  Enc Vitals Group     BP 05/26/17 0005 (!) 153/88     Pulse Rate 05/26/17 0005 100     Resp --      Temp 05/26/17 0005 98 F (36.7 C)     Temp Source 05/26/17 0005 Oral     SpO2 05/26/17 0005 100 %     Weight 05/26/17 0003 145 lb (65.8 kg)     Height 05/26/17 0003  (1.6 m)     Head Circumference --      Peak Flow --      Pain Score 05/26/17 0005 8     Pain Loc --      Pain Edu? --      Excl. in GC? --     Constitutional: Alert and oriented. Well appearing and in moderate distress. Eyes: Conjunctivae are normal. PERRL. EOMI. Head: Atraumatic. Nose: No congestion/rhinnorhea. Mouth/Throat: Mucous membranes are moist.  Oropharynx non-erythematous. Cardiovascular: Normal rate, regular rhythm. Grossly normal heart sounds.  Good peripheral circulation. Respiratory: Normal respiratory effort.  No retractions. Lungs CTAB. Gastrointestinal: Soft and nontender. No distention.  Positive bowel sounds Musculoskeletal: No lower extremity tenderness nor edema.   Neurologic:  Normal speech and language.  Skin:  Skin is warm, dry and intact.  Psychiatric: Mood and affect are normal.   ____________________________________________   LABS (all labs ordered are listed, but only abnormal results are displayed)  Labs Reviewed  URINALYSIS, ROUTINE W REFLEX MICROSCOPIC - Abnormal; Notable for the following components:       Result Value   Color, Urine YELLOW (*)    APPearance CLOUDY (*)    Hgb urine dipstick MODERATE (*)    Protein, ur 100 (*)    Nitrite POSITIVE (*)    Leukocytes, UA MODERATE (*)    RBC / HPF >50 (*)    WBC, UA >50 (*)    Bacteria, UA FEW (*)    All other components within normal limits  URINE CULTURE   ____________________________________________  EKG  none ____________________________________________  RADIOLOGY  ED MD interpretation:  none  Official radiology report(s): No results found.  ____________________________________________   PROCEDURES  Procedure(s) performed: None  Procedures  Critical Care performed: No  ____________________________________________   INITIAL IMPRESSION / ASSESSMENT AND PLAN / ED COURSE  As part of my medical decision making,  I reviewed the following data within the electronic MEDICAL RECORD NUMBER Notes from prior ED visits and [place] Controlled Substance Database   This is a 62 year old female who comes into the hospital today with some pain with urination, lower back pain, lower abdominal pain and foul-smelling urine.  The concern is that the patient has urinary tract infection.  We did check a urinalysis and the patient has positive nitrites with moderate leukocyte esterase and red blood cells and white blood cells in the patient's urine.  She has few bacteria.  It appears that she does have a urinary tract infection.  I will give the patient a shot of ceftriaxone and some Pyridium.  She will be discharged home to follow-up with her primary care physician.      ____________________________________________   FINAL CLINICAL IMPRESSION(S) / ED DIAGNOSES  Final diagnoses:  Acute cystitis with hematuria     ED Discharge Orders        Ordered    phenazopyridine (PYRIDIUM) 100 MG tablet  3 times daily PRN     05/26/17 0402    cephALEXin (KEFLEX) 500 MG capsule  4 times daily     05/26/17 0402       Note:  This document was  prepared using Dragon voice recognition software and may include unintentional dictation errors.    Rebecka Apley, MD 05/26/17 [phone]

## 2017-05-28 LAB — URINE CULTURE: Culture: >=100000 — AB

## 2017-11-28 ENCOUNTER — Other Ambulatory Visit: Payer: Self-pay | Admitting: Family Medicine

## 2017-11-28 DIAGNOSIS — Z1231 Encounter for screening mammogram for malignant neoplasm of breast: Secondary | ICD-10-CM

## 2017-12-04 ENCOUNTER — Ambulatory Visit
Admission: RE | Admit: 2017-12-04 | Discharge: 2017-12-04 | Disposition: A | Discharge status: Home / Self Care | Payer: Medicare Other | Source: Ambulatory Visit | Attending: Family Medicine | Admitting: Family Medicine

## 2017-12-04 DIAGNOSIS — Z1231 Encounter for screening mammogram for malignant neoplasm of breast: Secondary | ICD-10-CM | POA: Insufficient documentation

## 2017-12-17 ENCOUNTER — Other Ambulatory Visit: Payer: Self-pay | Admitting: Physician Assistant

## 2017-12-17 DIAGNOSIS — M5136 Other intervertebral disc degeneration, lumbar region: Secondary | ICD-10-CM

## 2018-01-09 ENCOUNTER — Ambulatory Visit: Payer: Medicare Other

## 2018-01-16 ENCOUNTER — Ambulatory Visit: Payer: Medicare Other

## 2018-02-17 ENCOUNTER — Ambulatory Visit
Admission: RE | Admit: 2018-02-17 | Discharge: 2018-02-17 | Disposition: A | Discharge status: Home / Self Care | Payer: Medicare Other | Attending: Physician Assistant | Admitting: Physician Assistant

## 2018-02-17 ENCOUNTER — Ambulatory Visit
Admission: RE | Admit: 2018-02-17 | Discharge: 2018-02-17 | Disposition: A | Discharge status: Home / Self Care | Payer: Medicare Other | Source: Ambulatory Visit | Attending: Physician Assistant | Admitting: Physician Assistant

## 2018-02-17 ENCOUNTER — Other Ambulatory Visit: Payer: Self-pay | Admitting: Physician Assistant

## 2018-02-17 DIAGNOSIS — M5136 Other intervertebral disc degeneration, lumbar region: Secondary | ICD-10-CM

## 2018-08-10 ENCOUNTER — Emergency Department
Admission: EM | Admit: 2018-08-10 | Discharge: 2018-08-10 | Disposition: A | Discharge status: Home / Self Care | Payer: Medicare Other | Attending: Emergency Medicine | Admitting: Emergency Medicine

## 2018-08-10 ENCOUNTER — Other Ambulatory Visit: Payer: Self-pay

## 2018-08-10 ENCOUNTER — Emergency Department: Payer: Medicare Other

## 2018-08-10 DIAGNOSIS — F17210 Nicotine dependence, cigarettes, uncomplicated: Secondary | ICD-10-CM | POA: Diagnosis not present

## 2018-08-10 DIAGNOSIS — I509 Heart failure, unspecified: Secondary | ICD-10-CM | POA: Diagnosis not present

## 2018-08-10 DIAGNOSIS — M5442 Lumbago with sciatica, left side: Secondary | ICD-10-CM | POA: Insufficient documentation

## 2018-08-10 DIAGNOSIS — M79605 Pain in left leg: Secondary | ICD-10-CM | POA: Insufficient documentation

## 2018-08-10 DIAGNOSIS — N3090 Cystitis, unspecified without hematuria: Secondary | ICD-10-CM | POA: Insufficient documentation

## 2018-08-10 DIAGNOSIS — I110 Hypertensive heart disease with heart failure: Secondary | ICD-10-CM | POA: Insufficient documentation

## 2018-08-10 DIAGNOSIS — J449 Chronic obstructive pulmonary disease, unspecified: Secondary | ICD-10-CM | POA: Insufficient documentation

## 2018-08-10 DIAGNOSIS — M545 Low back pain: Secondary | ICD-10-CM | POA: Diagnosis present

## 2018-08-10 LAB — URINALYSIS, COMPLETE (UACMP) WITH MICROSCOPIC
Glucose, UA: NEGATIVE mg/dL
Ketones, ur: NEGATIVE mg/dL
Leukocytes,Ua: NEGATIVE
Nitrite: NEGATIVE
Protein, ur: 30 mg/dL — AB
Specific Gravity, Urine: 1.027 (ref 1.005–1.030)
pH: 5 (ref 5.0–8.0)

## 2018-08-10 LAB — GLUCOSE, CAPILLARY: Glucose-Capillary: 93 mg/dL (ref 70–99)

## 2018-08-10 MED ORDER — LIDOCAINE 5 % EX PTCH
1.0000 | MEDICATED_PATCH | CUTANEOUS | Status: DC
  Administered 2018-08-10: 1 via TRANSDERMAL
  Filled 2018-08-10: qty 1

## 2018-08-10 MED ORDER — CEPHALEXIN 500 MG PO CAPS: 500.0000 mg | ORAL_CAPSULE | Freq: Three times a day (TID) | ORAL | 0 refills | Status: AC

## 2018-08-10 MED ORDER — CEPHALEXIN 500 MG PO CAPS: 500.0000 mg | ORAL_CAPSULE | Freq: Once | ORAL | Status: DC

## 2018-08-10 MED ORDER — KETOROLAC TROMETHAMINE 30 MG/ML IJ SOLN
30.0000 mg | Freq: Once | INTRAMUSCULAR | Status: AC
  Administered 2018-08-10: 30 mg via INTRAMUSCULAR
  Filled 2018-08-10: qty 1

## 2018-08-10 MED ORDER — KETOROLAC TROMETHAMINE 10 MG PO TABS: 10.0000 mg | ORAL_TABLET | Freq: Four times a day (QID) | ORAL | 0 refills | Status: AC | PRN

## 2018-08-10 MED ORDER — OXYCODONE HCL 5 MG PO TABS
5.0000 mg | ORAL_TABLET | Freq: Once | ORAL | Status: AC
  Administered 2018-08-10: 5 mg via ORAL
  Filled 2018-08-10: qty 1

## 2018-08-10 NOTE — ED Notes (Signed)
Pt not in room for discharge instructions    She was witnessed walking to bathroom and then out the door w/o diff

## 2018-08-10 NOTE — ED Provider Notes (Signed)
[facility] Emergency Department Provider Note  ____________________________________________  Time seen: Approximately 12:17 PM  I have reviewed the triage vital signs and the nursing notes.   HISTORY  Chief Complaint Back Pain and Leg Pain    HPI Angela Daniel is a 63 y.o. female that presents to the emergency department for evaluation of left back pain that radiates into left leg for 2 days.  Pain is worse when she tries to walk and puts pressure on her left foot.  No trauma.  Patient has a history of chronic back pain and fibromyalgia.  No bowel or bladder dysfunction or saddle anesthesias.  No history of a kidney stone.  No fevers, nausea, vomiting, abdominal pain, dysuria, hematuria.  Past Medical History:  Diagnosis Date  . Congestive heart failure ([place])   . COPD (chronic obstructive pulmonary disease) ([place])   . Degenerative joint disease   . Fibromyalgia   . Hypertension   . Sleep apnea     Patient Active Problem List   Diagnosis Date Noted  . Acute respiratory failure with hypoxia ([place]) 11/07/2014  . COPD exacerbation ([place]) 11/07/2014  . CAP (community acquired pneumonia) 11/07/2014    Past Surgical History:  Procedure Laterality Date  . ABDOMINAL HYSTERECTOMY    . APPENDECTOMY    . CESAREAN SECTION    . CHOLECYSTECTOMY    . HIP SURGERY Left     Prior to Admission medications   Medication Sig Start Date End Date Taking? Authorizing Provider  albuterol (PROVENTIL HFA;VENTOLIN HFA) 108 (90 BASE) MCG/ACT inhaler Inhale 2 puffs into the lungs every 4 (four) hours.    [provider]  alprazolam Duanne Moron) 2 MG tablet Take 2 mg by mouth 3 (three) times daily as needed for sleep.    [provider]  amphetamine-dextroamphetamine (ADDERALL) 30 MG tablet Take 30 mg by mouth 2 (two) times daily.    [provider]  ARIPiprazole (ABILIFY) 15 MG tablet Take 15 mg by mouth daily.    [provider]  cephALEXin  (KEFLEX) 500 MG capsule Take 1 capsule (500 mg total) by mouth 3 (three) times daily for 7 days. 08/10/18 08/17/18  Laban Emperor, PA-C  Fluticasone-Salmeterol (ADVAIR) 250-50 MCG/DOSE AEPB Inhale 1 puff into the lungs 2 (two) times daily.    [provider]  gabapentin (NEURONTIN) 400 MG capsule Take 800 mg by mouth 3 (three) times daily.    [provider]  ketorolac (TORADOL) 10 MG tablet Take 1 tablet (10 mg total) by mouth every 6 (six) hours as needed for up to 2 days. 08/10/18 08/12/18  Laban Emperor, PA-C  meloxicam (MOBIC) 15 MG tablet Take 1 tablet (15 mg total) by mouth daily. 01/30/17   Hinda Kehr, MD  pantoprazole (PROTONIX) 40 MG tablet Take 40 mg by mouth 2 (two) times daily.    [provider]  pravastatin (PRAVACHOL) 40 MG tablet Take 1 tablet (40 mg total) by mouth daily. 11/09/14   Vaughan Basta, MD  tiotropium (SPIRIVA) 18 MCG inhalation capsule Place 18 mcg into inhaler and inhale daily.    [provider]    Allergies Tramadol, Tylenol [acetaminophen], and Vicodin [hydrocodone-acetaminophen]  Family History  Problem Relation Age of Onset  . CVA Mother   . Lung cancer Mother   . Diabetes Mother   . Heart attack Father   . Hypertension Father   . Lung cancer Father   . Asthma Father   . Breast cancer Neg Hx  Social History Social History   Tobacco Use  . Smoking status: Current Every Day Smoker    Packs/day: 0.50    Types: Cigarettes  . Smokeless tobacco: Never Used  Substance Use Topics  . Alcohol use: No  . Drug use: No     Review of Systems  Constitutional: No fever/chills Gastrointestinal: No abdominal pain.  No nausea, no vomiting.  Genitourinary: Negative for dysuria. Musculoskeletal: Positive for back pain. Skin: Negative for rash, abrasions, lacerations, ecchymosis. Neurological: Negative for numbness or tingling   ____________________________________________   PHYSICAL EXAM:  VITAL  SIGNS: ED Triage Vitals  Enc Vitals Group     BP 08/10/18 1138 105/72     Pulse Rate 08/10/18 1138 63     Resp 08/10/18 1138 20     Temp 08/10/18 1138 99.1 F (37.3 C)     Temp Source 08/10/18 1138 Oral     SpO2 08/10/18 1138 96 %     Weight 08/10/18 1139 155 lb (70.3 kg)     Height 08/10/18 1139 5\' 3"  (1.6 m)     Head Circumference --      Peak Flow --      Pain Score 08/10/18 1139 8     Pain Loc --      Pain Edu? --      Excl. in GC? --      Constitutional: Alert and oriented. Well appearing and in no acute distress. Eyes: Conjunctivae are normal. PERRL. EOMI. Head: Atraumatic. ENT:      Ears:      Nose: No congestion/rhinnorhea.      Mouth/Throat: Mucous membranes are moist.  Neck: No stridor. Cardiovascular: Normal rate, regular rhythm.  Good peripheral circulation.  Symmetric dorsalis pedis pulses bilaterally. Respiratory: Normal respiratory effort without tachypnea or retractions. Lungs CTAB. Good air entry to the bases with no decreased or absent breath sounds. Gastrointestinal: Bowel sounds 4 quadrants. Soft and nontender to palpation. No guarding or rigidity. No palpable masses. No distention. No CVA tenderness. Musculoskeletal: Full range of motion to all extremities. No gross deformities appreciated.  No tenderness to palpation of lumbar spine.  Tenderness to palpation to left SI joint.  Strength equal in lower extremities bilaterally.  Normal but slow gait.  Able to ambulate herself to the bathroom. Neurologic:  Normal speech and language. No gross focal neurologic deficits are appreciated.  Skin:  Skin is warm, dry and intact. No rash noted. Psychiatric: Mood and affect are normal. Speech and behavior are normal. Patient exhibits appropriate insight and judgement.   ____________________________________________   LABS (all labs ordered are listed, but only abnormal results are displayed)  Labs Reviewed  URINALYSIS, COMPLETE (UACMP) WITH MICROSCOPIC - Abnormal;  Notable for the following components:      Result Value   Color, Urine AMBER (*)    APPearance CLOUDY (*)    Hgb urine dipstick SMALL (*)    Bilirubin Urine SMALL (*)    Protein, ur 30 (*)    Bacteria, UA MANY (*)    All other components within normal limits  URINE CULTURE  GLUCOSE, CAPILLARY  CBG MONITORING, ED   ____________________________________________  EKG   ____________________________________________  RADIOLOGY Lexine BatonI, Hallis Meditz, personally viewed and evaluated these images (plain radiographs) as part of my medical decision making, as well as reviewing the written report by the radiologist.  Dg Lumbar Spine 2-3 Views  Result Date: 08/10/2018 CLINICAL DATA:  Low back pain with pain radiating down left leg. EXAM: LUMBAR SPINE -  2-3 VIEW COMPARISON:  02/17/2018. FINDINGS: Surgical clips right upper quadrant. Aortoiliac and left renal vascular calcification again noted. No acute bony abnormality identified. No evidence of fracture dislocation. Total left hip replacement. Bibasilar atelectasis/infiltrates. IMPRESSION: 1. Degenerative change lumbar spine. No acute bony abnormality. Normal alignment. 2.  Aortoiliac and left renal vascular disease. Electronically Signed   By: Maisie Fushomas  Register   On: 08/10/2018 13:01   [place] Venous Img Lower Unilateral Left  Result Date: 08/10/2018 CLINICAL DATA:  Pain and swelling left lower extremity. EXAM: LEFT LOWER EXTREMITY VENOUS DOPPLER ULTRASOUND TECHNIQUE: Gray-scale sonography with graded compression, as well as color Doppler and duplex ultrasound were performed to evaluate the lower extremity deep venous systems from the level of the common femoral vein and including the common femoral, femoral, profunda femoral, popliteal and calf veins including the posterior tibial, peroneal and gastrocnemius veins when visible. The superficial great saphenous vein was also interrogated. Spectral Doppler was utilized to evaluate flow at rest and with distal  augmentation maneuvers in the common femoral, femoral and popliteal veins. COMPARISON:  No recent. FINDINGS: Contralateral Common Femoral Vein: Respiratory phasicity is normal and symmetric with the symptomatic side. No evidence of thrombus. Normal compressibility. Common Femoral Vein: No evidence of thrombus. Normal compressibility, respiratory phasicity and response to augmentation. Saphenofemoral Junction: No evidence of thrombus. Normal compressibility and flow on color Doppler imaging. Profunda Femoral Vein: No evidence of thrombus. Normal compressibility and flow on color Doppler imaging. Femoral Vein: No evidence of thrombus. Normal compressibility, respiratory phasicity and response to augmentation. Popliteal Vein: No evidence of thrombus. Normal compressibility, respiratory phasicity and response to augmentation. Calf Veins: No evidence of thrombus. Normal compressibility and flow on color Doppler imaging. Superficial Great Saphenous Vein: No evidence of thrombus. Normal compressibility. Other Findings:  None. IMPRESSION: No evidence of deep venous thrombosis. Electronically Signed   By: Maisie Fushomas  Register   On: 08/10/2018 13:46    ____________________________________________    PROCEDURES  Procedure(s) performed:    Procedures    Medications  lidocaine (LIDODERM) 5 % 1 patch (1 patch Transdermal Patch Applied 08/10/18 1302)  cephALEXin (KEFLEX) capsule 500 mg (500 mg Oral Not Given 08/10/18 1524)  oxyCODONE (Oxy IR/ROXICODONE) immediate release tablet 5 mg (5 mg Oral Given 08/10/18 1302)  ketorolac (TORADOL) 30 MG/ML injection 30 mg (30 mg Intramuscular Given 08/10/18 1303)     ____________________________________________   INITIAL IMPRESSION / ASSESSMENT AND PLAN / ED COURSE  Pertinent labs & imaging results that were available during my care of the patient were reviewed by me and considered in my medical decision making (see chart for details).  Review of the [place] CSRS was  performed in accordance of the [facility] prior to dispensing any controlled drugs.     Patient presented the emergency department for evaluation of low back pain that radiates in the left leg for 2 days.  Vital signs and exam are reassuring.  Lumbar x-ray consistent with degenerative changes.  Ultrasound negative for DVT.  Urinalysis consistent with cystitis.  Back pain significantly improved with Percocet and Toradol.  Patient states that she almost has no back pain after medications.  Patient left the emergency department after discussing imaging and urinalysis results.  Prescriptions for Toradol and Keflex were sent in.   Angela Daniel was evaluated in Emergency Department on 08/10/2018 for the symptoms described in the history of present illness. She was evaluated in the context of the global COVID-19 pandemic, which necessitated consideration that the patient might be at risk  for infection with the SARS-CoV-2 virus that causes COVID-19. Institutional protocols and algorithms that pertain to the evaluation of patients at risk for COVID-19 are in a state of rapid change based on information released by regulatory bodies including the CDC and federal and state organizations. These policies and algorithms were followed during the patient's care in the ED.  ____________________________________________  FINAL CLINICAL IMPRESSION(S) / ED DIAGNOSES  Final diagnoses:  Acute left-sided low back pain with left-sided sciatica  Cystitis      NEW MEDICATIONS STARTED DURING THIS VISIT:  ED Discharge Orders         Ordered    ketorolac (TORADOL) 10 MG tablet  Every 6 hours PRN     08/10/18 1519    cephALEXin (KEFLEX) 500 MG capsule  3 times daily     08/10/18 1519              This chart was dictated using voice recognition software/Dragon. Despite best efforts to proofread, errors can occur which can change the meaning. Any change was purely unintentional.    Enid DerryWagner, Azarion Hove, PA-C 08/10/18  1741    Jene EveryKinner, Robert, MD 08/12/18 1944

## 2018-08-10 NOTE — ED Notes (Signed)
Provider in with pt

## 2018-08-10 NOTE — ED Notes (Signed)
See triage note  Presents with lower back pain which is moving into left leg  Denies any recent injury   States pain started couple of days ago  Hx of fibromyalgia and osteo

## 2018-08-10 NOTE — Discharge Instructions (Signed)
Your x-ray shows that you have some arthritis in your back.  Your ultrasound is negative for a blood clot.  Your urine shows that you do have an infection.  I have sent this off for culture to make sure that the antibiotics will treat this.  Please use heat to your back today.  Please call primary care for an appointment this week.  Please return to the emergency department for any worsening of symptoms.

## 2018-08-10 NOTE — ED Triage Notes (Signed)
Left and right sided mid to lower back pain, radiating down left leg. Pain began yesterday, progressing today. Denies urinary sx. Trouble walking due to pain.

## 2018-08-11 LAB — URINE CULTURE

## 2018-09-08 ENCOUNTER — Encounter: Payer: Self-pay | Admitting: *Deleted

## 2018-09-08 ENCOUNTER — Other Ambulatory Visit: Payer: Self-pay

## 2018-09-08 DIAGNOSIS — I509 Heart failure, unspecified: Secondary | ICD-10-CM | POA: Insufficient documentation

## 2018-09-08 DIAGNOSIS — F17210 Nicotine dependence, cigarettes, uncomplicated: Secondary | ICD-10-CM | POA: Insufficient documentation

## 2018-09-08 DIAGNOSIS — M5410 Radiculopathy, site unspecified: Secondary | ICD-10-CM | POA: Diagnosis not present

## 2018-09-08 DIAGNOSIS — R109 Unspecified abdominal pain: Secondary | ICD-10-CM | POA: Diagnosis present

## 2018-09-08 DIAGNOSIS — M479 Spondylosis, unspecified: Secondary | ICD-10-CM | POA: Diagnosis not present

## 2018-09-08 DIAGNOSIS — Z79899 Other long term (current) drug therapy: Secondary | ICD-10-CM | POA: Diagnosis not present

## 2018-09-08 DIAGNOSIS — I110 Hypertensive heart disease with heart failure: Secondary | ICD-10-CM | POA: Insufficient documentation

## 2018-09-08 DIAGNOSIS — E119 Type 2 diabetes mellitus without complications: Secondary | ICD-10-CM | POA: Insufficient documentation

## 2018-09-08 DIAGNOSIS — Z7984 Long term (current) use of oral hypoglycemic drugs: Secondary | ICD-10-CM | POA: Insufficient documentation

## 2018-09-08 DIAGNOSIS — J449 Chronic obstructive pulmonary disease, unspecified: Secondary | ICD-10-CM | POA: Insufficient documentation

## 2018-09-08 LAB — COMPREHENSIVE METABOLIC PANEL
ALT: 9 U/L (ref 0–44)
AST: 17 U/L (ref 15–41)
Albumin: 3.6 g/dL (ref 3.5–5.0)
Alkaline Phosphatase: 81 U/L (ref 38–126)
Anion gap: 8 (ref 5–15)
BUN: 12 mg/dL (ref 8–23)
CO2: 30 mmol/L (ref 22–32)
Calcium: 8.8 mg/dL — ABNORMAL LOW (ref 8.9–10.3)
Chloride: 101 mmol/L (ref 98–111)
Creatinine, Ser: 0.71 mg/dL (ref 0.44–1.00)
GFR calc Af Amer: >60 mL/min (ref >60)
GFR calc non Af Amer: >60 mL/min (ref >60)
Glucose, Bld: 118 mg/dL — ABNORMAL HIGH (ref 70–99)
Potassium: 3.2 mmol/L — ABNORMAL LOW (ref 3.5–5.1)
Sodium: 139 mmol/L (ref 135–145)
Total Bilirubin: 0.8 mg/dL (ref 0.3–1.2)
Total Protein: 7.1 g/dL (ref 6.5–8.1)

## 2018-09-08 LAB — URINALYSIS, COMPLETE (UACMP) WITH MICROSCOPIC
Bilirubin Urine: NEGATIVE
Glucose, UA: NEGATIVE mg/dL
Hgb urine dipstick: NEGATIVE
Ketones, ur: NEGATIVE mg/dL
Leukocytes,Ua: NEGATIVE
Nitrite: NEGATIVE
Protein, ur: 30 mg/dL — AB
Specific Gravity, Urine: 1.028 (ref 1.005–1.030)
pH: 5 (ref 5.0–8.0)

## 2018-09-08 LAB — CBC WITH DIFFERENTIAL/PLATELET
Abs Immature Granulocytes: 0.03 10*3/uL (ref 0.00–0.07)
Basophils Absolute: 0 10*3/uL (ref 0.0–0.1)
Basophils Relative: 0 %
Eosinophils Absolute: 0.2 10*3/uL (ref 0.0–0.5)
Eosinophils Relative: 2 %
HCT: 37.7 % (ref 36.0–46.0)
Hemoglobin: 12.3 g/dL (ref 12.0–15.0)
Immature Granulocytes: 0 %
Lymphocytes Relative: 22 %
Lymphs Abs: 2.1 10*3/uL (ref 0.7–4.0)
MCH: 29.9 pg (ref 26.0–34.0)
MCHC: 32.6 g/dL (ref 30.0–36.0)
MCV: 91.7 fL (ref 80.0–100.0)
Monocytes Absolute: 0.5 10*3/uL (ref 0.1–1.0)
Monocytes Relative: 6 %
Neutro Abs: 6.7 10*3/uL (ref 1.7–7.7)
Neutrophils Relative %: 70 %
Platelets: 242 10*3/uL (ref 150–400)
RBC: 4.11 MIL/uL (ref 3.87–5.11)
RDW: 12.9 % (ref 11.5–15.5)
WBC: 9.6 10*3/uL (ref 4.0–10.5)
nRBC: 0 % (ref 0.0–0.2)

## 2018-09-08 NOTE — ED Notes (Signed)
Pt's NP called to report pt taking keflex for last 6 days for dx UTI; having c/o left flank pain radiating into left abd accomp by dark urine with diff urinating; self for eval of increasing pain; st pt reportedly was told she had a kidney stone last visit but NP could find no supporting tests performed to dx with such

## 2018-09-08 NOTE — ED Triage Notes (Signed)
Pt to ED reporting left lower back pain since earlier this morning with increased pain when moving. Blood noted in urine 3 days ago and pt is currently taking antibiotics for a UTI that she is scheduled to finish tomorrow. No fevers at home. No further irritation with urination per pt.

## 2018-09-09 ENCOUNTER — Emergency Department: Payer: Medicare Other

## 2018-09-09 ENCOUNTER — Emergency Department
Admission: EM | Admit: 2018-09-09 | Discharge: 2018-09-09 | Disposition: A | Discharge status: Home / Self Care | Payer: Medicare Other | Attending: Emergency Medicine | Admitting: Emergency Medicine

## 2018-09-09 DIAGNOSIS — M5410 Radiculopathy, site unspecified: Secondary | ICD-10-CM

## 2018-09-09 DIAGNOSIS — M479 Spondylosis, unspecified: Secondary | ICD-10-CM

## 2018-09-09 DIAGNOSIS — R109 Unspecified abdominal pain: Secondary | ICD-10-CM

## 2018-09-09 MED ORDER — OXYCODONE-ACETAMINOPHEN 5-325 MG PO TABS
1.0000 | ORAL_TABLET | Freq: Once | ORAL | Status: AC
  Administered 2018-09-09: 1 via ORAL
  Filled 2018-09-09: qty 1

## 2018-09-09 MED ORDER — HYDROMORPHONE HCL 1 MG/ML IJ SOLN
1.0000 mg | Freq: Once | INTRAMUSCULAR | Status: AC
  Administered 2018-09-09: 1 mg via INTRAVENOUS
  Filled 2018-09-09: qty 1

## 2018-09-09 MED ORDER — CIPROFLOXACIN HCL 500 MG PO TABS: 500.0000 mg | ORAL_TABLET | Freq: Two times a day (BID) | ORAL | 0 refills | Status: AC

## 2018-09-09 MED ORDER — IBUPROFEN 600 MG PO TABS: 600.0000 mg | ORAL_TABLET | Freq: Three times a day (TID) | ORAL | 0 refills | Status: DC | PRN

## 2018-09-09 MED ORDER — SODIUM CHLORIDE 0.9 % IV SOLN
2.0000 g | Freq: Once | INTRAVENOUS | Status: AC
  Administered 2018-09-09: 2 g via INTRAVENOUS
  Filled 2018-09-09: qty 20

## 2018-09-09 MED ORDER — KETOROLAC TROMETHAMINE 30 MG/ML IJ SOLN
15.0000 mg | Freq: Once | INTRAMUSCULAR | Status: AC
  Administered 2018-09-09: 15 mg via INTRAVENOUS
  Filled 2018-09-09: qty 1

## 2018-09-09 MED ORDER — DEXAMETHASONE SODIUM PHOSPHATE 10 MG/ML IJ SOLN
10.0000 mg | Freq: Once | INTRAMUSCULAR | Status: AC
  Administered 2018-09-09: 10 mg via INTRAVENOUS
  Filled 2018-09-09: qty 1

## 2018-09-09 NOTE — ED Notes (Signed)
Patient transported to CT 

## 2018-09-09 NOTE — ED Provider Notes (Signed)
[facility] Emergency Department Provider Note  ____________________________________________   First MD Initiated Contact with Patient 09/09/18 0017     (approximate)  I have reviewed the triage vital signs and the nursing notes.   HISTORY  Chief Complaint Flank Pain    HPI Angela Daniel is a 63 y.o. female  With h/o COPD, CHF, FM, chronic pain, here with left back and side pain. Pt reports that she experienced acute onset of severe back pain upon trying to get up from bed today. She's been treated for a UTI lately and has had persistent burning wit urination, but this has been stable. No fever or chills. She woke up and tried to stand up and experienced severe left sided and midline back pain. The pain is sharp, stabbing, and radiates down her side. She's had no associated n/v. Reports she's had kidney stones in past. No alleviating factors. No vaginal bleeding or discharge. No LE weakness or numbness.        Past Medical History:  Diagnosis Date  . Congestive heart failure (HCC)   . COPD (chronic obstructive pulmonary disease) (HCC)   . Degenerative joint disease   . Fibromyalgia   . Hypertension   . Sleep apnea     Patient Active Problem List   Diagnosis Date Noted  . Acute respiratory failure with hypoxia (HCC) 11/07/2014  . COPD exacerbation (HCC) 11/07/2014  . CAP (community acquired pneumonia) 11/07/2014    Past Surgical History:  Procedure Laterality Date  . ABDOMINAL HYSTERECTOMY    . APPENDECTOMY    . CESAREAN SECTION    . CHOLECYSTECTOMY    . HIP SURGERY Left     Prior to Admission medications   Medication Sig Start Date End Date Taking? Authorizing Provider  albuterol (PROVENTIL HFA;VENTOLIN HFA) 108 (90 BASE) MCG/ACT inhaler Inhale 2 puffs into the lungs every 4 (four) hours.   Yes [provider]  alprazolam Prudy Feeler(XANAX) 2 MG tablet Take 2 mg by mouth 3 (three) times daily as needed for sleep.   Yes [provider]  amphetamine-dextroamphetamine (ADDERALL) 30 MG tablet Take 30 mg by mouth 2 (two) times daily.   Yes [provider]  ARIPiprazole (ABILIFY) 15 MG tablet Take 15 mg by mouth daily.   Yes [provider]  Fluticasone-Salmeterol (ADVAIR) 250-50 MCG/DOSE AEPB Inhale 1 puff into the lungs 2 (two) times daily.   Yes [provider]  gabapentin (NEURONTIN) 400 MG capsule Take 800 mg by mouth 3 (three) times daily.   Yes [provider]  meloxicam (MOBIC) 15 MG tablet Take 1 tablet (15 mg total) by mouth daily. 01/30/17  Yes Loleta RoseForbach, Cory, MD  metFORMIN (GLUCOPHAGE) 500 MG tablet Take by mouth 2 (two) times daily with a meal.   Yes [provider]  metoprolol succinate (TOPROL-XL) 25 MG 24 hr tablet Take 25 mg by mouth daily.   Yes [provider]  pantoprazole (PROTONIX) 40 MG tablet Take 40 mg by mouth 2 (two) times daily.   Yes [provider]  pravastatin (PRAVACHOL) 40 MG tablet Take 1 tablet (40 mg total) by mouth daily. 11/09/14  Yes Altamese DillingVachhani, Vaibhavkumar, MD  tiotropium (SPIRIVA) 18 MCG inhalation capsule Place 18 mcg into inhaler and inhale daily.   Yes [provider]  ciprofloxacin (CIPRO) 500 MG tablet Take 1 tablet (500 mg total) by mouth 2 (two) times daily for 7 days. 09/09/18 09/16/18  Shaune PollackIsaacs, Rilda Bulls, MD  ibuprofen (ADVIL) 600 MG tablet Take  1 tablet (600 mg total) by mouth every 8 (eight) hours as needed for moderate pain (back pain). 09/09/18   Shaune PollackIsaacs, Ashley Bultema, MD    Allergies Tramadol, Tylenol [acetaminophen], and Vicodin [hydrocodone-acetaminophen]  Family History  Problem Relation Age of Onset  . CVA Mother   . Lung cancer Mother   . Diabetes Mother   . Heart attack Father   . Hypertension Father   . Lung cancer Father   . Asthma Father   . Breast cancer Neg Hx     Social History Social History   Tobacco Use  . Smoking status: Current Every Day Smoker    Packs/day: 0.50    Types:  Cigarettes  . Smokeless tobacco: Never Used  Substance Use Topics  . Alcohol use: No  . Drug use: No    Review of Systems  Review of Systems  Constitutional: Positive for fatigue. Negative for fever.  HENT: Negative for congestion and sore throat.   Eyes: Negative for visual disturbance.  Respiratory: Negative for cough and shortness of breath.   Cardiovascular: Negative for chest pain.  Gastrointestinal: Negative for abdominal pain, diarrhea, nausea and vomiting.  Genitourinary: Positive for flank pain.  Musculoskeletal: Positive for back pain. Negative for neck pain.  Skin: Negative for rash and wound.  Neurological: Negative for weakness.  All other systems reviewed and are negative.    ____________________________________________  PHYSICAL EXAM:      VITAL SIGNS: ED Triage Vitals  Enc Vitals Group     BP 09/08/18 2205 138/86     Pulse Rate 09/08/18 2205 93     Resp 09/08/18 2205 16     Temp 09/08/18 2205 98.8 F (37.1 C)     Temp Source 09/08/18 2205 Oral     SpO2 09/08/18 2205 97 %     Weight 09/08/18 2206 143 lb (64.9 kg)     Height 09/08/18 2206 5\' 3"  (1.6 m)     Head Circumference --      Peak Flow --      Pain Score 09/08/18 2205 8     Pain Loc --      Pain Edu? --      Excl. in [place]? --      Physical Exam Vitals signs and nursing note reviewed.  Constitutional:      General: She is not in acute distress.    Appearance: She is well-developed.  HENT:     Head: Normocephalic and atraumatic.  Eyes:     Conjunctiva/sclera: Conjunctivae normal.  Neck:     Musculoskeletal: Neck supple.  Cardiovascular:     Rate and Rhythm: Normal rate and regular rhythm.     Heart sounds: Normal heart sounds. No murmur. No friction rub.  Pulmonary:     Effort: Pulmonary effort is normal. No respiratory distress.     Breath sounds: Normal breath sounds. No wheezing or rales.  Abdominal:     General: There is no distension.     Palpations: Abdomen is soft.      Tenderness: There is abdominal tenderness (suprpaubic, LUQ).  Musculoskeletal:     Comments: TTP over midline and left paraspinal lumbar spine. No deformity ro step-offs.  Skin:    General: Skin is warm.     Capillary Refill: Capillary refill takes less than 2 seconds.     Comments: No rash noted on back or flank  Neurological:     Mental Status: She is alert and oriented to person, place, and time.  Motor: No abnormal muscle tone.       ____________________________________________   LABS (all labs ordered are listed, but only abnormal results are displayed)  Labs Reviewed  URINALYSIS, COMPLETE (UACMP) WITH MICROSCOPIC - Abnormal; Notable for the following components:      Result Value   Color, Urine AMBER (*)    APPearance HAZY (*)    Protein, ur 30 (*)    Bacteria, UA MANY (*)    All other components within normal limits  COMPREHENSIVE METABOLIC PANEL - Abnormal; Notable for the following components:   Potassium 3.2 (*)    Glucose, Bld 118 (*)    Calcium 8.8 (*)    All other components within normal limits  URINE CULTURE  CBC WITH DIFFERENTIAL/PLATELET    ____________________________________________  EKG: None ________________________________________  RADIOLOGY All imaging, including plain films, CT scans, and ultrasounds, independently reviewed by me, and interpretations confirmed via formal radiology reads.  ED MD interpretation:   CT Stone: no acute abnormality  Official radiology report(s): Ct Renal Stone Study  Result Date: 09/09/2018 CLINICAL DATA:  Left flank pain radiating to left abdomen with dark urine in difficulty urinating EXAM: CT ABDOMEN AND PELVIS WITHOUT CONTRAST TECHNIQUE: Multidetector CT imaging of the abdomen and pelvis was performed following the standard protocol without IV contrast. COMPARISON:  CT renal colic 01/30/2017 FINDINGS: Lower chest: Bandlike areas of scarring and architectural distortion are present in both lung bases, the  lingula and included portion of the right middle lobe. Mild basilar bronchiectatic changes are present as well. There is borderline cardiomegaly with biatrial enlargement Hepatobiliary: No focal liver abnormality is seen. Patient is post cholecystectomy. Slight prominence of the biliary tree likely related to reservoir effect. No calcified intraductal gallstones. Pancreas: Unremarkable. No pancreatic ductal dilatation or surrounding inflammatory changes. Spleen: Normal in size without focal abnormality. Adrenals/Urinary Tract: Normal adrenal glands. No visible or contour deforming renal lesions. Few nonobstructing calculi are present in the lower pole left kidney. No visible obstructing urolithiasis though evaluation of the pelvis is somewhat limited due to streak artifact from patient's left hip prosthesis. Urinary bladder is largely decompressed at the time of exam and therefore poorly evaluated by CT imaging. Stomach/Bowel: Distal esophagus, stomach and duodenal sweep are unremarkable. No bowel wall thickening or dilatation. No evidence of obstruction. Appendix is surgically absent Scattered colonic diverticula without focal pericolonic inflammation to suggest diverticulitis. Vascular/Lymphatic: Atherosclerotic plaque within the normal caliber aorta. No suspicious or enlarged lymph nodes in the included lymphatic chains. Reproductive: Uterus is surgically absent. No concerning adnexal lesions. Other: No abdominopelvic free fluid or free gas. No bowel containing hernias. Musculoskeletal: Multilevel degenerative changes are present in the imaged portions of the spine. Facet degenerative changes are most pronounced in the lower lumbar levels. Partially calcified disc protrusion at L4-5 results in at least mild spinal canal and bilateral foraminal stenosis. Fatty atrophy of the rectus abdominus. Postsurgical changes from left total hip arthroplasty without acute periprosthetic complication. Cerclage wires noted in  the proximal femur. IMPRESSION: 1. No acute intra-abdominal process. Specifically, no visible obstructive urolithiasis or hydronephrosis. 2. Nonobstructing left nephrolithiasis. 3. Aortic Atherosclerosis (ICD10-I70.0). Electronically Signed   By: Kreg ShropshirePrice  DeHay M.D.   On: 09/09/2018 02:03    ____________________________________________  PROCEDURES   Procedure(s) performed (including Critical Care):  Procedures  ____________________________________________  INITIAL IMPRESSION / MDM / ASSESSMENT AND PLAN / ED COURSE  As part of my medical decision making, I reviewed the following data within the electronic MEDICAL RECORD NUMBER Notes from prior  ED visits and [place] Controlled Substance Database      *Cybele Maule was evaluated in Emergency Department on 09/09/2018 for the symptoms described in the history of present illness. She was evaluated in the context of the global COVID-19 pandemic, which necessitated consideration that the patient might be at risk for infection with the SARS-CoV-2 virus that causes COVID-19. Institutional protocols and algorithms that pertain to the evaluation of patients at risk for COVID-19 are in a state of rapid change based on information released by regulatory bodies including the CDC and federal and state organizations. These policies and algorithms were followed during the patient's care in the ED.  Some ED evaluations and interventions may be delayed as a result of limited staffing during the pandemic.*   Clinical Course as of Sep 08 256  Wed Sep 09, 2018  0233 63 yo F here with lumbar back and flank pain. Initial DDx includes thoracolumbar radicular pain, kidney stone, pyelonephritis, constipation, colitis. No fever, chills or signs of sepsis.   [CI]  0234 Labs reviewed - WBC normal. Mild hypokalemia likely incidental. UA does show MANY bacteria despite being on ABX and she has ongoign dysuria - will switch to Cipro. Otherwise, CT imaging shows no stone or  obstruction. She does have significant degenerative back dz and I suspect her sx are more so due to this. She has a documented history of this. Otherwise, pain improved and she is tolerating pO. Will switch to cipro in event of possible early pyelo,send UCx, and d/c with NSAIDs. Already on chronic subutex.   [CI]    Clinical Course User Index [CI] Duffy Bruce, MD    Medical Decision Making: As above.  ____________________________________________  FINAL CLINICAL IMPRESSION(S) / ED DIAGNOSES  Final diagnoses:  Radicular pain  Left flank pain  Degenerative joint disease of low back     MEDICATIONS GIVEN DURING THIS VISIT:  Medications  cefTRIAXone (ROCEPHIN) 2 g in sodium chloride 0.9 % 100 mL IVPB (0 g Intravenous Stopped 09/09/18 0224)  HYDROmorphone (DILAUDID) injection 1 mg (1 mg Intravenous Given 09/09/18 0100)  ketorolac (TORADOL) 30 MG/ML injection 15 mg (15 mg Intravenous Given 09/09/18 0100)  oxyCODONE-acetaminophen (PERCOCET/ROXICET) 5-325 MG per tablet 1 tablet (1 tablet Oral Given 09/09/18 0228)  dexamethasone (DECADRON) injection 10 mg (10 mg Intravenous Given 09/09/18 0228)     ED Discharge Orders         Ordered    ibuprofen (ADVIL) 600 MG tablet  Every 8 hours PRN     09/09/18 0216    ciprofloxacin (CIPRO) 500 MG tablet  2 times daily     09/09/18 0216           Note:  This document was prepared using Dragon voice recognition software and may include unintentional dictation errors.   Duffy Bruce, MD 09/09/18 [id number]

## 2018-09-10 LAB — URINE CULTURE
Culture: NO GROWTH
Special Requests: NORMAL

## 2018-10-13 ENCOUNTER — Ambulatory Visit: Payer: Medicare Other | Attending: Neurology

## 2018-10-13 DIAGNOSIS — G4733 Obstructive sleep apnea (adult) (pediatric): Secondary | ICD-10-CM | POA: Insufficient documentation

## 2018-10-13 DIAGNOSIS — F5101 Primary insomnia: Secondary | ICD-10-CM | POA: Diagnosis present

## 2018-10-20 ENCOUNTER — Other Ambulatory Visit: Payer: Self-pay

## 2018-12-04 ENCOUNTER — Other Ambulatory Visit: Payer: Medicare Other | Attending: Family

## 2018-12-30 ENCOUNTER — Ambulatory Visit: Payer: Medicare Other | Attending: Neurology

## 2019-02-09 ENCOUNTER — Other Ambulatory Visit: Payer: Self-pay

## 2019-02-09 ENCOUNTER — Encounter: Payer: Self-pay | Admitting: *Deleted

## 2019-02-09 ENCOUNTER — Emergency Department
Admission: EM | Admit: 2019-02-09 | Discharge: 2019-02-09 | Disposition: A | Discharge status: Home / Self Care | Payer: Medicare Other | Attending: Emergency Medicine | Admitting: Emergency Medicine

## 2019-02-09 DIAGNOSIS — F17210 Nicotine dependence, cigarettes, uncomplicated: Secondary | ICD-10-CM | POA: Diagnosis not present

## 2019-02-09 DIAGNOSIS — J449 Chronic obstructive pulmonary disease, unspecified: Secondary | ICD-10-CM | POA: Insufficient documentation

## 2019-02-09 DIAGNOSIS — M5431 Sciatica, right side: Secondary | ICD-10-CM | POA: Diagnosis not present

## 2019-02-09 DIAGNOSIS — Z79899 Other long term (current) drug therapy: Secondary | ICD-10-CM | POA: Insufficient documentation

## 2019-02-09 DIAGNOSIS — M1990 Unspecified osteoarthritis, unspecified site: Secondary | ICD-10-CM | POA: Diagnosis not present

## 2019-02-09 DIAGNOSIS — M545 Low back pain: Secondary | ICD-10-CM | POA: Diagnosis present

## 2019-02-09 DIAGNOSIS — M797 Fibromyalgia: Secondary | ICD-10-CM | POA: Diagnosis not present

## 2019-02-09 MED ORDER — OXYCODONE HCL 5 MG PO TABS
5.0000 mg | ORAL_TABLET | Freq: Once | ORAL | Status: DC
  Filled 2019-02-09: qty 1

## 2019-02-09 MED ORDER — CYCLOBENZAPRINE HCL 10 MG PO TABS
10.0000 mg | ORAL_TABLET | Freq: Once | ORAL | Status: DC
  Filled 2019-02-09: qty 1

## 2019-02-09 MED ORDER — KETOROLAC TROMETHAMINE 30 MG/ML IJ SOLN
30.0000 mg | Freq: Once | INTRAMUSCULAR | Status: AC
  Administered 2019-02-09: 21:00:00 30 mg via INTRAMUSCULAR
  Filled 2019-02-09: qty 1

## 2019-02-09 MED ORDER — OXYCODONE HCL 5 MG PO TABS
5.0000 mg | ORAL_TABLET | Freq: Once | ORAL | Status: AC
  Administered 2019-02-09: 5 mg via ORAL
  Filled 2019-02-09: qty 1

## 2019-02-09 NOTE — ED Triage Notes (Signed)
Pt here from home with complaints of lower back pain that radiates down her right leg. Pain started a few months ago and comes and goes.

## 2019-02-09 NOTE — ED Provider Notes (Signed)
[facility] Emergency Department Provider Note ____________________________________________  Time seen: Approximately 8:42 PM  I have reviewed the triage vital signs and the nursing notes.  HISTORY  Chief Complaint Back Pain   HPI Angela Daniel is a 64 y.o. female who presents to the emergency department for treatment and evaluation of right side lower back pain that radiates into the right leg. Similar symptoms in the past. She has tried Zanaflex and Ibuprofen without relief. No known injury or recent fall.   Past Medical History:  Diagnosis Date  . Congestive heart failure (HCC)   . COPD (chronic obstructive pulmonary disease) (HCC)   . Degenerative joint disease   . Fibromyalgia   . Hypertension   . Sleep apnea     Patient Active Problem List   Diagnosis Date Noted  . Acute respiratory failure with hypoxia (HCC) 11/07/2014  . COPD exacerbation (HCC) 11/07/2014  . CAP (community acquired pneumonia) 11/07/2014    Past Surgical History:  Procedure Laterality Date  . ABDOMINAL HYSTERECTOMY    . APPENDECTOMY    . CESAREAN SECTION    . CHOLECYSTECTOMY    . HIP SURGERY Left     Prior to Admission medications   Medication Sig Start Date End Date Taking? Authorizing Provider  albuterol (PROVENTIL HFA;VENTOLIN HFA) 108 (90 BASE) MCG/ACT inhaler Inhale 2 puffs into the lungs every 4 (four) hours.    [provider]  alprazolam Prudy Feeler) 2 MG tablet Take 2 mg by mouth 3 (three) times daily as needed for sleep.    [provider]  amphetamine-dextroamphetamine (ADDERALL) 30 MG tablet Take 30 mg by mouth 2 (two) times daily.    [provider]  ARIPiprazole (ABILIFY) 15 MG tablet Take 15 mg by mouth daily.    [provider]  buprenorphine (SUBUTEX) 8 MG SUBL SL tablet Place 8 mg under the tongue 3 (three) times daily. 02/04/19   [provider]  Fluticasone-Salmeterol (ADVAIR) 250-50 MCG/DOSE AEPB Inhale 1 puff  into the lungs 2 (two) times daily.    [provider]  gabapentin (NEURONTIN) 400 MG capsule Take 800 mg by mouth 3 (three) times daily.    [provider]  metFORMIN (GLUCOPHAGE) 500 MG tablet Take by mouth 2 (two) times daily with a meal.    [provider]  metoprolol succinate (TOPROL-XL) 25 MG 24 hr tablet Take 25 mg by mouth daily.    [provider]  pantoprazole (PROTONIX) 40 MG tablet Take 40 mg by mouth 2 (two) times daily.    [provider]  pravastatin (PRAVACHOL) 40 MG tablet Take 1 tablet (40 mg total) by mouth daily. 11/09/14   Altamese Dilling, MD  tiotropium (SPIRIVA) 18 MCG inhalation capsule Place 18 mcg into inhaler and inhale daily.    [provider]    Allergies Tramadol, Tylenol [acetaminophen], and Vicodin [hydrocodone-acetaminophen]  Family History  Problem Relation Age of Onset  . CVA Mother   . Lung cancer Mother   . Diabetes Mother   . Heart attack Father   . Hypertension Father   . Lung cancer Father   . Asthma Father   . Breast cancer Neg Hx     Social History Social History   Tobacco Use  . Smoking status: Current Every Day Smoker    Packs/day: 0.50    Types: Cigarettes  . Smokeless tobacco: Never Used  Substance Use Topics  . Alcohol use: No  . Drug use: No    Review  of Systems Constitutional: Well appearing. Respiratory: Negative for dyspnea. Cardiovascular: Negative for change in skin temperature or color. Musculoskeletal:   Negative for chronic steroid use   Negative for trauma in the presence of osteoporosis  [place] for age over 77 and trauma.  Negative for constitutional symptoms, or history of cancer  Negative for pain worse at night. Skin: Negative for rash, lesion, or wound.  Genitourinary: Negative for urinary retention. Rectal: Negative for fecal incontinence or new onset constipation/bowel habit changes. Hematological/Immunilogical: Negative for  immunosuppression, IV drug use, or fever Neurological: Positive for burning, tingling, numb, electric, radiating pain in the right lower extremity.                        Negative for saddle anesthesia.                        Negative for focal neurologic deficit, progressive or disabling symptoms             Negative for saddle anesthesia. ____________________________________________   PHYSICAL EXAM:  VITAL SIGNS: ED Triage Vitals  Enc Vitals Group     BP 02/09/19 1938 132/66     Pulse Rate 02/09/19 1938 83     Resp 02/09/19 1938 20     Temp 02/09/19 1938 98.5 F (36.9 C)     Temp Source 02/09/19 1938 Oral     SpO2 02/09/19 1938 96 %     Weight 02/09/19 1937 150 lb (68 kg)     Height 02/09/19 1937 5\' 3"  (1.6 m)     Head Circumference --      Peak Flow --      Pain Score 02/09/19 1937 10     Pain Loc --      Pain Edu? --      Excl. in [place]? --     Constitutional: Alert and oriented. Well appearing and in no acute distress. Eyes: Conjunctivae are clear without discharge or drainage.  Head: Atraumatic. Neck: Full, active range of motion. Respiratory: Respirations even and unlabored. Musculoskeletal: Guarded ROM of the right lower extremity, Strength 4/5 of the lower extremities as tested. Neurologic: Reflexes of the lower extremities are 2+. Positive straight leg raise on the right side. Skin: Atraumatic.  Psychiatric: Behavior and affect are normal.  ____________________________________________   LABS (all labs ordered are listed, but only abnormal results are displayed)  Labs Reviewed - No data to display ____________________________________________  RADIOLOGY  Not indicated. ____________________________________________   PROCEDURES  Procedure(s) performed:  Procedures ____________________________________________   INITIAL IMPRESSION / ASSESSMENT AND PLAN / ED COURSE  Angela Daniel is a 64 y.o. female presents to the ER for treatment of acute on chronic  back pain without injury. Pain is in the same location as previous. Radicular symptoms in the posterior right lower extremity midway down thigh and toward the lateral aspect. She states that the medication she received last time provided significant relief. Plan will be to review last ER note.   Differential diagnosis includes, but not limited to: Sciatica, degenerative disc disease, worsening spinal stenosis.  Charts from previous ER visits reviewed as well as PDMP. Last CT shows No fracture, with multilevel degenerative change worse at L4-5 with moderate spinal stenosis.  She denies recent injury and therefore I do not suspect any new findings and therefore no images were ordered at this visit.  Toradol and Flexeril were ordered initially.  Patient declines the Flexeril and states  that she has been taking Zanaflex without any significant relief.  Patient is requesting oxycodone by name and states that this helped her last time.  She is currently on Subutex.  I have advised her that she will not receive a prescription for oxycodone.  She states that she understands this.  I did encourage her to call and schedule follow-up appointment with orthopedics.  She was discharged home with instructions to return to the emergency department for symptoms that change or worsen if she is unable to see her primary care provider or orthopedics.  Medications  cyclobenzaprine (FLEXERIL) tablet 10 mg (10 mg Oral Refused 02/09/19 2044)  oxyCODONE (Oxy IR/ROXICODONE) immediate release tablet 5 mg (has no administration in time range)  ketorolac (TORADOL) 30 MG/ML injection 30 mg (30 mg Intramuscular Given 02/09/19 2046)  oxyCODONE (Oxy IR/ROXICODONE) immediate release tablet 5 mg (5 mg Oral Given 02/09/19 2046)    ED Discharge Orders    None       Pertinent labs & imaging results that were available during my care of the patient were reviewed by me and considered in my medical decision making (see chart for  details).  _________________________________________   FINAL CLINICAL IMPRESSION(S) / ED DIAGNOSES  Final diagnoses:  Sciatica of right side     If controlled substance prescribed during this visit, 12 month history viewed on the [facility] prior to issuing an initial prescription for Schedule II or III opiod.   Chinita Pester, FNP 02/09/19 2235    Chesley Noon, MD 02/10/19 [phone]

## 2019-02-09 NOTE — Discharge Instructions (Signed)
Please call and schedule an appointment with orthopedics.  Continue your medications as prescribed.  Take the prednisone and flexeril for sciatica pain.  Return to the ER for symptoms that change or worsen if unable to schedule an appointment with primary care or orthopedics.

## 2019-05-04 ENCOUNTER — Emergency Department
Admission: EM | Admit: 2019-05-04 | Discharge: 2019-05-04 | Disposition: A | Discharge status: Home / Self Care | Payer: Medicare Other | Attending: Emergency Medicine | Admitting: Emergency Medicine

## 2019-05-04 ENCOUNTER — Encounter: Payer: Self-pay | Admitting: Emergency Medicine

## 2019-05-04 ENCOUNTER — Emergency Department: Payer: Medicare Other

## 2019-05-04 ENCOUNTER — Other Ambulatory Visit: Payer: Self-pay

## 2019-05-04 DIAGNOSIS — Z9049 Acquired absence of other specified parts of digestive tract: Secondary | ICD-10-CM | POA: Diagnosis not present

## 2019-05-04 DIAGNOSIS — I10 Essential (primary) hypertension: Secondary | ICD-10-CM | POA: Diagnosis not present

## 2019-05-04 DIAGNOSIS — Y939 Activity, unspecified: Secondary | ICD-10-CM | POA: Insufficient documentation

## 2019-05-04 DIAGNOSIS — E119 Type 2 diabetes mellitus without complications: Secondary | ICD-10-CM | POA: Insufficient documentation

## 2019-05-04 DIAGNOSIS — Z79899 Other long term (current) drug therapy: Secondary | ICD-10-CM | POA: Diagnosis not present

## 2019-05-04 DIAGNOSIS — S299XXA Unspecified injury of thorax, initial encounter: Secondary | ICD-10-CM | POA: Diagnosis present

## 2019-05-04 DIAGNOSIS — Y929 Unspecified place or not applicable: Secondary | ICD-10-CM | POA: Insufficient documentation

## 2019-05-04 DIAGNOSIS — S22050A Wedge compression fracture of T5-T6 vertebra, initial encounter for closed fracture: Secondary | ICD-10-CM | POA: Insufficient documentation

## 2019-05-04 DIAGNOSIS — J449 Chronic obstructive pulmonary disease, unspecified: Secondary | ICD-10-CM | POA: Insufficient documentation

## 2019-05-04 DIAGNOSIS — Y999 Unspecified external cause status: Secondary | ICD-10-CM | POA: Insufficient documentation

## 2019-05-04 DIAGNOSIS — Z7984 Long term (current) use of oral hypoglycemic drugs: Secondary | ICD-10-CM | POA: Insufficient documentation

## 2019-05-04 DIAGNOSIS — F17210 Nicotine dependence, cigarettes, uncomplicated: Secondary | ICD-10-CM | POA: Insufficient documentation

## 2019-05-04 DIAGNOSIS — X58XXXA Exposure to other specified factors, initial encounter: Secondary | ICD-10-CM | POA: Insufficient documentation

## 2019-05-04 HISTORY — DX: Type 2 diabetes mellitus without complications: E11.9

## 2019-05-04 LAB — CBC
HCT: 41.9 % (ref 36.0–46.0)
Hemoglobin: 13.8 g/dL (ref 12.0–15.0)
MCH: 31.1 pg (ref 26.0–34.0)
MCHC: 32.9 g/dL (ref 30.0–36.0)
MCV: 94.4 fL (ref 80.0–100.0)
Platelets: 233 10*3/uL (ref 150–400)
RBC: 4.44 MIL/uL (ref 3.87–5.11)
RDW: 12.7 % (ref 11.5–15.5)
WBC: 6.1 10*3/uL (ref 4.0–10.5)
nRBC: 0 % (ref 0.0–0.2)

## 2019-05-04 LAB — TROPONIN I (HIGH SENSITIVITY): Troponin I (High Sensitivity): 5 ng/L (ref <18)

## 2019-05-04 LAB — BASIC METABOLIC PANEL
Anion gap: 9 (ref 5–15)
BUN: 17 mg/dL (ref 8–23)
CO2: 27 mmol/L (ref 22–32)
Calcium: 9.3 mg/dL (ref 8.9–10.3)
Chloride: 104 mmol/L (ref 98–111)
Creatinine, Ser: 0.79 mg/dL (ref 0.44–1.00)
GFR calc Af Amer: >60 mL/min (ref >60)
GFR calc non Af Amer: >60 mL/min (ref >60)
Glucose, Bld: 112 mg/dL — ABNORMAL HIGH (ref 70–99)
Potassium: 4 mmol/L (ref 3.5–5.1)
Sodium: 140 mmol/L (ref 135–145)

## 2019-05-04 MED ORDER — SODIUM CHLORIDE 0.9% FLUSH
3.0000 mL | Freq: Once | INTRAVENOUS | Status: AC
  Administered 2019-05-04: 3 mL via INTRAVENOUS

## 2019-05-04 MED ORDER — MORPHINE SULFATE (PF) 4 MG/ML IV SOLN
4.0000 mg | Freq: Once | INTRAVENOUS | Status: AC
  Administered 2019-05-04: 17:00:00 4 mg via INTRAVENOUS
  Filled 2019-05-04: qty 1

## 2019-05-04 MED ORDER — ONDANSETRON HCL 4 MG/2ML IJ SOLN
4.0000 mg | Freq: Once | INTRAMUSCULAR | Status: AC
  Administered 2019-05-04: 4 mg via INTRAVENOUS
  Filled 2019-05-04: qty 2

## 2019-05-04 MED ORDER — IOHEXOL 350 MG/ML SOLN
100.0000 mL | Freq: Once | INTRAVENOUS | Status: AC | PRN
  Administered 2019-05-04: 100 mL via INTRAVENOUS
  Filled 2019-05-04: qty 100

## 2019-05-04 MED ORDER — OXYCODONE HCL 5 MG PO TABS: 5.0000 mg | ORAL_TABLET | Freq: Three times a day (TID) | ORAL | 0 refills | Status: DC | PRN

## 2019-05-04 MED ORDER — NAPROXEN 500 MG PO TABS: 500.0000 mg | ORAL_TABLET | Freq: Two times a day (BID) | ORAL | 2 refills | Status: DC

## 2019-05-04 NOTE — ED Notes (Signed)
Pt verbalized understanding of discharge instructions. NAD at this time. 

## 2019-05-04 NOTE — ED Triage Notes (Signed)
C/O mid scapular pain x 4 days.  Patient had a doctor's appointment this morning and had an EKG, which was abnormal. Referred to ED for evaluation.

## 2019-05-04 NOTE — ED Provider Notes (Addendum)
[name] Emergency Department Provider Note   ____________________________________________    I have reviewed the triage vital signs and the nursing notes.   HISTORY  Chief Complaint Back Pain     HPI Angela Daniel is a 64 y.o. female with history of COPD, diabetes, hypertension who was sent in for evaluation by her PCP because of pain primarily in her upper back.  She did tell her PCP that she had some chest discomfort several days ago as well and they performed an EKG which was apparently abnormal.  Patient denies trauma.  No falls.  Has not take anything for this.  No shortness of breath or pleurisy.  Past Medical History:  Diagnosis Date  . Congestive heart failure (HCC)   . COPD (chronic obstructive pulmonary disease) (HCC)   . Degenerative joint disease   . Diabetes mellitus without complication (HCC)   . Fibromyalgia   . Hypertension   . Sleep apnea     Patient Active Problem List   Diagnosis Date Noted  . Acute respiratory failure with hypoxia (HCC) 11/07/2014  . COPD exacerbation (HCC) 11/07/2014  . CAP (community acquired pneumonia) 11/07/2014    Past Surgical History:  Procedure Laterality Date  . ABDOMINAL HYSTERECTOMY    . APPENDECTOMY    . CESAREAN SECTION    . CHOLECYSTECTOMY    . HIP SURGERY Left     Prior to Admission medications   Medication Sig Start Date End Date Taking? Authorizing Provider  albuterol (PROVENTIL HFA;VENTOLIN HFA) 108 (90 BASE) MCG/ACT inhaler Inhale 2 puffs into the lungs every 4 (four) hours.    [provider]  alprazolam Prudy Feeler) 2 MG tablet Take 2 mg by mouth 3 (three) times daily as needed for sleep.    [provider]  amphetamine-dextroamphetamine (ADDERALL) 30 MG tablet Take 30 mg by mouth 2 (two) times daily.    [provider]  ARIPiprazole (ABILIFY) 15 MG tablet Take 15 mg by mouth daily.    [provider]  buprenorphine (SUBUTEX) 8 MG SUBL SL  tablet Place 8 mg under the tongue 3 (three) times daily. 02/04/19   [provider]  Fluticasone-Salmeterol (ADVAIR) 250-50 MCG/DOSE AEPB Inhale 1 puff into the lungs 2 (two) times daily.    [provider]  gabapentin (NEURONTIN) 400 MG capsule Take 800 mg by mouth 3 (three) times daily.    [provider]  metFORMIN (GLUCOPHAGE) 500 MG tablet Take by mouth 2 (two) times daily with a meal.    [provider]  metoprolol succinate (TOPROL-XL) 25 MG 24 hr tablet Take 25 mg by mouth daily.    [provider]  naproxen (NAPROSYN) 500 MG tablet Take 1 tablet (500 mg total) by mouth 2 (two) times daily with a meal. 05/04/19   Jene Every, MD  oxyCODONE (OXY IR/ROXICODONE) 5 MG immediate release tablet Take 1 tablet (5 mg total) by mouth 3 (three) times daily as needed for severe pain. 05/04/19   Menshew, Charlesetta Ivory, PA-C  pantoprazole (PROTONIX) 40 MG tablet Take 40 mg by mouth 2 (two) times daily.    [provider]  pravastatin (PRAVACHOL) 40 MG tablet Take 1 tablet (40 mg total) by mouth daily. 11/09/14   Altamese Dilling, MD  tiotropium (SPIRIVA) 18 MCG inhalation capsule Place 18 mcg into inhaler and inhale daily.    [provider]     Allergies Tramadol, Tylenol [acetaminophen], and Vicodin [hydrocodone-acetaminophen]  Family History  Problem Relation  Age of Onset  . CVA Mother   . Lung cancer Mother   . Diabetes Mother   . Heart attack Father   . Hypertension Father   . Lung cancer Father   . Asthma Father   . Breast cancer Neg Hx     Social History Social History   Tobacco Use  . Smoking status: Current Every Day Smoker    Packs/day: 0.50    Types: Cigarettes  . Smokeless tobacco: Never Used  Substance Use Topics  . Alcohol use: No  . Drug use: No    Review of Systems  Constitutional: No fever/chills Eyes: No visual changes.  ENT: No sore throat. Cardiovascular: As above Respiratory: Denies  shortness of breath. Gastrointestinal: No abdominal pain.  No nausea, no vomiting.   Genitourinary: Negative for dysuria. Musculoskeletal: As above Skin: Negative for rash. Neurological: Negative for headaches or weakness   ____________________________________________   PHYSICAL EXAM:  VITAL SIGNS: ED Triage Vitals  Enc Vitals Group     BP 05/04/19 1335 133/87     Pulse Rate 05/04/19 1335 (!) 115     Resp 05/04/19 1335 16     Temp 05/04/19 1335 97.7 F (36.5 C)     Temp Source 05/04/19 1335 Oral     SpO2 05/04/19 1335 99 %     Weight 05/04/19 1342 68 kg (149 lb 14.6 oz)     Height 05/04/19 1342 1.6 m (5\' 3" )     Head Circumference --      Peak Flow --      Pain Score 05/04/19 1342 6     Pain Loc --      Pain Edu? --      Excl. in GC? --     Constitutional: Alert and oriented.   Nose: No congestion/rhinnorhea. Mouth/Throat: Mucous membranes are moist.    Cardiovascular: Normal rate, regular rhythm. Grossly normal heart sounds.  Good peripheral circulation. Respiratory: Normal respiratory effort.  No retractions. Lungs CTAB. Gastrointestinal: Soft and nontender. No distention.  No CVA tenderness. Genitourinary: deferred Musculoskeletal: Back: Mild para vertebral tenderness mid thoracic spine, normal strength in the lower extremities. Neurologic:  Normal speech and language. No gross focal neurologic deficits are appreciated.  No sensory deficits in the lower extremities, no saddle anesthesia. Skin:  Skin is warm, dry and intact.  Psychiatric: Mood and affect are normal. Speech and behavior are normal.  ____________________________________________   LABS (all labs ordered are listed, but only abnormal results are displayed)  Labs Reviewed  BASIC METABOLIC PANEL - Abnormal; Notable for the following components:      Result Value   Glucose, Bld 112 (*)    All other components within normal limits  CBC  TROPONIN I (HIGH SENSITIVITY)    ____________________________________________  EKG  ED ECG REPORT I, 05/06/19, the attending physician, personally viewed and interpreted this ECG.  Date: 05/04/2019  Rhythm: Sinus tachycardia QRS Axis: normal Intervals: normal ST/T Wave abnormalities: normal Narrative Interpretation: no evidence of acute ischemia  ____________________________________________  RADIOLOGY  Chest x-ray unremarkable CT angiography negative for aortic dissection.  Positive for T6 compression fracture ____________________________________________   PROCEDURES  Procedure(s) performed: No  Procedures   Critical Care performed: No ____________________________________________   INITIAL IMPRESSION / ASSESSMENT AND PLAN / ED COURSE  Pertinent labs & imaging results that were available during my care of the patient were reviewed by me and considered in my medical decision making (see chart for details).  Patient presents with back pain  with some reports of chest pain as well, sent in for EKG abnormalities.  EKG here is overall reassuring however she does have some mild tachycardia.  Cardiac enzymes normal.  Dissection/PE are a possibility given her tachycardia however she does have paraspinal tenderness which leads me believe this could be musculoskeletal in nature.  Treated with IV morphine and IV Zofran with significant improvement  CT angiography performed negative for dissection or PE.  Positive for T6 compression fracture this is likely the cause of her pain.  Will provide analgesics, follow-up with Dr. Rudene Christians    ____________________________________________   FINAL CLINICAL IMPRESSION(S) / ED DIAGNOSES  Final diagnoses:  Compression fracture of T6 vertebra, initial encounter [facility])        Note:  This document was prepared using Dragon voice recognition software and may include unintentional dictation errors.   Lavonia Drafts, MD 05/04/19 2040    Lavonia Drafts, MD 05/04/19  2041

## 2019-05-07 ENCOUNTER — Other Ambulatory Visit: Payer: Self-pay | Admitting: Orthopedic Surgery

## 2019-05-07 DIAGNOSIS — S22050A Wedge compression fracture of T5-T6 vertebra, initial encounter for closed fracture: Secondary | ICD-10-CM

## 2019-05-08 ENCOUNTER — Ambulatory Visit
Admission: RE | Admit: 2019-05-08 | Discharge: 2019-05-08 | Disposition: A | Discharge status: Home / Self Care | Payer: Medicare Other | Source: Ambulatory Visit | Attending: Orthopedic Surgery | Admitting: Orthopedic Surgery

## 2019-05-08 ENCOUNTER — Other Ambulatory Visit: Payer: Self-pay

## 2019-05-08 DIAGNOSIS — S22050A Wedge compression fracture of T5-T6 vertebra, initial encounter for closed fracture: Secondary | ICD-10-CM | POA: Diagnosis present

## 2019-05-11 ENCOUNTER — Other Ambulatory Visit: Payer: Self-pay | Admitting: Orthopedic Surgery

## 2019-05-12 LAB — HM DEXA SCAN

## 2019-05-13 ENCOUNTER — Other Ambulatory Visit: Payer: Self-pay

## 2019-05-13 ENCOUNTER — Encounter
Admission: RE | Admit: 2019-05-13 | Discharge: 2019-05-13 | Disposition: A | Discharge status: Home / Self Care | Payer: Medicare Other | Source: Ambulatory Visit | Attending: Orthopedic Surgery | Admitting: Orthopedic Surgery

## 2019-05-13 HISTORY — DX: Scoliosis, unspecified: M41.9

## 2019-05-13 HISTORY — DX: Gastro-esophageal reflux disease without esophagitis: K21.9

## 2019-05-13 NOTE — Patient Instructions (Signed)
COVID TESTING Date: May 14, 2019 Testing site:  [facility] [address] [address] Thru Hours:  3:76 am - 1:00 pm Once you are tested, you are asked to stay quarantined (avoiding public places) until after your surgery.   Your procedure is scheduled on: May 18, 2019 Tuesday Report to Day Surgery on the 2nd floor of the [facility]. To find out your arrival time, please call [phone] between 1PM - 3PM on: Monday May 17, 2019  REMEMBER: Instructions that are not followed completely may result in serious medical risk, up to and including death; or upon the discretion of your surgeon and anesthesiologist your surgery may need to be rescheduled.  Do not eat food after midnight the night before surgery.  No gum chewing, lozengers or hard candies.  You may however, drink CLEAR liquids up to 2 hours before you are scheduled to arrive for your surgery. Do not drink anything within 2 hours of your scheduled arrival time.  Clear liquids include: - water   Do NOT drink anything that is not on this list.  Type 1 and Type 2 diabetics should only drink water.  GATORADE DRINK:  Complete drinking 2 hours prior to scheduled arrival time.  TAKE THESE MEDICATIONS THE MORNING OF SURGERY WITH A SIP OF WATER: GABAPENTIN NEXIUM (take one the night before and one on the morning of surgery - helps to prevent nausea after surgery.)  Use inhalers on the day of surgery   Stop Metformin  2 days prior to surgery. LAST DOSE May 15, 2019  Follow recommendations from Cardiologist, Pulmonologist or PCP regarding stopping Aspirin. STOP ASPIRIN  Stop Anti-inflammatories (NSAIDS) such as Advil, Aleve, Ibuprofen, Motrin, Naproxen, Naprosyn and Aspirin based products such as Excedrin, Goodys Powder, BC Powder.  Stop ANY OVER THE COUNTER supplements until after surgery. (May continue Vitamin D, Vitamin B)  No Alcohol for 24 hours before or after surgery.  No Smoking including  e-cigarettes for 24 hours prior to surgery.  No chewable tobacco products for at least 6 hours prior to surgery.  No nicotine patches on the day of surgery.  Do not use any "recreational" drugs for at least a week prior to your surgery.  Please be advised that the combination of cocaine and anesthesia may have negative outcomes, up to and including death. If you test positive for cocaine, your surgery will be cancelled.  On the morning of surgery brush your teeth with toothpaste and water, you may rinse your mouth with mouthwash if you wish. Do not swallow any toothpaste or mouthwash.  Do not wear jewelry, make-up, hairpins, clips or nail polish.  Do not wear lotions, powders, or perfumes OR DEODORANT  Do not shave 48 hours prior to surgery.   Contact lenses, hearing aids and dentures may not be worn into surgery.  Do not bring valuables to the hospital. [facility] is not responsible for any missing/lost belongings or valuables.   Use CHG Soap as directed on instruction sheet.  Notify your doctor if there is any change in your medical condition (cold, fever, infection).  Wear comfortable clothing (specific to your surgery type) to the hospital.  Plan for stool softeners for home use; pain medications have a tendency to cause constipation. You can also help prevent constipation by eating foods high in fiber such as fruits and vegetables and drinking plenty of fluids as your diet allows.  After surgery, you can help prevent lung complications by doing breathing exercises.  Take  deep breaths and cough every 1-2 hours. Your doctor may order a device called an Incentive Spirometer to help you take deep breaths. When coughing or sneezing, hold a pillow firmly against your incision with both hands. This is called "splinting." Doing this helps protect your incision. It also decreases belly discomfort.  If you are being discharged the day of surgery, you will not be allowed to drive  home. You will need a responsible adult (18 years or older) to drive you home and stay with you that night.   If you are taking public transportation, you will need to have a responsible adult (18 years or older) with you. Please confirm with your physician that it is acceptable to use public transportation.   Please call the Pre-admissions Testing Dept. at [phone] if you have any questions about these instructions.  Visitation Policy:  Patients undergoing a surgery or procedure may have one family member or support person with them as long as that person is not COVID-19 positive or experiencing its symptoms.  That person may remain in the waiting area during the procedure.  Children under 66 years of age may have both parents or legal guardians with them during their hospital stay.   Inpatient Visitation Update:  Two designated support people may visit a patient during visiting hours 7 am to 8 pm. It must be the same two designated people for the duration of the patient stay. The visitors may come and go during the day, and there is no switching out to have different visitors. A mask must be worn at all times, including in the patient room.

## 2019-05-14 ENCOUNTER — Other Ambulatory Visit
Admission: RE | Admit: 2019-05-14 | Discharge: 2019-05-14 | Disposition: A | Discharge status: Home / Self Care | Payer: Medicare Other | Source: Ambulatory Visit | Attending: Orthopedic Surgery | Admitting: Orthopedic Surgery

## 2019-05-14 DIAGNOSIS — Z01812 Encounter for preprocedural laboratory examination: Secondary | ICD-10-CM | POA: Diagnosis present

## 2019-05-14 DIAGNOSIS — Z20822 Contact with and (suspected) exposure to covid-19: Secondary | ICD-10-CM | POA: Diagnosis not present

## 2019-05-14 LAB — SARS CORONAVIRUS 2 (TAT 6-24 HRS): SARS Coronavirus 2: NEGATIVE

## 2019-05-17 MED ORDER — CEFAZOLIN SODIUM-DEXTROSE 2-4 GM/100ML-% IV SOLN
2.0000 g | INTRAVENOUS | Status: AC
  Administered 2019-05-18: 14:00:00 2 g via INTRAVENOUS

## 2019-05-18 ENCOUNTER — Ambulatory Visit
Admission: RE | Admit: 2019-05-18 | Discharge: 2019-05-18 | Disposition: A | Discharge status: Home / Self Care | Payer: Medicare Other | Attending: Orthopedic Surgery | Admitting: Orthopedic Surgery

## 2019-05-18 ENCOUNTER — Ambulatory Visit: Payer: Medicare Other | Admitting: Anesthesiology

## 2019-05-18 ENCOUNTER — Encounter: Payer: Self-pay | Admitting: Orthopedic Surgery

## 2019-05-18 ENCOUNTER — Ambulatory Visit: Payer: Medicare Other

## 2019-05-18 ENCOUNTER — Other Ambulatory Visit: Payer: Self-pay

## 2019-05-18 ENCOUNTER — Encounter
Admission: RE | Disposition: A | Discharge status: Home / Self Care | Payer: Self-pay | Source: Home / Self Care | Attending: Orthopedic Surgery

## 2019-05-18 DIAGNOSIS — G709 Myoneural disorder, unspecified: Secondary | ICD-10-CM | POA: Insufficient documentation

## 2019-05-18 DIAGNOSIS — I110 Hypertensive heart disease with heart failure: Secondary | ICD-10-CM | POA: Diagnosis not present

## 2019-05-18 DIAGNOSIS — K219 Gastro-esophageal reflux disease without esophagitis: Secondary | ICD-10-CM | POA: Diagnosis not present

## 2019-05-18 DIAGNOSIS — J449 Chronic obstructive pulmonary disease, unspecified: Secondary | ICD-10-CM | POA: Diagnosis not present

## 2019-05-18 DIAGNOSIS — M810 Age-related osteoporosis without current pathological fracture: Secondary | ICD-10-CM | POA: Insufficient documentation

## 2019-05-18 DIAGNOSIS — G4730 Sleep apnea, unspecified: Secondary | ICD-10-CM | POA: Insufficient documentation

## 2019-05-18 DIAGNOSIS — Z79899 Other long term (current) drug therapy: Secondary | ICD-10-CM | POA: Insufficient documentation

## 2019-05-18 DIAGNOSIS — Z833 Family history of diabetes mellitus: Secondary | ICD-10-CM | POA: Diagnosis not present

## 2019-05-18 DIAGNOSIS — Z419 Encounter for procedure for purposes other than remedying health state, unspecified: Secondary | ICD-10-CM

## 2019-05-18 DIAGNOSIS — M4854XA Collapsed vertebra, not elsewhere classified, thoracic region, initial encounter for fracture: Secondary | ICD-10-CM | POA: Insufficient documentation

## 2019-05-18 DIAGNOSIS — F329 Major depressive disorder, single episode, unspecified: Secondary | ICD-10-CM | POA: Insufficient documentation

## 2019-05-18 DIAGNOSIS — Z96642 Presence of left artificial hip joint: Secondary | ICD-10-CM | POA: Insufficient documentation

## 2019-05-18 DIAGNOSIS — M1990 Unspecified osteoarthritis, unspecified site: Secondary | ICD-10-CM | POA: Insufficient documentation

## 2019-05-18 DIAGNOSIS — Z8262 Family history of osteoporosis: Secondary | ICD-10-CM | POA: Diagnosis not present

## 2019-05-18 DIAGNOSIS — Z8249 Family history of ischemic heart disease and other diseases of the circulatory system: Secondary | ICD-10-CM | POA: Diagnosis not present

## 2019-05-18 DIAGNOSIS — E119 Type 2 diabetes mellitus without complications: Secondary | ICD-10-CM | POA: Diagnosis not present

## 2019-05-18 DIAGNOSIS — I509 Heart failure, unspecified: Secondary | ICD-10-CM | POA: Insufficient documentation

## 2019-05-18 HISTORY — PX: KYPHOPLASTY: SHX5884

## 2019-05-18 LAB — GLUCOSE, CAPILLARY
Glucose-Capillary: 105 mg/dL — ABNORMAL HIGH (ref 70–99)
Glucose-Capillary: 84 mg/dL (ref 70–99)

## 2019-05-18 SURGERY — KYPHOPLASTY
Anesthesia: Monitor Anesthesia Care | Site: Back

## 2019-05-18 MED ORDER — EPINEPHRINE PF 1 MG/ML IJ SOLN
INTRAMUSCULAR | Status: AC
  Filled 2019-05-18: qty 1

## 2019-05-18 MED ORDER — LIDOCAINE HCL (CARDIAC) PF 100 MG/5ML IV SOSY
PREFILLED_SYRINGE | INTRAVENOUS | Status: DC | PRN
  Administered 2019-05-18: 80 mg via INTRAVENOUS

## 2019-05-18 MED ORDER — MIDAZOLAM HCL 2 MG/2ML IJ SOLN
INTRAMUSCULAR | Status: DC | PRN
  Administered 2019-05-18 (×2): 1 mg via INTRAVENOUS

## 2019-05-18 MED ORDER — IOHEXOL 180 MG/ML  SOLN
INTRAMUSCULAR | Status: DC | PRN
  Administered 2019-05-18: 20 mL

## 2019-05-18 MED ORDER — ONDANSETRON HCL 4 MG/2ML IJ SOLN: 4.0000 mg | Freq: Once | INTRAMUSCULAR | Status: DC | PRN

## 2019-05-18 MED ORDER — PROPOFOL 10 MG/ML IV BOLUS
INTRAVENOUS | Status: DC | PRN
  Administered 2019-05-18: 40 mg via INTRAVENOUS
  Administered 2019-05-18 (×2): 20 mg via INTRAVENOUS
  Administered 2019-05-18 (×4): 30 mg via INTRAVENOUS
  Administered 2019-05-18: 20 mg via INTRAVENOUS
  Administered 2019-05-18: 30 mg via INTRAVENOUS

## 2019-05-18 MED ORDER — OXYCODONE HCL 5 MG PO TABS
ORAL_TABLET | ORAL | Status: AC
  Administered 2019-05-18: 5 mg via ORAL
  Filled 2019-05-18: qty 1

## 2019-05-18 MED ORDER — MIDAZOLAM HCL 2 MG/2ML IJ SOLN
INTRAMUSCULAR | Status: AC
  Filled 2019-05-18: qty 2

## 2019-05-18 MED ORDER — BUPIVACAINE-EPINEPHRINE (PF) 0.5% -1:200000 IJ SOLN
INTRAMUSCULAR | Status: DC | PRN
  Administered 2019-05-18: 10 mL

## 2019-05-18 MED ORDER — OXYCODONE HCL 5 MG PO TABS: 5.0000 mg | ORAL_TABLET | Freq: Once | ORAL | Status: AC

## 2019-05-18 MED ORDER — SODIUM CHLORIDE 0.9 % IV SOLN: INTRAVENOUS | Status: DC

## 2019-05-18 MED ORDER — BUPIVACAINE HCL (PF) 0.5 % IJ SOLN
INTRAMUSCULAR | Status: AC
  Filled 2019-05-18: qty 30

## 2019-05-18 MED ORDER — FENTANYL CITRATE (PF) 100 MCG/2ML IJ SOLN
25.0000 ug | INTRAMUSCULAR | Status: DC | PRN
  Administered 2019-05-18 (×3): 25 ug via INTRAVENOUS

## 2019-05-18 MED ORDER — LIDOCAINE HCL (PF) 1 % IJ SOLN
INTRAMUSCULAR | Status: AC
  Filled 2019-05-18: qty 30

## 2019-05-18 MED ORDER — LIDOCAINE HCL 1 % IJ SOLN
INTRAMUSCULAR | Status: DC | PRN
  Administered 2019-05-18: 20 mL

## 2019-05-18 MED ORDER — ONDANSETRON HCL 4 MG/2ML IJ SOLN
INTRAMUSCULAR | Status: DC | PRN
  Administered 2019-05-18: 4 mg via INTRAVENOUS

## 2019-05-18 MED ORDER — FENTANYL CITRATE (PF) 100 MCG/2ML IJ SOLN
INTRAMUSCULAR | Status: AC
  Administered 2019-05-18: 15:00:00 25 ug via INTRAVENOUS
  Filled 2019-05-18: qty 2

## 2019-05-18 MED ORDER — PROPOFOL 10 MG/ML IV BOLUS
INTRAVENOUS | Status: AC
  Filled 2019-05-18: qty 40

## 2019-05-18 MED ORDER — CEFAZOLIN SODIUM-DEXTROSE 2-4 GM/100ML-% IV SOLN
INTRAVENOUS | Status: AC
  Filled 2019-05-18: qty 100

## 2019-05-18 SURGICAL SUPPLY — 23 items
CEMENT KYPHON CX01A KIT/MIXER (Cement) ×3 IMPLANT
COVER WAND RF STERILE (DRAPES) ×3 IMPLANT
DERMABOND ADVANCED (GAUZE/BANDAGES/DRESSINGS) ×2
DERMABOND ADVANCED .7 DNX12 (GAUZE/BANDAGES/DRESSINGS) ×1 IMPLANT
DEVICE BIOPSY BONE KYPH (INSTRUMENTS) ×2 IMPLANT
DEVICE BIOPSY BONE KYPHX (INSTRUMENTS) ×3 IMPLANT
DRAPE C-ARM XRAY 36X54 (DRAPES) ×3 IMPLANT
DURAPREP 26ML APPLICATOR (WOUND CARE) ×3 IMPLANT
FEE RENTAL RFA GENERATOR (MISCELLANEOUS) IMPLANT
GLOVE SURG SYN 9.0  PF PI (GLOVE) ×2
GLOVE SURG SYN 9.0 PF PI (GLOVE) ×1 IMPLANT
GOWN SRG 2XL LVL 4 RGLN SLV (GOWNS) ×1 IMPLANT
GOWN STRL NON-REIN 2XL LVL4 (GOWNS) ×2
GOWN STRL REUS W/ TWL LRG LVL3 (GOWN DISPOSABLE) ×1 IMPLANT
GOWN STRL REUS W/TWL LRG LVL3 (GOWN DISPOSABLE) ×2
PACK KYPHOPLASTY (MISCELLANEOUS) ×3 IMPLANT
RENTAL RFA  GENERATOR (MISCELLANEOUS)
RENTAL RFA GENERATOR (MISCELLANEOUS) IMPLANT
STRAP SAFETY 5IN WIDE (MISCELLANEOUS) ×3 IMPLANT
SWABSTK COMLB BENZOIN TINCTURE (MISCELLANEOUS) ×3 IMPLANT
TRAY KYPHOPAK 15/2 EXPRESS (KITS) ×2 IMPLANT
TRAY KYPHOPAK 15/3 EXPRESS 1ST (MISCELLANEOUS) ×1 IMPLANT
TRAY KYPHOPAK 20/3 EXPRESS 1ST (MISCELLANEOUS) ×1 IMPLANT

## 2019-05-18 NOTE — Op Note (Signed)
Date May 18, 2019  time 2:50 PM   PATIENT:  Angela Daniel   PRE-OPERATIVE DIAGNOSIS:  closed wedge compression fracture of T6   POST-OPERATIVE DIAGNOSIS:  closed wedge compression fracture of T6   PROCEDURE:  Procedure(s): KYPHOPLASTY T6  SURGEON: Laurene Footman, MD   ASSISTANTS: None   ANESTHESIA:   local and MAC   EBL:  No intake/output data recorded.   BLOOD ADMINISTERED:none   DRAINS: none    LOCAL MEDICATIONS USED:  MARCAINE    and XYLOCAINE    SPECIMEN:   T6 vertebral body biopsy   DISPOSITION OF SPECIMEN:  Pathology   COUNTS:  YES   TOURNIQUET:  * No tourniquets in log *   IMPLANTS: Bone cement   DICTATION: .Dragon Dictation  patient was brought to the operating room and after adequate anesthesia was obtained the patient was placed prone.  C arm was brought in in good visualization of the affected level obtained on both AP and lateral projections.  After patient identification and timeout procedures were completed, local anesthetic was infiltrated with 10 cc 1% Xylocaine infiltrated subcutaneously.  This is done the area on the each side of the planned approach.  The back was then prepped and draped in the usual sterile manner and repeat timeout procedure carried out.  A spinal needle was brought down to the pedicle on the right side of  T6 and a 50-50 mix of 1% Xylocaine half percent Sensorcaine with epinephrine total of 20 cc injected.  After allowing this to set a small incision was made and the trocar was advanced into the vertebral body in an extrapedicular fashion.  Biopsy was obtained Drilling was carried out balloon inserted with inflation to  2 cc.  When the cement was appropriate consistency 2-1/2 cc were injected into the vertebral body without extravasation, good fill superior to inferior endplates and from right to left sides along the inferior endplate.  After the cement had set the trochar was removed and permanent C-arm views obtained.  The wound was closed  with Dermabond followed by Band-Aid   PLAN OF CARE: Discharge to home after PACU   PATIENT DISPOSITION:  PACU - hemodynamically stable.

## 2019-05-18 NOTE — Anesthesia Preprocedure Evaluation (Signed)
Anesthesia Evaluation  Patient identified by MRN, date of birth, ID band Patient awake    Reviewed: Allergy & Precautions, H&P , NPO status , Patient's Chart, lab work & pertinent test results, reviewed documented beta blocker date and time   Airway Mallampati: II  TM Distance: >3 FB Neck ROM: full    Dental no notable dental hx. (+) Teeth Intact   Pulmonary sleep apnea , pneumonia, resolved, COPD, Current SmokerPatient did not abstain from smoking.,    Pulmonary exam normal breath sounds clear to auscultation       Cardiovascular Exercise Tolerance: Poor hypertension, On Medications +CHF   Rhythm:regular Rate:Normal     Neuro/Psych  Neuromuscular disease negative psych ROS   GI/Hepatic Neg liver ROS, GERD  Medicated,  Endo/Other  negative endocrine ROSdiabetes, Well Controlled, Type 2, Oral Hypoglycemic Agents  Renal/GU      Musculoskeletal   Abdominal   Peds  Hematology negative hematology ROS (+)   Anesthesia Other Findings   Reproductive/Obstetrics negative OB ROS                             Anesthesia Physical Anesthesia Plan  ASA: III  Anesthesia Plan: MAC   Post-op Pain Management:    Induction:   PONV Risk Score and Plan:   Airway Management Planned:   Additional Equipment:   Intra-op Plan:   Post-operative Plan:   Informed Consent: I have reviewed the patients History and Physical, chart, labs and discussed the procedure including the risks, benefits and alternatives for the proposed anesthesia with the patient or authorized representative who has indicated his/her understanding and acceptance.       Plan Discussed with: CRNA  Anesthesia Plan Comments:         Anesthesia Quick Evaluation

## 2019-05-18 NOTE — Discharge Instructions (Addendum)
Take it easy today and tomorrow. Avoid bending at the waist or lifting over 5 pounds for 2 weeks. Remove Band-Aids on Thursday then okay to shower. Pain medicine as previously directed. Call office if there is a change in your condition.    AMBULATORY SURGERY  DISCHARGE INSTRUCTIONS   1) The drugs that you were given will stay in your system until tomorrow so for the next 24 hours you should not:  A) Drive an automobile B) Make any legal decisions C) Drink any alcoholic beverage   2) You may resume regular meals tomorrow.  Today it is better to start with liquids and gradually work up to solid foods.  You may eat anything you prefer, but it is better to start with liquids, then soup and crackers, and gradually work up to solid foods.   3) Please notify your doctor immediately if you have any unusual bleeding, trouble breathing, redness and pain at the surgery site, drainage, fever, or pain not relieved by medication.    4) Additional Instructions:        Please contact your physician with any problems or Same Day Surgery at [phone], Monday through Friday 6 am to 4 pm, or [place] at [facility] number at [phone].

## 2019-05-18 NOTE — Transfer of Care (Signed)
Immediate Anesthesia Transfer of Care Note  Patient: Angela Daniel  Procedure(s) Performed: T6 KYPHOPLASTY (N/A Back)  Patient Location: PACU  Anesthesia Type:General  Level of Consciousness: drowsy  Airway & Oxygen Therapy: Patient Spontanous Breathing and Patient connected to face mask oxygen  Post-op Assessment: Report given to RN and Post -op Vital signs reviewed and stable  Post vital signs: Reviewed and stable  Last Vitals:  Vitals Value Taken Time  BP 159/78 05/18/19 1457  Temp    Pulse 82 05/18/19 1459  Resp 17 05/18/19 1458  SpO2 96 % 05/18/19 1459  Vitals shown include unvalidated device data.  Last Pain:  Vitals:   05/18/19 1215  TempSrc: Tympanic         Complications: No apparent anesthesia complications

## 2019-05-18 NOTE — H&P (Signed)
Chief Complaint  Patient presents with  . Spine - Pain   Angela Daniel is a 64 y.o. female who presents today for evaluation of T6 compression fracture. 1 week ago she developed nontraumatic midline thoracic back pain just above her bra line that is been severe and debilitating. She has a history of chronic back pain. Has been off oxycodone and only taking buprenorphine. Drug database reviewed showing no recent opioid prescriptions except for ED visit. Patient's midline thoracic back pain is new. Pain is severe. She is unable to get comfortable with sitting lying or standing. She is able to walk with no assistive devices but pain is severe. ER prescribed 5 mg oxycodone with very little relief. No numbness tingling radicular symptoms.  Past Medical History: Past Medical History:  Diagnosis Date  . Arthritis  . Asthma without status asthmaticus, unspecified  . CHF (congestive heart failure) (CMS-HCC)  . COPD (chronic obstructive pulmonary disease) (CMS-HCC)  . Depression  . Diabetes mellitus type 2, uncomplicated (CMS-HCC)  . GERD (gastroesophageal reflux disease)  . Hypertension  . Osteoarthritis  . Osteoporosis  . Sleep apnea   Past Surgical History: Past Surgical History:  Procedure Laterality Date  . APPENDECTOMY  . [place]  x3  . CHOLECYSTECTOMY  . HYSTERECTOMY 1986  . JOINT REPLACEMENT  Left hip replacement   Past Family History: Family History  Problem Relation Age of Onset  . Coronary Artery Disease (Blocked arteries around heart) Other  . Lung cancer Other  Sibling  . Stroke Mother  . Lung cancer Mother  . Diabetes Mother  . Osteoporosis (Thinning of bones) Mother  . Myocardial Infarction (Heart attack) Father  . High blood pressure (Hypertension) Father  . Lung cancer Father  . Asthma Father   Medications: Current Outpatient Medications Ordered in Epic  Medication Sig Dispense Refill  . albuterol 90 mcg/actuation inhaler Inhale 2  inhalations into the lungs every 6 (six) hours as needed for Wheezing.  Marland Kitchen ALPRAZolam (XANAX) 2 MG tablet Take 2 mg by mouth 4 (four) times daily.  . ARIPiprazole (ABILIFY) 10 MG tablet Take 10 mg by mouth daily.  Marland Kitchen esomeprazole (NEXIUM) 40 MG DR capsule Take 40 mg by mouth daily.  Marland Kitchen gabapentin (NEURONTIN) 400 MG capsule Take 400 mg by mouth 3 (three) times daily.  Marland Kitchen oxyCODONE (ROXICODONE) 5 MG immediate release tablet Take 1 tablet (5 mg total) by mouth every 6 (six) hours as needed for Pain for up to 5 days 20 tablet 0  . OXYCODONE HCL (OXYCODONE ORAL) Take 10 mg by mouth 4 (four) times daily.  . pravastatin (PRAVACHOL) 40 MG tablet Take 40 mg by mouth nightly.  Marland Kitchen telmisartan (MICARDIS) 40 MG tablet Take 40 mg by mouth daily.  Marland Kitchen TIOTROPIUM BROMIDE (SPIRIVA WITH HANDIHALER INHAL) Inhale into the lungs.   No current Epic-ordered facility-administered medications on file.   Allergies: No Known Allergies   Review of Systems:  A comprehensive 14 point ROS was performed, reviewed by me today, and the pertinent orthopaedic findings are documented in the HPI.  Exam: BP 138/82  Wt 63.5 kg (140 lb)  LMP 06/11/1980  BMI 24.80 kg/m  General:  Well developed, well nourished, no apparent distress, normal affect, normal gait with no antalgic component.   HEENT: Head normocephalic, atraumatic, PERRL.   Abdomen: Soft, non tender, non distended, Bowel sounds present.  Heart: Examination of the heart reveals regular, rate, and rhythm. There is no murmur noted on ascultation. There is a  normal apical pulse.  Lungs: Lungs are clear to auscultation. There is no wheeze, rhonchi, or crackles. There is normal expansion of bilateral chest walls.   Examination thoracic spine shows patient has severe tenderness with percussion along the T6 spinous process. No paravertebral muscle tenderness. Nontender throughout the lower thoracic spine or lumbar spine along the spinous process. No cervical spinous  process tenderness. She is able to ambulate with a slow antalgic gait. No assistive devices. Hips move well with no discomfort. No weakness in the lower extremities.  AP and lateral views of the thoracic spine are ordered interpreted by me in the office today. Impression: Patient has anterior wedge compression deformity of the superior endplate of T6 with 20% loss of vertebral body height. No other evidence of acute bony abnormality throughout the thoracic spine.  [facility] of the thoracic spine [facility] 05/09/2019 shows progressive superior endplate compression fracture at T6 with at least 40% loss of height. Marrow edema is present throughout the vertebral body. No other evidence of compression fracture. Mild neuroforaminal narrowing at T10-T11 due to facet spurring is worse on the right. Slight disc bulging at T5-T6 without any significant stenosis.  Impression: Closed wedge compression fracture of T6 vertebra, initial encounter (CMS-HCC) [S22.050A] Closed wedge compression fracture of T6 vertebra, initial encounter (CMS-HCC) (primary encounter diagnosis)  Plan:  1. Patient will be set up with DEXA bone scan to evaluate for osteoporosis 2. MRI obtained showing acute T6 compression fracture. Risks, benefits, complications of T6 kyphoplasty have been discussed with patient. Patient has agreed and consented the procedure with Dr. Kennedy Bucker 4. Prescription for oxycodone sent to pharmacy. She will take this sparingly.  This note was generated in part with voice recognition software and I apologize for any typographical errors that were not detected and corrected.  Patience Musca MPA-C    Electronically signed by Patience Musca, PA at 05/10/2019 12:27 PM ED  Reviewed paper H+P, will be scanned into chart. No changes noted. Unrelenting pain so here for kyphoplasty

## 2019-05-20 LAB — SURGICAL PATHOLOGY

## 2019-05-24 NOTE — Anesthesia Postprocedure Evaluation (Signed)
Anesthesia Post Note  Patient: Angela Daniel  Procedure(s) Performed: T6 KYPHOPLASTY (N/A Back)  Patient location during evaluation: PACU Anesthesia Type: MAC Level of consciousness: awake and alert Pain management: pain level controlled Vital Signs Assessment: post-procedure vital signs reviewed and stable Respiratory status: spontaneous breathing, nonlabored ventilation, respiratory function stable and patient connected to nasal cannula oxygen Cardiovascular status: blood pressure returned to baseline and stable Postop Assessment: no apparent nausea or vomiting Anesthetic complications: no     Last Vitals:  Vitals:   05/18/19 1547 05/18/19 1558  BP: (!) 149/80 (!) 107/91  Pulse: 70 69  Resp: 14 14  Temp:  (!) 36.3 C  SpO2: 93% 97%    Last Pain:  Vitals:   05/19/19 0828  TempSrc:   PainSc: 10-Worst pain ever                 Molli Barrows

## 2019-06-22 IMAGING — DX DG KNEE COMPLETE 4+V*L*
4 series · 4 of 4 positions shown · non-contrast
Comparison: None.

CLINICAL DATA: Injury 1 week ago with left knee pain.

EXAM:
LEFT KNEE - COMPLETE 4+ VIEW

[knee ap]
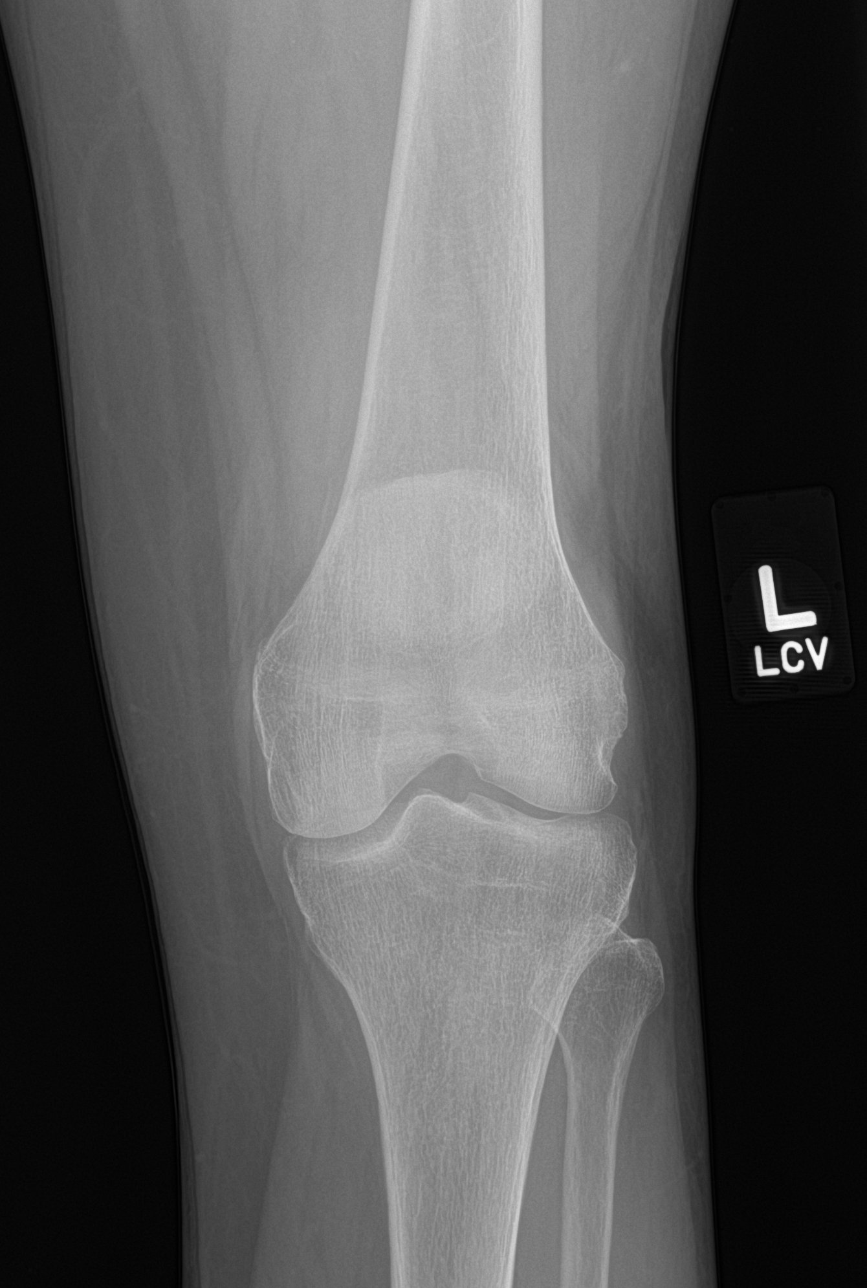

[knee obl (1 of 2)]
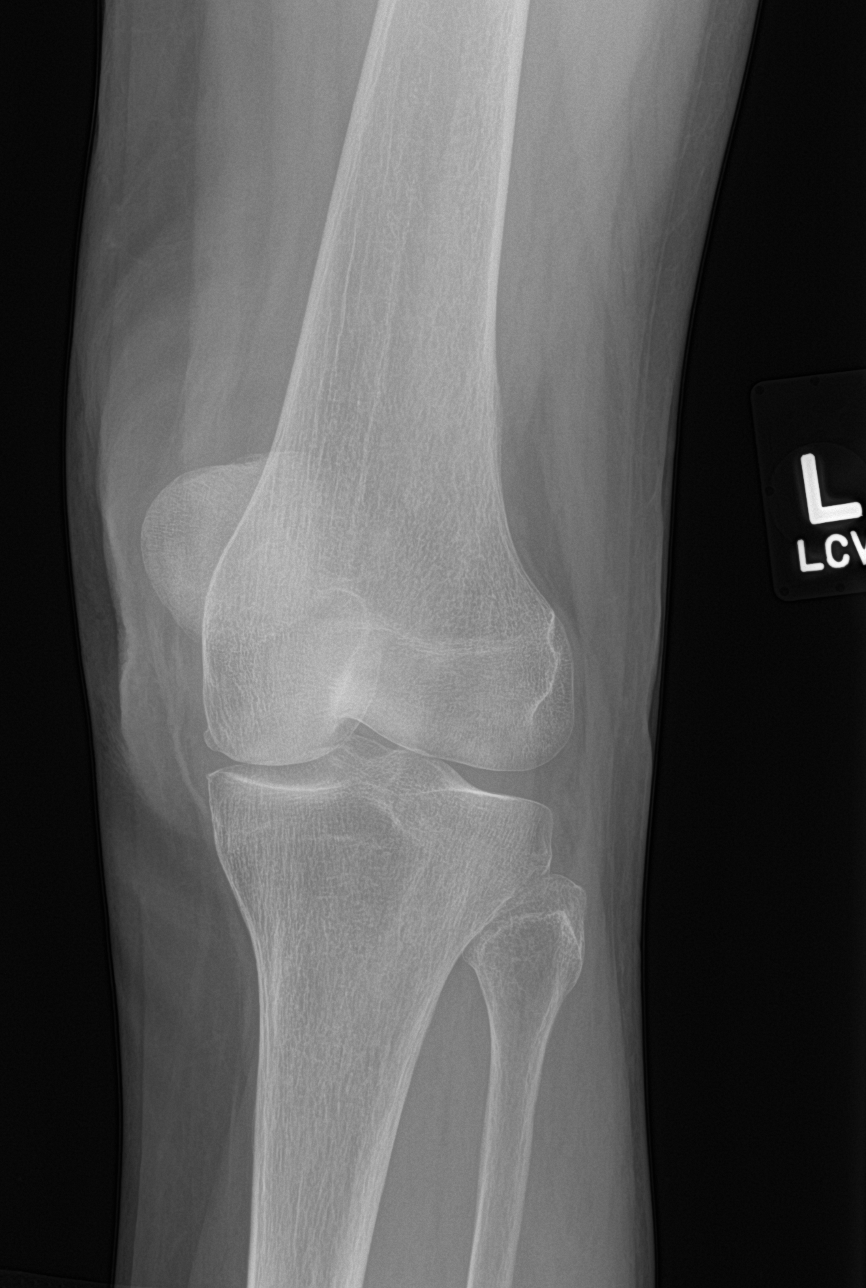

[knee lat]
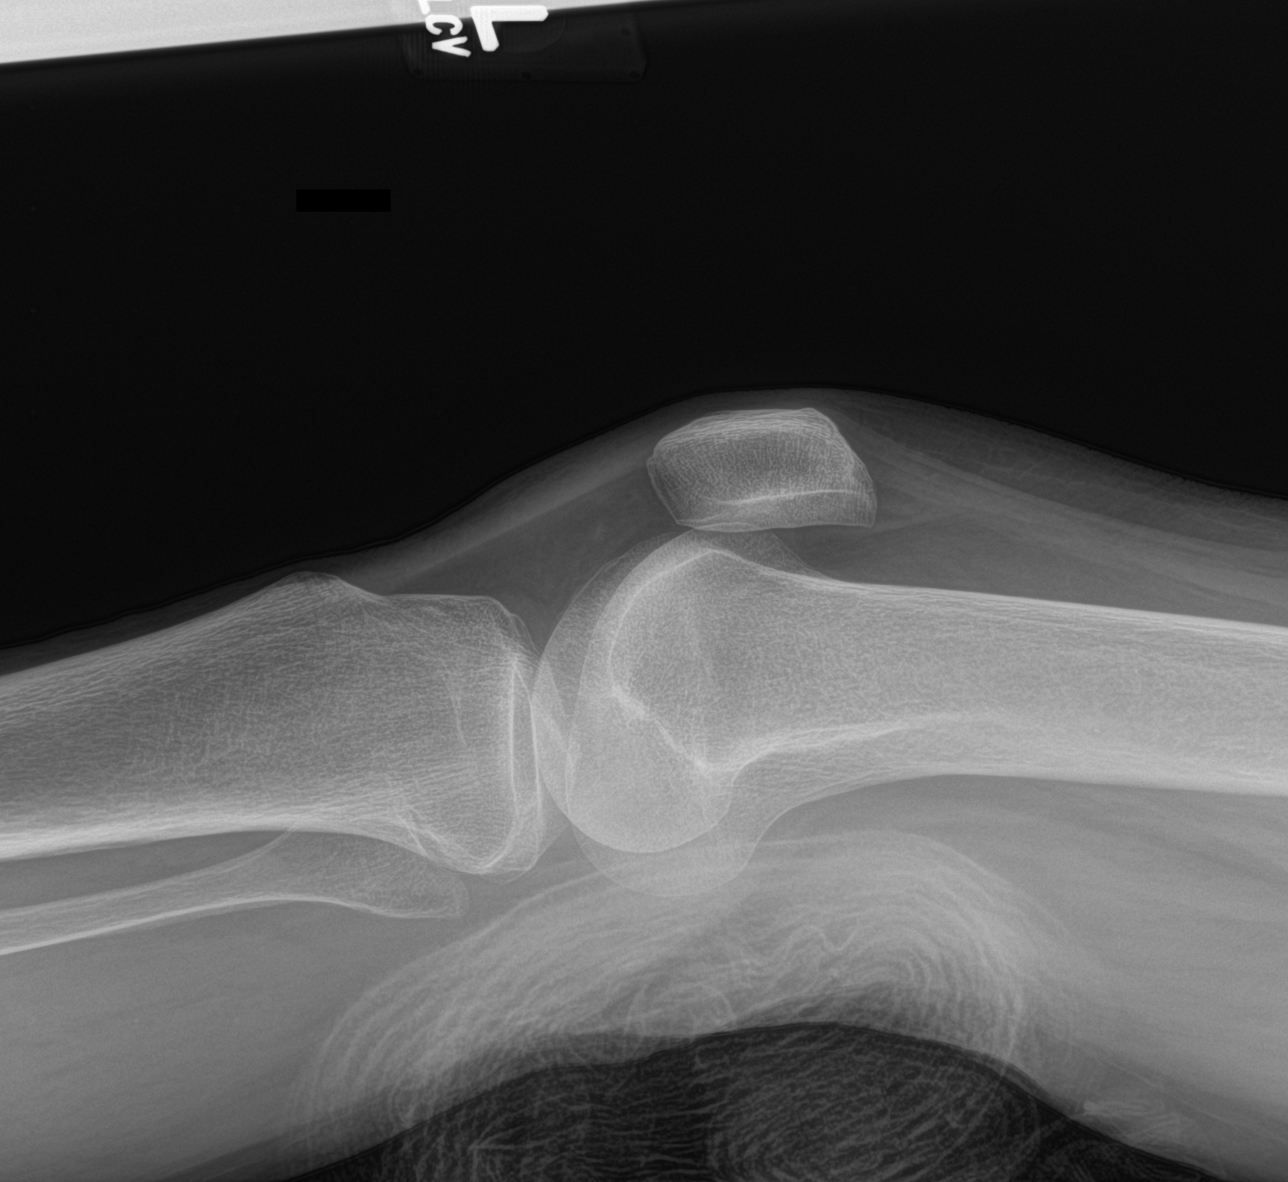

[knee obl (2 of 2)]
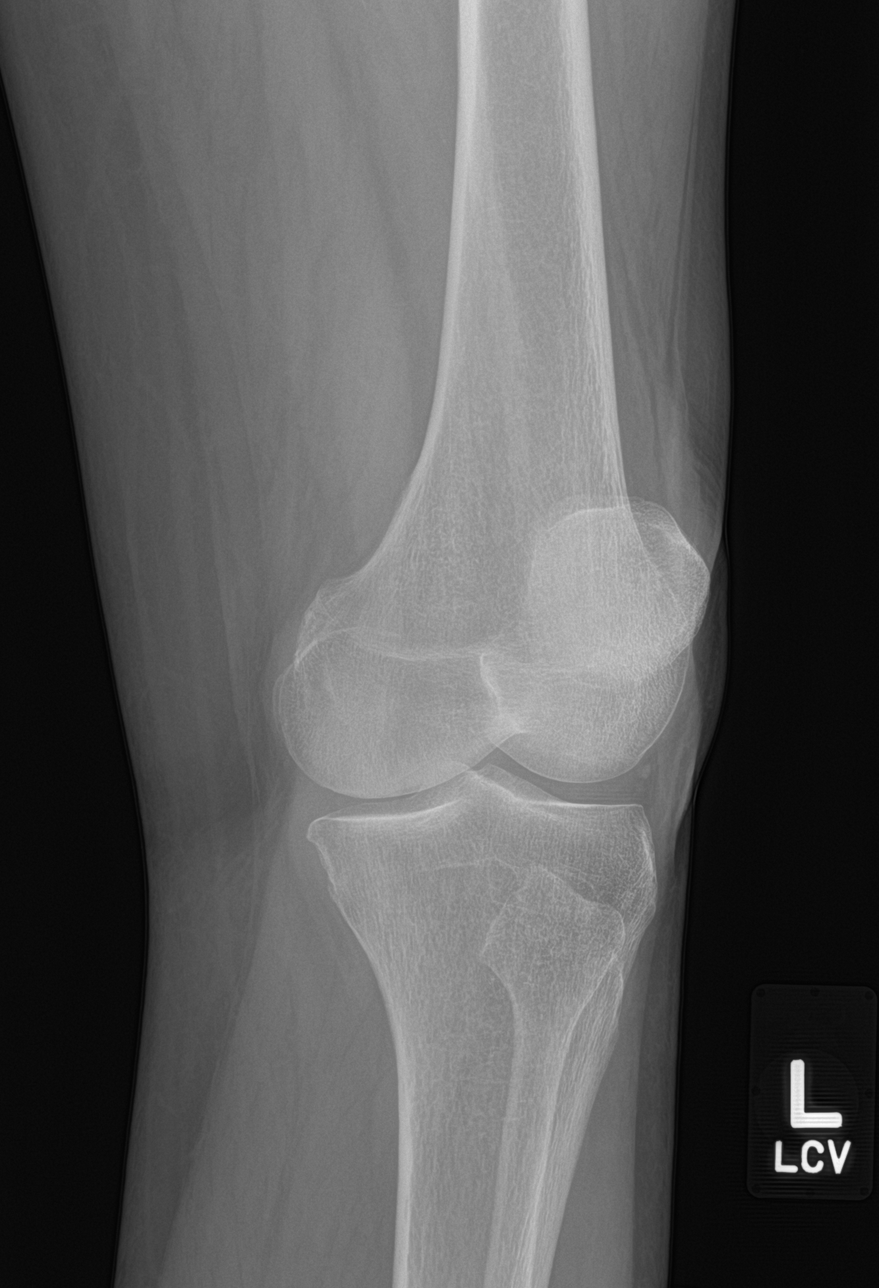

[4 of 4 positions shown; findings below may reference images not displayed]

FINDINGS: No evidence of fracture, dislocation, or joint effusion. No evidence
of arthropathy or other focal bone abnormality. Soft tissues are
unremarkable.
IMPRESSION: Negative.

## 2019-06-25 ENCOUNTER — Emergency Department: Payer: Medicare Other

## 2019-06-25 ENCOUNTER — Emergency Department
Admission: EM | Admit: 2019-06-25 | Discharge: 2019-06-25 | Disposition: A | Discharge status: Home / Self Care | Payer: Medicare Other | Attending: Emergency Medicine | Admitting: Emergency Medicine

## 2019-06-25 ENCOUNTER — Encounter: Payer: Self-pay | Admitting: Emergency Medicine

## 2019-06-25 ENCOUNTER — Other Ambulatory Visit: Payer: Self-pay

## 2019-06-25 DIAGNOSIS — Y93I9 Activity, other involving external motion: Secondary | ICD-10-CM | POA: Diagnosis not present

## 2019-06-25 DIAGNOSIS — E119 Type 2 diabetes mellitus without complications: Secondary | ICD-10-CM | POA: Diagnosis not present

## 2019-06-25 DIAGNOSIS — Z96642 Presence of left artificial hip joint: Secondary | ICD-10-CM | POA: Diagnosis not present

## 2019-06-25 DIAGNOSIS — I509 Heart failure, unspecified: Secondary | ICD-10-CM | POA: Insufficient documentation

## 2019-06-25 DIAGNOSIS — I110 Hypertensive heart disease with heart failure: Secondary | ICD-10-CM | POA: Insufficient documentation

## 2019-06-25 DIAGNOSIS — Z7982 Long term (current) use of aspirin: Secondary | ICD-10-CM | POA: Insufficient documentation

## 2019-06-25 DIAGNOSIS — J449 Chronic obstructive pulmonary disease, unspecified: Secondary | ICD-10-CM | POA: Diagnosis not present

## 2019-06-25 DIAGNOSIS — S161XXA Strain of muscle, fascia and tendon at neck level, initial encounter: Secondary | ICD-10-CM | POA: Insufficient documentation

## 2019-06-25 DIAGNOSIS — Y92410 Unspecified street and highway as the place of occurrence of the external cause: Secondary | ICD-10-CM | POA: Diagnosis not present

## 2019-06-25 DIAGNOSIS — F17210 Nicotine dependence, cigarettes, uncomplicated: Secondary | ICD-10-CM | POA: Diagnosis not present

## 2019-06-25 DIAGNOSIS — R519 Headache, unspecified: Secondary | ICD-10-CM | POA: Diagnosis not present

## 2019-06-25 DIAGNOSIS — Z79899 Other long term (current) drug therapy: Secondary | ICD-10-CM | POA: Insufficient documentation

## 2019-06-25 DIAGNOSIS — Z7984 Long term (current) use of oral hypoglycemic drugs: Secondary | ICD-10-CM | POA: Insufficient documentation

## 2019-06-25 DIAGNOSIS — Y999 Unspecified external cause status: Secondary | ICD-10-CM | POA: Insufficient documentation

## 2019-06-25 DIAGNOSIS — S199XXA Unspecified injury of neck, initial encounter: Secondary | ICD-10-CM | POA: Diagnosis present
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
## 2019-06-25 NOTE — Discharge Instructions (Addendum)
Follow-up with your regular doctor if not improving in 5 to 7 days.  Return emergency department if worsening.  Apply ice to all areas that hurt.  Continue your regular pain medications.

## 2019-07-21 ENCOUNTER — Other Ambulatory Visit (HOSPITAL_COMMUNITY): Payer: Self-pay | Admitting: Orthopedic Surgery

## 2019-07-21 ENCOUNTER — Other Ambulatory Visit: Payer: Self-pay | Admitting: Orthopedic Surgery

## 2019-07-21 DIAGNOSIS — S22000A Wedge compression fracture of unspecified thoracic vertebra, initial encounter for closed fracture: Secondary | ICD-10-CM

## 2019-07-27 ENCOUNTER — Encounter: Payer: Self-pay | Admitting: *Deleted

## 2019-07-27 DIAGNOSIS — E119 Type 2 diabetes mellitus without complications: Secondary | ICD-10-CM | POA: Diagnosis not present

## 2019-07-27 DIAGNOSIS — J449 Chronic obstructive pulmonary disease, unspecified: Secondary | ICD-10-CM | POA: Insufficient documentation

## 2019-07-27 DIAGNOSIS — I110 Hypertensive heart disease with heart failure: Secondary | ICD-10-CM | POA: Insufficient documentation

## 2019-07-27 DIAGNOSIS — F17210 Nicotine dependence, cigarettes, uncomplicated: Secondary | ICD-10-CM | POA: Diagnosis not present

## 2019-07-27 DIAGNOSIS — Z96642 Presence of left artificial hip joint: Secondary | ICD-10-CM | POA: Diagnosis not present

## 2019-07-27 DIAGNOSIS — I509 Heart failure, unspecified: Secondary | ICD-10-CM | POA: Diagnosis not present

## 2019-07-27 DIAGNOSIS — Z79899 Other long term (current) drug therapy: Secondary | ICD-10-CM | POA: Diagnosis not present

## 2019-07-27 DIAGNOSIS — Z7984 Long term (current) use of oral hypoglycemic drugs: Secondary | ICD-10-CM | POA: Diagnosis not present

## 2019-07-27 DIAGNOSIS — R109 Unspecified abdominal pain: Secondary | ICD-10-CM | POA: Diagnosis present

## 2019-07-27 DIAGNOSIS — N200 Calculus of kidney: Secondary | ICD-10-CM | POA: Diagnosis not present

## 2019-07-27 LAB — CBC
HCT: 35.2 % — ABNORMAL LOW (ref 36.0–46.0)
Hemoglobin: 11.6 g/dL — ABNORMAL LOW (ref 12.0–15.0)
MCH: 31.3 pg (ref 26.0–34.0)
MCHC: 33 g/dL (ref 30.0–36.0)
MCV: 94.9 fL (ref 80.0–100.0)
Platelets: 218 10*3/uL (ref 150–400)
RBC: 3.71 MIL/uL — ABNORMAL LOW (ref 3.87–5.11)
RDW: 13.6 % (ref 11.5–15.5)
WBC: 9.8 10*3/uL (ref 4.0–10.5)
nRBC: 0 % (ref 0.0–0.2)

## 2019-07-27 NOTE — ED Triage Notes (Signed)
Pt arrives via [facility], per report, the pt is coming from home, started having lower left sided back pain, radiates down flank and into vaginal area, pain with urination, no blood in urine. 160/92, hr92. Hx of HTN has not taken her medication tonight.

## 2019-07-27 NOTE — ED Triage Notes (Addendum)
Pt to ED reporting pain in lower back and lower abd with dysuria x 1 month. No fevers at home. No vaginal discharge but pt reports pain. Pt reports one episode of vomiting yesterday. PT reports pain became unbearable tonight.

## 2019-07-28 ENCOUNTER — Emergency Department
Admission: EM | Admit: 2019-07-28 | Discharge: 2019-07-28 | Disposition: A | Discharge status: Home / Self Care | Payer: Medicare Other | Attending: Emergency Medicine | Admitting: Emergency Medicine

## 2019-07-28 ENCOUNTER — Emergency Department: Payer: Medicare Other

## 2019-07-28 DIAGNOSIS — N200 Calculus of kidney: Secondary | ICD-10-CM

## 2019-07-28 LAB — URINALYSIS, COMPLETE (UACMP) WITH MICROSCOPIC
Bacteria, UA: NONE SEEN
Bilirubin Urine: NEGATIVE
Glucose, UA: NEGATIVE mg/dL
Ketones, ur: NEGATIVE mg/dL
Leukocytes,Ua: NEGATIVE
Nitrite: NEGATIVE
Protein, ur: NEGATIVE mg/dL
Specific Gravity, Urine: 1.009 (ref 1.005–1.030)
pH: 6 (ref 5.0–8.0)

## 2019-07-28 LAB — COMPREHENSIVE METABOLIC PANEL
ALT: 12 U/L (ref 0–44)
AST: 16 U/L (ref 15–41)
Albumin: 3.5 g/dL (ref 3.5–5.0)
Alkaline Phosphatase: 73 U/L (ref 38–126)
Anion gap: 9 (ref 5–15)
BUN: 17 mg/dL (ref 8–23)
CO2: 24 mmol/L (ref 22–32)
Calcium: 8.8 mg/dL — ABNORMAL LOW (ref 8.9–10.3)
Chloride: 109 mmol/L (ref 98–111)
Creatinine, Ser: 0.86 mg/dL (ref 0.44–1.00)
GFR calc Af Amer: >60 mL/min (ref >60)
GFR calc non Af Amer: >60 mL/min (ref >60)
Glucose, Bld: 154 mg/dL — ABNORMAL HIGH (ref 70–99)
Potassium: 3.6 mmol/L (ref 3.5–5.1)
Sodium: 142 mmol/L (ref 135–145)
Total Bilirubin: 0.4 mg/dL (ref 0.3–1.2)
Total Protein: 6.3 g/dL — ABNORMAL LOW (ref 6.5–8.1)

## 2019-07-28 LAB — LIPASE, BLOOD: Lipase: 19 U/L (ref 11–51)

## 2019-07-28 MED ORDER — ONDANSETRON 4 MG PO TBDP
4.0000 mg | ORAL_TABLET | Freq: Once | ORAL | Status: AC
  Administered 2019-07-28: 4 mg via ORAL
  Filled 2019-07-28: qty 1

## 2019-07-28 MED ORDER — ONDANSETRON 4 MG PO TBDP
4.0000 mg | ORAL_TABLET | Freq: Three times a day (TID) | ORAL | 0 refills | Status: DC | PRN
Start: 2019-07-28 — End: 2020-04-30

## 2019-07-28 MED ORDER — IBUPROFEN 600 MG PO TABS: 600.0000 mg | ORAL_TABLET | Freq: Four times a day (QID) | ORAL | 0 refills | Status: AC | PRN

## 2019-07-28 MED ORDER — OXYCODONE HCL 5 MG PO TABS
5.0000 mg | ORAL_TABLET | Freq: Once | ORAL | Status: AC
  Administered 2019-07-28: 5 mg via ORAL
  Filled 2019-07-28: qty 1

## 2019-07-28 MED ORDER — OXYCODONE HCL 5 MG PO TABS: 5.0000 mg | ORAL_TABLET | Freq: Four times a day (QID) | ORAL | 0 refills | Status: AC | PRN

## 2019-07-28 MED ORDER — TAMSULOSIN HCL 0.4 MG PO CAPS: 0.4000 mg | ORAL_CAPSULE | Freq: Every day | ORAL | 0 refills | Status: AC

## 2019-07-28 MED ORDER — KETOROLAC TROMETHAMINE 30 MG/ML IJ SOLN
30.0000 mg | Freq: Once | INTRAMUSCULAR | Status: AC
  Administered 2019-07-28: 30 mg via INTRAMUSCULAR
  Filled 2019-07-28: qty 1

## 2019-07-28 NOTE — ED Provider Notes (Signed)
[facility] Emergency Department Provider Note  ____________________________________________   First MD Initiated Contact with Patient 07/28/19 [phone]     (approximate)  I have reviewed the triage vital signs and the nursing notes.   HISTORY  Chief Complaint Abdominal Pain, Back Pain, and Dysuria    HPI Angela Daniel is a 64 y.o. female with CHF, COPD, diabetes, fibromyalgia who comes in with abdominal pain, back pain, dysuria.  Patient reports having severe left-sided back pain that started yesterday, constant, nothing made it better including ibuprofen, nothing made it worse.  The pain starts in the left flank and radiates around to the front of her groin.  Denies ever having this pain before.  Denies any nausea or vomiting or fevers.  Denies chest pain, shortness of breath.            Past Medical History:  Diagnosis Date  . Congestive heart failure (HCC)   . COPD (chronic obstructive pulmonary disease) (HCC)   . Degenerative joint disease   . Diabetes mellitus without complication (HCC)   . Fibromyalgia   . GERD (gastroesophageal reflux disease)   . Hypertension   . Scoliosis   . Sleep apnea     Patient Active Problem List   Diagnosis Date Noted  . Acute respiratory failure with hypoxia (HCC) 11/07/2014  . COPD exacerbation (HCC) 11/07/2014  . CAP (community acquired pneumonia) 11/07/2014    Past Surgical History:  Procedure Laterality Date  . ABDOMINAL HYSTERECTOMY    . APPENDECTOMY    . CESAREAN SECTION     x3  . CHOLECYSTECTOMY    . HIP SURGERY Left    joint replacement  . KYPHOPLASTY N/A 05/18/2019   Procedure: T6 KYPHOPLASTY;  Surgeon: Kennedy Bucker, MD;  Location: [facility] ORS;  Service: Orthopedics;  Laterality: N/A;    Prior to Admission medications   Medication Sig Start Date End Date Taking? Authorizing Provider  albuterol (PROVENTIL HFA;VENTOLIN HFA) 108 (90 BASE) MCG/ACT inhaler Inhale 2 puffs into the lungs every 4  (four) hours as needed for wheezing or shortness of breath.     [provider]  alprazolam Prudy Feeler) 2 MG tablet Take 2 mg by mouth 3 (three) times daily.     [provider]  amphetamine-dextroamphetamine (ADDERALL) 20 MG tablet Take 20 mg by mouth daily at 12 noon. 04/26/19   [provider]  amphetamine-dextroamphetamine (ADDERALL) 30 MG tablet Take 30 mg by mouth 2 (two) times daily. Morning and evening    [provider]  ARIPiprazole (ABILIFY) 15 MG tablet Take 15 mg by mouth daily.    [provider]  aspirin EC 81 MG tablet Take 81 mg by mouth daily.    [provider]  buprenorphine (SUBUTEX) 8 MG SUBL SL tablet Place 8 mg under the tongue 3 (three) times daily. 02/04/19   [provider]  Cholecalciferol (VITAMIN D3) 50 MCG (2000 UT) TABS Take 2,000 mg by mouth daily.    [provider]  cyanocobalamin 2000 MCG tablet Take 2,000 mcg by mouth daily.    [provider]  diclofenac (VOLTAREN) 50 MG EC tablet Take 50 mg by mouth 2 (two) times daily.  04/12/19   [provider]  esomeprazole (NEXIUM) 40 MG capsule Take 40 mg by mouth daily. 02/27/19   [provider]  Fluticasone-Salmeterol (ADVAIR) 250-50 MCG/DOSE AEPB Inhale 1 puff into the lungs daily.     [provider]  gabapentin (NEURONTIN) 800 MG tablet Take 800  mg by mouth 2 (two) times daily.     [provider]  metFORMIN (GLUCOPHAGE) 500 MG tablet Take 500 mg by mouth 2 (two) times daily with a meal.     [provider]  metoprolol succinate (TOPROL-XL) 25 MG 24 hr tablet Take 25 mg by mouth daily.    [provider]  naproxen (NAPROSYN) 500 MG tablet Take 1 tablet (500 mg total) by mouth 2 (two) times daily with a meal. 05/04/19   Jene Every, MD  oxyCODONE (OXY IR/ROXICODONE) 5 MG immediate release tablet Take 1 tablet (5 mg total) by mouth 3 (three) times daily as needed for severe pain. Patient  taking differently: Take 5 mg by mouth every 6 (six) hours as needed for severe pain.  05/04/19   Menshew, Charlesetta Ivory, PA-C  pravastatin (PRAVACHOL) 40 MG tablet Take 1 tablet (40 mg total) by mouth daily. 11/09/14   Altamese Dilling, MD  tiotropium (SPIRIVA) 18 MCG inhalation capsule Place 18 mcg into inhaler and inhale daily.    [provider]  tiZANidine (ZANAFLEX) 4 MG tablet Take 4 mg by mouth 3 (three) times daily. 04/22/19   [provider]  vitamin E 200 UNIT capsule Take 200 Units by mouth daily.    [provider]  zinc gluconate 50 MG tablet Take 50 mg by mouth daily.    [provider]    Allergies Tramadol, Tylenol [acetaminophen], and Vicodin [hydrocodone-acetaminophen]  Family History  Problem Relation Age of Onset  . CVA Mother   . Lung cancer Mother   . Diabetes Mother   . Heart attack Father   . Hypertension Father   . Lung cancer Father   . Asthma Father   . Breast cancer Neg Hx     Social History Social History   Tobacco Use  . Smoking status: Current Every Day Smoker    Packs/day: 0.50    Types: Cigarettes  . Smokeless tobacco: Never Used  Vaping Use  . Vaping Use: Never used  Substance Use Topics  . Alcohol use: No  . Drug use: No      Review of Systems Constitutional: No fever/chills Eyes: No visual changes. ENT: No sore throat. Cardiovascular: Denies chest pain. Respiratory: Denies shortness of breath. Gastrointestinal: No abdominal pain.  No nausea, no vomiting.  No diarrhea.  No constipation. Genitourinary: Negative for dysuria. Musculoskeletal: Left flank pain Skin: Negative for rash. Neurological: Negative for headaches, focal weakness or numbness. All other ROS negative ____________________________________________   PHYSICAL EXAM:  VITAL SIGNS: ED Triage Vitals  Enc Vitals Group     BP 07/27/19 2315 140/70     Pulse Rate 07/27/19 2315 81     Resp 07/27/19 2315 16     Temp 07/27/19  2315 99.2 F (37.3 C)     Temp Source 07/27/19 2315 Oral     SpO2 07/27/19 2315 96 %     Weight 07/27/19 2316 145 lb (65.8 kg)     Height 07/27/19 2316 5\' 3"  (1.6 m)     Head Circumference --      Peak Flow --      Pain Score 07/27/19 2315 10     Pain Loc --      Pain Edu? --      Excl. in GC? --     Constitutional: Alert and oriented. Well appearing and in no acute distress. Eyes: Conjunctivae are normal. EOMI. Head: Atraumatic. Nose: No congestion/rhinnorhea. Mouth/Throat: Mucous membranes are moist.  Neck: No stridor. Trachea Midline. FROM Cardiovascular: Normal rate, regular rhythm. Grossly normal heart sounds.  Good peripheral circulation. Respiratory: Normal respiratory effort.  No retractions. Lungs CTAB. Gastrointestinal: Soft and nontender. No distention. No abdominal bruits.  Left flank pain radiating into the groin Musculoskeletal: No lower extremity tenderness nor edema.  No joint effusions. Neurologic:  Normal speech and language. No gross focal neurologic deficits are appreciated.  Skin:  Skin is warm, dry and intact. No rash noted over the flank Psychiatric: Mood and affect are normal. Speech and behavior are normal. GU: Deferred   ____________________________________________   LABS (all labs ordered are listed, but only abnormal results are displayed)  Labs Reviewed  COMPREHENSIVE METABOLIC PANEL - Abnormal; Notable for the following components:      Result Value   Glucose, Bld 154 (*)    Calcium 8.8 (*)    Total Protein 6.3 (*)    All other components within normal limits  CBC - Abnormal; Notable for the following components:   RBC 3.71 (*)    Hemoglobin 11.6 (*)    HCT 35.2 (*)    All other components within normal limits  URINALYSIS, COMPLETE (UACMP) WITH MICROSCOPIC - Abnormal; Notable for the following components:   Color, Urine YELLOW (*)    APPearance CLEAR (*)    Hgb urine dipstick LARGE (*)    All other components within normal limits   LIPASE, BLOOD   ____________________________________________   RADIOLOGY   Official radiology report(s): CT Renal Stone Study  Result Date: 07/28/2019 CLINICAL DATA:  Flank pain, stone disease suspected EXAM: CT ABDOMEN AND PELVIS WITHOUT CONTRAST TECHNIQUE: Multidetector CT imaging of the abdomen and pelvis was performed following the standard protocol without IV contrast. COMPARISON:  CT chest, abdomen and pelvis 05/04/2019 FINDINGS: Lower chest: Chronic scarring and reticular changes in the lung bases are similar to prior with mild airways thickening and bronchitic change. Some dependent atelectasis noted posteriorly. Cardiac size at the upper limits of normal. No pericardial effusion. Hepatobiliary: No focal liver abnormality is seen. Patient is post cholecystectomy. Slight prominence of the biliary tree likely related to reservoir effect. No calcified intraductal gallstones. Pancreas: Unremarkable. No pancreatic ductal dilatation or surrounding inflammatory changes. Spleen: Normal in size without focal abnormality. Adrenals/Urinary Tract: Normal adrenal glands. Mild left hydroureteronephrosis with asymmetric perinephric and periureteral stranding to the level of a 2 mm calculus at the left ureterovesicular junction. No other urolithiasis or right urinary tract dilatation. No visible concerning renal lesions. Urinary bladder is unremarkable accounting for the degree of distention. Stomach/Bowel: Distal esophagus, stomach and duodenal sweep are unremarkable. No small bowel wall thickening or dilatation. No evidence of obstruction. The appendix is surgically absent. No colonic dilatation or wall thickening. Scattered colonic diverticula without focal inflammation to suggest diverticulitis. Vascular/Lymphatic: Atherosclerotic calcifications within the abdominal aorta and branch vessels. No aneurysm or ectasia. No enlarged abdominopelvic lymph nodes. Reproductive: Uterus is surgically absent. No  concerning adnexal lesions. Other: No abdominopelvic free fluid or free gas. No bowel containing hernias. Remote postsurgical changes of the low anterior pelvic wall compatible with prior Pfannenstiel incision. Musculoskeletal: Prior total left hip arthroplasty in expected alignment with a collared femoral stem further secured by cerclage wires. No periprosthetic fracture or lucency. No other acute osseous abnormalities. Multilevel degenerative changes are present in the imaged portions of the spine. Additional degenerative changes in the pelvis and right hip. IMPRESSION: 1. Mild left hydroureteronephrosis with asymmetric perinephric and periureteral stranding to the level of a 2 mm calculus  at the left ureterovesicular junction. 2. Chronic scarring and reticular changes in the lung bases with mild airways thickening and bronchitic change. 3. Colonic diverticulosis without evidence of diverticulitis. 4. Aortic Atherosclerosis (ICD10-I70.0). Electronically Signed   By: Kreg Shropshire M.D.   On: 07/28/2019 03:00    ____________________________________________   PROCEDURES  Procedure(s) performed (including Critical Care):  Procedures   ____________________________________________   INITIAL IMPRESSION / ASSESSMENT AND PLAN / ED COURSE  Angela Daniel was evaluated in Emergency Department on 07/28/2019 for the symptoms described in the history of present illness. She was evaluated in the context of the global COVID-19 pandemic, which necessitated consideration that the patient might be at risk for infection with the SARS-CoV-2 virus that causes COVID-19. Institutional protocols and algorithms that pertain to the evaluation of patients at risk for COVID-19 are in a state of rapid change based on information released by regulatory bodies including the CDC and federal and state organizations. These policies and algorithms were followed during the patient's care in the ED.    Patient is a well-appearing  64 year old slightly hypertensive who comes in with left flank pain radiating to the groin.  Labs were done to evaluate for electrolyte abnormalities, AKI, UTI, CT renal to evaluate for kidney stone.  Low suspicion for appendicitis given no right lower quadrant pain.   To note patient has been in the emergency room for over 9 hours waiting for evaluation.  In triage labs were done that showed no evidence of leukocytosis.  Hemoglobin is around baseline 11.6.  UA does have some hemoglobin in it.  CT scan shows mild left hydroureter with concerns for 2 mm calculus at the left UVJ.  Given her pain is on the left side I suspect that this is the cause of her pain.  Offered IV versus IM/p.o. management.  Currently her pain is moderate.  She would like to proceed with oral medication.  We will give a dose of oxycodone and IM Toradol.  Patient feels comfortable with being discharged home.  We discussed return precautions including fevers, worsening pain, nausea and vomiting.  Will send with a course of oxycodone, ibuprofen, Zofran, Flomax and give urology follow-up.  Patient reports a allergy to hydrocodone and tramadol.  States that she is tolerated oxycodone in the past.  Patient database was reviewed.  No recent fills for oxycodone.  She is on a a benzo and stimulant chronically but at this time given the new's kidney stone I do feel that she needs a short course of oxycodone to help manage her pain.  Discussed the risk with oversedation related to this and to not use it concurrently with the Xanax.  I discussed the provisional nature of ED diagnosis, the treatment so far, the ongoing plan of care, follow up appointments and return precautions with the patient and any family or support people present. They expressed understanding and agreed with the plan, discharged home.   ____________________________________________   FINAL CLINICAL IMPRESSION(S) / ED DIAGNOSES   Final diagnoses:  Kidney stone       MEDICATIONS GIVEN DURING THIS VISIT:  Medications  ketorolac (TORADOL) 30 MG/ML injection 30 mg (30 mg Intramuscular Given 07/28/19 0803)  ondansetron (ZOFRAN-ODT) disintegrating tablet 4 mg (4 mg Oral Given 07/28/19 0803)  oxyCODONE (Oxy IR/ROXICODONE) immediate release tablet 5 mg (5 mg Oral Given 07/28/19 0803)     ED Discharge Orders         Ordered    oxyCODONE (ROXICODONE) 5 MG immediate release  tablet  Every 6 hours PRN     Discontinue  Reprint     07/28/19 0807    ondansetron (ZOFRAN ODT) 4 MG disintegrating tablet  Every 8 hours PRN     Discontinue  Reprint     07/28/19 0807    tamsulosin (FLOMAX) 0.4 MG CAPS capsule  Daily     Discontinue  Reprint     07/28/19 0807    ibuprofen (ADVIL) 600 MG tablet  Every 6 hours PRN     Discontinue  Reprint     07/28/19 [id number]           Note:  This document was prepared using Dragon voice recognition software and may include unintentional dictation errors.   Concha SeFunke, Media Pizzini E, MD 07/28/19 [phone]

## 2019-07-28 NOTE — Discharge Instructions (Addendum)
Take Tylenol 1 g every 8 hours and the ibuprofen with food.  Take the oxycodone for breakthrough pain.  Do not take it with your Xanax as it can cause oversedation.  Do not drive while on this.  Take the Flomax to help dilate your urethra and the Zofran to help with nausea.  Follow-up with urology if you are unable to pass the stone.  Return to ER if you develop fevers, worsening nausea vomiting or any other concerns   1. Mild left hydroureteronephrosis with asymmetric perinephric and  periureteral stranding to the level of a 2 mm calculus at the left  ureterovesicular junction.  2. Chronic scarring and reticular changes in the lung bases with  mild airways thickening and bronchitic change.  3. Colonic diverticulosis without evidence of diverticulitis.  4. Aortic Atherosclerosis (ICD10-I70.0).

## 2019-08-02 NOTE — Progress Notes (Signed)
08/03/2019 1:23 PM   Angela LovelessSara Jean Daniel 06/01/1955 [id number]  Referring provider: Evie LacksPointer, Elizabeth, NP [address] [place] [id number] Chief Complaint  Patient presents with  . Nephrolithiasis    HPI: Angela Daniel is a 64 y.o. female seen today for evaluation and management of nephrolithiasis.   She was seen in the ED on 07/28/2019 for abdominal/back pain, and dysuria. Patient reported having severe left-sided back pain that started 1 day prior, constant, nothing made it better including ibuprofen, nothing made it worse.  The pain started in the left flank and radiated around to the front of her groin.  Denied ever having this pain before.  Denied any nausea or vomiting or fevers.   UA showed blood but minimal concern for infection.  Renal CT showed mild left hydroureteronephrosis with asymmetric perinephric and periureteral stranding to the level of a 2 mm calculus at the left ureterovesicular junction.  She has left lower extremity swelling and pain. She has pain in the back of the left thigh that started this morning.  She denies any trauma.  She does have some discomfort under her left posterior thigh.  She notes some abdominal and back pain. She has some dysuria.  She is on Flomax.  She has no history of kidney stones.   PMH: Past Medical History:  Diagnosis Date  . Congestive heart failure (HCC)   . COPD (chronic obstructive pulmonary disease) (HCC)   . Degenerative joint disease   . Diabetes mellitus without complication (HCC)   . Fibromyalgia   . GERD (gastroesophageal reflux disease)   . Hypertension   . Scoliosis   . Sleep apnea     Surgical History: Past Surgical History:  Procedure Laterality Date  . ABDOMINAL HYSTERECTOMY    . APPENDECTOMY    . CESAREAN SECTION     x3  . CHOLECYSTECTOMY    . HIP SURGERY Left    joint replacement  . KYPHOPLASTY N/A 05/18/2019   Procedure: T6 KYPHOPLASTY;  Surgeon: Kennedy BuckerMenz, Michael, MD;  Location: [facility] ORS;   Service: Orthopedics;  Laterality: N/A;    Home Medications:  Allergies as of 08/03/2019      Reactions   Tramadol Hives   Tylenol [acetaminophen] Nausea And Vomiting   Vicodin [hydrocodone-acetaminophen] Hives      Medication List       Accurate as of August 03, 2019  1:23 PM. If you have any questions, ask your nurse or doctor.        albuterol 108 (90 Base) MCG/ACT inhaler Commonly known as: VENTOLIN HFA Inhale 2 puffs into the lungs every 4 (four) hours as needed for wheezing or shortness of breath.   alprazolam 2 MG tablet Commonly known as: XANAX Take 2 mg by mouth 3 (three) times daily.   amphetamine-dextroamphetamine 30 MG tablet Commonly known as: ADDERALL Take 30 mg by mouth 2 (two) times daily. Morning and evening   amphetamine-dextroamphetamine 20 MG tablet Commonly known as: ADDERALL Take 20 mg by mouth daily at 12 noon.   ARIPiprazole 15 MG tablet Commonly known as: ABILIFY Take 15 mg by mouth daily.   aspirin EC 81 MG tablet Take 81 mg by mouth daily.   buprenorphine 8 MG Subl SL tablet Commonly known as: SUBUTEX Place 8 mg under the tongue 3 (three) times daily.   cyanocobalamin 2000 MCG tablet Take 2,000 mcg by mouth daily.   diclofenac 50 MG EC tablet Commonly known as: VOLTAREN Take 50 mg by mouth 2 (  two) times daily.   esomeprazole 40 MG capsule Commonly known as: NEXIUM Take 40 mg by mouth daily.   Fluticasone-Salmeterol 250-50 MCG/DOSE Aepb Commonly known as: ADVAIR Inhale 1 puff into the lungs daily.   gabapentin 800 MG tablet Commonly known as: NEURONTIN Take 800 mg by mouth 2 (two) times daily.   gabapentin 600 MG tablet Commonly known as: NEURONTIN Take 600 mg by mouth.   ibuprofen 600 MG tablet Commonly known as: ADVIL Take 1 tablet (600 mg total) by mouth every 6 (six) hours as needed for up to 8 days.   ketorolac 10 MG tablet Commonly known as: TORADOL TAKE 1 TABLET (10 MG TOTAL) BY MOUTH EVERY 6 (SIX) HOURS AS NEEDED  FOR UP TO 2 DAYS.   metFORMIN 500 MG tablet Commonly known as: GLUCOPHAGE Take 500 mg by mouth 2 (two) times daily with a meal.   metoprolol succinate 25 MG 24 hr tablet Commonly known as: TOPROL-XL Take 25 mg by mouth daily.   naproxen 500 MG tablet Commonly known as: Naprosyn Take 1 tablet (500 mg total) by mouth 2 (two) times daily with a meal.   ondansetron 4 MG disintegrating tablet Commonly known as: Zofran ODT Take 1 tablet (4 mg total) by mouth every 8 (eight) hours as needed for nausea or vomiting.   pravastatin 40 MG tablet Commonly known as: PRAVACHOL Take 1 tablet (40 mg total) by mouth daily.   tamsulosin 0.4 MG Caps capsule Commonly known as: FLOMAX Take 1 capsule (0.4 mg total) by mouth daily for 14 days.   tiotropium 18 MCG inhalation capsule Commonly known as: SPIRIVA Place 18 mcg into inhaler and inhale daily.   tiZANidine 4 MG tablet Commonly known as: ZANAFLEX Take 4 mg by mouth 3 (three) times daily.   tobramycin-dexamethasone ophthalmic solution Commonly known as: TOBRADEX INSTILL 2 DROPS IN BOTH EYES EVERY 3 HOURS   Vitamin D3 50 MCG (2000 UT) Tabs Take 2,000 mg by mouth daily.   vitamin E 200 UNIT capsule Take 200 Units by mouth daily.   zinc gluconate 50 MG tablet Take 50 mg by mouth daily.       Allergies:  Allergies  Allergen Reactions  . Tramadol Hives  . Tylenol [Acetaminophen] Nausea And Vomiting  . Vicodin [Hydrocodone-Acetaminophen] Hives    Family History: Family History  Problem Relation Age of Onset  . CVA Mother   . Lung cancer Mother   . Diabetes Mother   . Heart attack Father   . Hypertension Father   . Lung cancer Father   . Asthma Father   . Breast cancer Neg Hx     Social History:  reports that she has been smoking cigarettes. She has been smoking about 0.50 packs per day. She has never used smokeless tobacco. She reports that she does not drink alcohol and does not use drugs.   Physical Exam: BP  128/80   Pulse 96   Ht 5\' 3"  (1.6 m)   Wt 152 lb (68.9 kg)   BMI 26.93 kg/m   Constitutional:  Alert and oriented, No acute distress. HEENT: [place] AT, moist mucus membranes.  Trachea midline, no masses. Cardiovascular: No clubbing, cyanosis, or edema. Respiratory: Normal respiratory effort, no increased work of breathing. GI: Abdomen is soft, nontender, nondistended, no abdominal masses Extremities: Marked edema of left shin and foot, asymmetric compared to right.  ' sign negative.  No calf tenderness. Skin: No rashes, bruises or suspicious lesions. Neurologic: Grossly intact, no focal deficits, moving  all 4 extremities. Psychiatric: Normal mood and affect.  Laboratory Data:  Lab Results  Component Value Date   CREATININE 0.86 07/27/2019   Urinalysis RBC 6-10, rare bacteria, otherwise unremarkable.  Pertinent Imaging:  Results for orders placed during the hospital encounter of 07/28/19  CT Renal Stone Study  Narrative CLINICAL DATA:  Flank pain, stone disease suspected  EXAM: CT ABDOMEN AND PELVIS WITHOUT CONTRAST  TECHNIQUE: Multidetector CT imaging of the abdomen and pelvis was performed following the standard protocol without IV contrast.  COMPARISON:  CT chest, abdomen and pelvis 05/04/2019  FINDINGS: Lower chest: Chronic scarring and reticular changes in the lung bases are similar to prior with mild airways thickening and bronchitic change. Some dependent atelectasis noted posteriorly. Cardiac size at the upper limits of normal. No pericardial effusion.  Hepatobiliary: No focal liver abnormality is seen. Patient is post cholecystectomy. Slight prominence of the biliary tree likely related to reservoir effect. No calcified intraductal gallstones.  Pancreas: Unremarkable. No pancreatic ductal dilatation or surrounding inflammatory changes.  Spleen: Normal in size without focal abnormality.  Adrenals/Urinary Tract: Normal adrenal glands. Mild  left hydroureteronephrosis with asymmetric perinephric and periureteral stranding to the level of a 2 mm calculus at the left ureterovesicular junction. No other urolithiasis or right urinary tract dilatation. No visible concerning renal lesions. Urinary bladder is unremarkable accounting for the degree of distention.  Stomach/Bowel: Distal esophagus, stomach and duodenal sweep are unremarkable. No small bowel wall thickening or dilatation. No evidence of obstruction. The appendix is surgically absent. No colonic dilatation or wall thickening. Scattered colonic diverticula without focal inflammation to suggest diverticulitis.  Vascular/Lymphatic: Atherosclerotic calcifications within the abdominal aorta and branch vessels. No aneurysm or ectasia. No enlarged abdominopelvic lymph nodes.  Reproductive: Uterus is surgically absent. No concerning adnexal lesions.  Other: No abdominopelvic free fluid or free gas. No bowel containing hernias. Remote postsurgical changes of the low anterior pelvic wall compatible with prior Pfannenstiel incision.  Musculoskeletal: Prior total left hip arthroplasty in expected alignment with a collared femoral stem further secured by cerclage wires. No periprosthetic fracture or lucency. No other acute osseous abnormalities. Multilevel degenerative changes are present in the imaged portions of the spine. Additional degenerative changes in the pelvis and right hip.  IMPRESSION: 1. Mild left hydroureteronephrosis with asymmetric perinephric and periureteral stranding to the level of a 2 mm calculus at the left ureterovesicular junction. 2. Chronic scarring and reticular changes in the lung bases with mild airways thickening and bronchitic change. 3. Colonic diverticulosis without evidence of diverticulitis. 4. Aortic Atherosclerosis (ICD10-I70.0).   Electronically Signed By: Kreg Shropshire M.D. On: 07/28/2019 03:00   I have personally reviewed the  images and agree with radiologist interpretation.    Results for orders placed or performed in visit on 08/03/19  Microscopic Examination   Urine  Result Value Ref Range   WBC, UA 6-10 (A) 0 - 5 /hpf   RBC 0-2 0 - 2 /hpf   Epithelial Cells (non renal) 0-10 0 - 10 /hpf   Bacteria, UA Many (A) None seen/Few  Urinalysis, Complete  Result Value Ref Range   Specific Gravity, UA 1.025 1.005 - 1.030   pH, UA 6.0 5.0 - 7.5   Color, UA Yellow Yellow   Appearance Ur Cloudy (A) Clear   Leukocytes,UA Negative Negative   Protein,UA Negative Negative/Trace   Glucose, UA Negative Negative   Ketones, UA Negative Negative   RBC, UA Trace (A) Negative   Bilirubin, UA Negative Negative   Urobilinogen,  Ur 1.0 0.2 - 1.0 mg/dL   Nitrite, UA Negative Negative   Microscopic Examination See below:      Assessment & Plan:    1. Left UVJ 2 mm left UVJ stone without infection  No other stones.   Based on size and location of the stone, patient has an excellent chance of spontaneous interval stone passage over 30-day period. Medical expulsive therapy was discussed as an option.  We discussed general stone prevention techniques including drinking plenty water with goal of producing 2.5 L urine daily, increased citric acid intake, avoidance of high oxalate containing foods, and decreased salt intake.  Information about dietary recommendations given today.   Recommended supportive care.  Warning symptoms reviewed, indications for more urgent/emergent evaluation were discussed.  Patient will remain on Flomax and was given a strainer  -Urine culture sent.  2. Lower extremity edema/pain Severe left leg edema and left thigh pain LLE venous doppler ultrasound sent to rule out clot.   Follow up in 2 weeks with Michiel Cowboy, PA-C. With [facility]   [facility] [address] [place], [place] [address] [phone]  I, Theador Hawthorne, am acting as a scribe for  Dr. Vanna Scotland.  I have reviewed the above documentation for accuracy and completeness, and I agree with the above.   Vanna Scotland, MD  ]

## 2019-08-03 ENCOUNTER — Ambulatory Visit (INDEPENDENT_AMBULATORY_CARE_PROVIDER_SITE_OTHER): Payer: Medicare Other | Admitting: Urology

## 2019-08-03 ENCOUNTER — Other Ambulatory Visit: Payer: Self-pay

## 2019-08-03 ENCOUNTER — Telehealth: Payer: Self-pay | Admitting: *Deleted

## 2019-08-03 ENCOUNTER — Ambulatory Visit
Admission: RE | Admit: 2019-08-03 | Discharge: 2019-08-03 | Disposition: A | Discharge status: Home / Self Care | Payer: Medicare Other | Source: Ambulatory Visit | Attending: Urology | Admitting: Urology

## 2019-08-03 VITALS — BP 128/80 | HR 96 | Ht 63.0 in | Wt 152.0 lb

## 2019-08-03 DIAGNOSIS — N200 Calculus of kidney: Secondary | ICD-10-CM | POA: Insufficient documentation

## 2019-08-03 DIAGNOSIS — M7989 Other specified soft tissue disorders: Secondary | ICD-10-CM | POA: Insufficient documentation

## 2019-08-03 LAB — URINALYSIS, COMPLETE
Bilirubin, UA: NEGATIVE
Glucose, UA: NEGATIVE
Ketones, UA: NEGATIVE
Leukocytes,UA: NEGATIVE
Nitrite, UA: NEGATIVE
Protein,UA: NEGATIVE
Specific Gravity, UA: 1.025 (ref 1.005–1.030)
Urobilinogen, Ur: 1 mg/dL (ref 0.2–1.0)
pH, UA: 6 (ref 5.0–7.5)

## 2019-08-03 LAB — MICROSCOPIC EXAMINATION

## 2019-08-03 NOTE — Telephone Encounter (Addendum)
Patient informed, voiced understanding.   ----- Message from Vanna Scotland, MD sent at 08/03/2019  3:04 PM EDT ----- Results are normal.  Please f/u with PCP for leg swelling.    Vanna Scotland, MD

## 2019-08-05 ENCOUNTER — Other Ambulatory Visit: Payer: Self-pay

## 2019-08-05 ENCOUNTER — Ambulatory Visit
Admission: RE | Admit: 2019-08-05 | Discharge: 2019-08-05 | Disposition: A | Discharge status: Home / Self Care | Payer: Medicare Other | Source: Ambulatory Visit | Attending: Orthopedic Surgery | Admitting: Orthopedic Surgery

## 2019-08-05 DIAGNOSIS — S22000A Wedge compression fracture of unspecified thoracic vertebra, initial encounter for closed fracture: Secondary | ICD-10-CM | POA: Insufficient documentation

## 2019-08-06 LAB — CULTURE, URINE COMPREHENSIVE

## 2019-08-12 ENCOUNTER — Ambulatory Visit: Payer: Self-pay | Admitting: Physician Assistant

## 2019-08-12 ENCOUNTER — Telehealth: Payer: Self-pay | Admitting: *Deleted

## 2019-08-12 NOTE — Telephone Encounter (Signed)
Patient called concerned about dysuria and flank pain. She has been taking Flomax daily.  Counseled on staying hydrated.  Denies fevers, nausea, or any other symptoms. Offered appointment for today for evaluation. Patient declined. Scheduled for tomorrow. Aware to seek medical attention if symptoms worsen.

## 2019-08-13 ENCOUNTER — Ambulatory Visit
Admission: RE | Admit: 2019-08-13 | Discharge: 2019-08-13 | Disposition: A | Discharge status: Home / Self Care | Payer: Medicare Other | Source: Ambulatory Visit | Attending: Urology | Admitting: Urology

## 2019-08-13 ENCOUNTER — Other Ambulatory Visit: Payer: Self-pay

## 2019-08-13 ENCOUNTER — Ambulatory Visit (INDEPENDENT_AMBULATORY_CARE_PROVIDER_SITE_OTHER): Payer: Medicare Other | Admitting: Physician Assistant

## 2019-08-13 ENCOUNTER — Ambulatory Visit
Admission: RE | Admit: 2019-08-13 | Discharge: 2019-08-13 | Disposition: A | Discharge status: Home / Self Care | Payer: Medicare Other | Attending: Urology | Admitting: Urology

## 2019-08-13 VITALS — BP 117/73 | HR 79 | Wt 152.0 lb

## 2019-08-13 DIAGNOSIS — N201 Calculus of ureter: Secondary | ICD-10-CM | POA: Diagnosis not present

## 2019-08-13 DIAGNOSIS — N200 Calculus of kidney: Secondary | ICD-10-CM

## 2019-08-13 LAB — URINALYSIS, COMPLETE
Bilirubin, UA: NEGATIVE
Glucose, UA: NEGATIVE
Ketones, UA: NEGATIVE
Leukocytes,UA: NEGATIVE
Nitrite, UA: NEGATIVE
Specific Gravity, UA: 1.025 (ref 1.005–1.030)
Urobilinogen, Ur: 1 mg/dL (ref 0.2–1.0)
pH, UA: 5.5 (ref 5.0–7.5)

## 2019-08-13 LAB — MICROSCOPIC EXAMINATION: RBC, Urine: >30 /hpf — AB (ref 0–2)

## 2019-08-13 MED ORDER — KETOROLAC TROMETHAMINE 60 MG/2ML IM SOLN
30.0000 mg | Freq: Once | INTRAMUSCULAR | Status: AC
  Administered 2019-08-13: 30 mg via INTRAMUSCULAR

## 2019-08-13 MED ORDER — OXYCODONE HCL 5 MG PO TABS: 5.0000 mg | ORAL_TABLET | Freq: Four times a day (QID) | ORAL | 0 refills | Status: AC | PRN

## 2019-08-13 MED ORDER — TAMSULOSIN HCL 0.4 MG PO CAPS: 0.4000 mg | ORAL_CAPSULE | Freq: Every day | ORAL | 0 refills | Status: DC

## 2019-08-13 NOTE — Patient Instructions (Addendum)
1. Restart daily Flomax and increase your fluid intake to flush the stone out. 2. Continue Zofran as needed to manage your nausea and vomiting. 3. Take oxycodone as needed to manage breakthrough pain. STOP BUPRENORPHINE (SUBUTEX) 24 HOURS PRIOR TO TAKING OXYCODONE AND RESTART BUPRENORPHINE (SUBUTEX) AFTER FINISHING OXYCODONE. 4. If you develop pain or vomiting that are not controllable with medications or develop a fever, please proceed to the Emergency Department immediately.

## 2019-08-13 NOTE — Progress Notes (Signed)
08/13/2019 3:00 PM   Angela Daniel February 26, 1955 [id number]  CC: Chief Complaint  Patient presents with  . Nephrolithiasis    HPI: Angela Daniel is a 64 y.o. female with an active acute stone episode associated with a 2 mm left UVJ stone on CT dated 07/28/2019 who presents today for evaluation of possible UTI.  She was seen in clinic most recently by Dr. Apolinar Junes on 08/03/2019, at which point she was counseled to start Flomax, push fluids, and proceed with trial of passage.  Today, she reports a 2-day history of severe bladder pain and dysuria without gross hematuria. She has had nausea and vomiting, most recently 1 day ago, responsive to Zofran. She has run out of Flomax. She denies fevers.  KUB today revealed interval migration of the left UPJ stone into the UVJ vs. bladder.  In-office UA today positive for 3+ blood and trace protein; urine microscopy with 6-10 WBCs/HPF, >30 RBCs/HPF, and moderate bacteria.   PMH: Past Medical History:  Diagnosis Date  . Congestive heart failure (HCC)   . COPD (chronic obstructive pulmonary disease) (HCC)   . Degenerative joint disease   . Diabetes mellitus without complication (HCC)   . Fibromyalgia   . GERD (gastroesophageal reflux disease)   . Hypertension   . Scoliosis   . Sleep apnea     Surgical History: Past Surgical History:  Procedure Laterality Date  . ABDOMINAL HYSTERECTOMY    . APPENDECTOMY    . CESAREAN SECTION     x3  . CHOLECYSTECTOMY    . HIP SURGERY Left    joint replacement  . KYPHOPLASTY N/A 05/18/2019   Procedure: T6 KYPHOPLASTY;  Surgeon: Kennedy Bucker, MD;  Location: [facility] ORS;  Service: Orthopedics;  Laterality: N/A;    Home Medications:  Allergies as of 08/13/2019      Reactions   Tramadol Hives   Tylenol [acetaminophen] Nausea And Vomiting   Vicodin [hydrocodone-acetaminophen] Hives      Medication List       Accurate as of August 13, 2019  3:00 PM. If you have any questions, ask your nurse or doctor.          STOP taking these medications   tobramycin-dexamethasone ophthalmic solution Commonly known as: TOBRADEX Stopped by: Carman Ching, PA-C     TAKE these medications   albuterol 108 (90 Base) MCG/ACT inhaler Commonly known as: VENTOLIN HFA Inhale 2 puffs into the lungs every 4 (four) hours as needed for wheezing or shortness of breath.   alprazolam 2 MG tablet Commonly known as: XANAX Take 2 mg by mouth 3 (three) times daily.   amphetamine-dextroamphetamine 30 MG tablet Commonly known as: ADDERALL Take 30 mg by mouth 2 (two) times daily. Morning and evening   amphetamine-dextroamphetamine 20 MG tablet Commonly known as: ADDERALL Take 20 mg by mouth daily at 12 noon.   ARIPiprazole 15 MG tablet Commonly known as: ABILIFY Take 15 mg by mouth daily.   aspirin EC 81 MG tablet Take 81 mg by mouth daily.   buprenorphine 8 MG Subl SL tablet Commonly known as: SUBUTEX Place 8 mg under the tongue 3 (three) times daily.   cyanocobalamin 2000 MCG tablet Take 2,000 mcg by mouth daily.   diclofenac 50 MG EC tablet Commonly known as: VOLTAREN Take 50 mg by mouth 2 (two) times daily.   esomeprazole 40 MG capsule Commonly known as: NEXIUM Take 40 mg by mouth daily.   Fluticasone-Salmeterol 250-50 MCG/DOSE Aepb Commonly known as: ADVAIR Inhale  1 puff into the lungs daily.   gabapentin 800 MG tablet Commonly known as: NEURONTIN Take 800 mg by mouth 2 (two) times daily.   gabapentin 600 MG tablet Commonly known as: NEURONTIN Take 600 mg by mouth.   ketorolac 10 MG tablet Commonly known as: TORADOL TAKE 1 TABLET (10 MG TOTAL) BY MOUTH EVERY 6 (SIX) HOURS AS NEEDED FOR UP TO 2 DAYS.   metFORMIN 500 MG tablet Commonly known as: GLUCOPHAGE Take 500 mg by mouth 2 (two) times daily with a meal.   metoprolol succinate 25 MG 24 hr tablet Commonly known as: TOPROL-XL Take 25 mg by mouth daily.   naproxen 500 MG tablet Commonly known as: Naprosyn Take 1  tablet (500 mg total) by mouth 2 (two) times daily with a meal.   ondansetron 4 MG disintegrating tablet Commonly known as: Zofran ODT Take 1 tablet (4 mg total) by mouth every 8 (eight) hours as needed for nausea or vomiting.   pravastatin 40 MG tablet Commonly known as: PRAVACHOL Take 1 tablet (40 mg total) by mouth daily.   tamsulosin 0.4 MG Caps capsule Commonly known as: FLOMAX Take 1 capsule (0.4 mg total) by mouth daily. Started by: Carman Ching, PA-C   tiotropium 18 MCG inhalation capsule Commonly known as: SPIRIVA Place 18 mcg into inhaler and inhale daily.   tiZANidine 4 MG tablet Commonly known as: ZANAFLEX Take 4 mg by mouth 3 (three) times daily.   Vitamin D3 50 MCG (2000 UT) Tabs Take 2,000 mg by mouth daily.   vitamin E 200 UNIT capsule Take 200 Units by mouth daily.   zinc gluconate 50 MG tablet Take 50 mg by mouth daily.       Allergies:  Allergies  Allergen Reactions  . Tramadol Hives  . Tylenol [Acetaminophen] Nausea And Vomiting  . Vicodin [Hydrocodone-Acetaminophen] Hives    Family History: Family History  Problem Relation Age of Onset  . CVA Mother   . Lung cancer Mother   . Diabetes Mother   . Heart attack Father   . Hypertension Father   . Lung cancer Father   . Asthma Father   . Breast cancer Neg Hx     Social History:   reports that she has been smoking cigarettes. She has been smoking about 0.50 packs per day. She has never used smokeless tobacco. She reports that she does not drink alcohol and does not use drugs.  Physical Exam: BP 117/73   Pulse 79   Wt 152 lb (68.9 kg)   BMI 26.93 kg/m   Constitutional:  Alert and oriented, no acute distress, nontoxic appearing HEENT: [place], AT Cardiovascular: No clubbing, cyanosis, or edema Respiratory: Normal respiratory effort, no increased work of breathing Skin: No rashes, bruises or suspicious lesions Neurologic: Grossly intact, no focal deficits, moving all 4  extremities Psychiatric: Normal mood and affect  Laboratory Data: Results for orders placed or performed in visit on 08/13/19  CULTURE, URINE COMPREHENSIVE   Specimen: Urine   UR  Result Value Ref Range   Urine Culture, Comprehensive Preliminary report    Organism ID, Bacteria Comment   Microscopic Examination   Urine  Result Value Ref Range   WBC, UA 6-10 (A) 0 - 5 /hpf   RBC >30 (A) 0 - 2 /hpf   Epithelial Cells (non renal) 0-10 0 - 10 /hpf   Mucus, UA Present (A) Not Estab.   Bacteria, UA Moderate (A) None seen/Few  Urinalysis, Complete  Result Value Ref Range  Specific Gravity, UA 1.025 1.005 - 1.030   pH, UA 5.5 5.0 - 7.5   Color, UA Yellow Yellow   Appearance Ur Cloudy (A) Clear   Leukocytes,UA Negative Negative   Protein,UA Trace (A) Negative/Trace   Glucose, UA Negative Negative   Ketones, UA Negative Negative   RBC, UA 3+ (A) Negative   Bilirubin, UA Negative Negative   Urobilinogen, Ur 1.0 0.2 - 1.0 mg/dL   Nitrite, UA Negative Negative   Microscopic Examination See below:    Pertinent Imaging: [facility], 08/14/2019: CLINICAL DATA:  Nephrolithiasis  EXAM: ABDOMEN - 1 VIEW  COMPARISON:  CT 07/28/2019  FINDINGS: The previously noted calculus within the distal left ureter has progressed to the midline and may be within the left ureterovesicular junction or the dependently within the bladder. This measures 3 mm x 5 mm. Multiple additional phleboliths are seen within the pelvis. Tiny vascular calcification is seen within the left paraspinal region at the level of L2. No other renal or ureteral calculi are identified. Cholecystectomy clips in the seen in the right upper quadrant. Normal abdominal gas pattern left total hip arthroplasty has been performed. Surgical clips are seen within the left hemipelvis.  IMPRESSION: 3 mm x 5 mm calculus now approaches the midline pelvis, either at the left ureterovesicular junction or dependent within the bladder. No  additional nephro or urolithiasis identified.   Electronically Signed   By: Helyn Numbers MD   On: 08/14/2019 23:16  I personally reviewed the imaging above and note distal migration of the left ureteral stone.  Assessment & Plan:   1. Left ureteral stone Lower tract symptoms and imaging suggestive of distal migration of the stone with possible imminent passage. UA consistent with stone episode rather than UTI, will send for culture to confirm.  Refilling Flomax. Toradol administered in clinic today. Counseled her to continue Zofran PRN.  I contacted the patient's psychiatry practice (the prescribers of her buprenorphine) to discuss pain management. They confirmed that she does not have a pain contract with them and they do not prescribe pain medication. Per their recommendations, prescribing a short course of oxycodone 5mg  IR for pain control with instructions to stop buprenorphine 24 hours prior to starting oxycodone and restarting buprenorphine thereafter. Patient expressed understanding.  Counseled her to proceed to the emergency room if she develops fever or uncontrollable vomiting or pain.  She expressed understanding. - Urinalysis, Complete - CULTURE, URINE COMPREHENSIVE - tamsulosin (FLOMAX) 0.4 MG CAPS capsule; Take 1 capsule (0.4 mg total) by mouth daily.  Dispense: 30 capsule; Refill: 0 - DG Abd 1 View; Future - ketorolac (TORADOL) injection 30 mg - oxyCODONE (ROXICODONE) 5 MG immediate release tablet; Take 1 tablet (5 mg total) by mouth every 6 (six) hours as needed for up to 3 days for severe pain.  Dispense: 5 tablet; Refill: 0   Return in about 4 weeks (around 09/10/2019) for Stone f/u with KUB prior.  09/12/2019, PA-C  [facility] [address] [place], [place] [phone]

## 2019-08-17 ENCOUNTER — Ambulatory Visit: Payer: Self-pay | Admitting: Physician Assistant

## 2019-08-17 LAB — CULTURE, URINE COMPREHENSIVE

## 2019-09-04 ENCOUNTER — Other Ambulatory Visit: Payer: Self-pay | Admitting: Physician Assistant

## 2019-09-04 DIAGNOSIS — N201 Calculus of ureter: Secondary | ICD-10-CM

## 2019-09-09 ENCOUNTER — Ambulatory Visit: Payer: Self-pay | Admitting: Physician Assistant

## 2019-09-09 ENCOUNTER — Encounter: Payer: Self-pay | Admitting: Physician Assistant

## 2019-09-28 ENCOUNTER — Other Ambulatory Visit: Payer: Self-pay | Admitting: Urology

## 2019-09-28 DIAGNOSIS — N201 Calculus of ureter: Secondary | ICD-10-CM

## 2019-10-15 ENCOUNTER — Ambulatory Visit (INDEPENDENT_AMBULATORY_CARE_PROVIDER_SITE_OTHER): Payer: Medicare Other | Admitting: Physician Assistant

## 2019-10-15 ENCOUNTER — Encounter: Payer: Self-pay | Admitting: Physician Assistant

## 2019-10-15 ENCOUNTER — Ambulatory Visit
Admission: RE | Admit: 2019-10-15 | Discharge: 2019-10-15 | Disposition: A | Discharge status: Home / Self Care | Payer: Medicare Other | Source: Ambulatory Visit | Attending: Physician Assistant | Admitting: Physician Assistant

## 2019-10-15 ENCOUNTER — Other Ambulatory Visit: Payer: Self-pay

## 2019-10-15 ENCOUNTER — Ambulatory Visit
Admission: RE | Admit: 2019-10-15 | Discharge: 2019-10-15 | Disposition: A | Discharge status: Home / Self Care | Payer: Medicare Other | Attending: Physician Assistant | Admitting: Physician Assistant

## 2019-10-15 VITALS — BP 154/84 | HR 63 | Ht 63.0 in | Wt 156.8 lb

## 2019-10-15 DIAGNOSIS — R3915 Urgency of urination: Secondary | ICD-10-CM

## 2019-10-15 DIAGNOSIS — N201 Calculus of ureter: Secondary | ICD-10-CM

## 2019-10-15 LAB — URINALYSIS, COMPLETE
Bilirubin, UA: NEGATIVE
Glucose, UA: NEGATIVE
Ketones, UA: NEGATIVE
Leukocytes,UA: NEGATIVE
Nitrite, UA: NEGATIVE
Protein,UA: NEGATIVE
Specific Gravity, UA: 1.025 (ref 1.005–1.030)
Urobilinogen, Ur: 0.2 mg/dL (ref 0.2–1.0)
pH, UA: 5.5 (ref 5.0–7.5)

## 2019-10-15 LAB — MICROSCOPIC EXAMINATION

## 2019-10-15 MED ORDER — SULFAMETHOXAZOLE-TRIMETHOPRIM 800-160 MG PO TABS: 1.0000 | ORAL_TABLET | Freq: Two times a day (BID) | ORAL | 0 refills | Status: AC

## 2019-10-15 NOTE — Progress Notes (Signed)
10/15/2019 9:42 AM   Angela Daniel 03-10-1955 [id number]  CC: Chief Complaint  Patient presents with  . Nephrolithiasis    HPI: Angela Daniel is a 64 y.o. female with a recent history of acute stone episode associated with a 2 mm left UVJ stone managed with trial of passage on Flomax who presents today for follow-up.  I saw her in clinic most recently on 08/13/2019, at which point she reported a 2-day history of severe bladder pain and dysuria without gross hematuria.  KUB at that point revealed migration of the stone into the UVJ versus bladder.  I treated her with Toradol and oxycodone and counseled her to continue Flomax with expected imminent stone passage.  Today she reports resolution of dysuria and bladder pain.  She did not see a stone pass.  She does report frequency x6-8, nocturia x3-4, urgency, and urge incontinence that has persisted and was not typical for her prior to her recent stone episode.  She denies stress incontinence.  KUB today reveals interval clearance of the left UVJ versus bladder stone.  Stable phleboliths also noted.  Notably, CT stone study dated 07/28/2019 with no visible renal lesions.  In-office UA today positive for 1+ blood; urine microscopy with 3-10 RBCs/HPF and many bacteria.  PMH: Past Medical History:  Diagnosis Date  . Congestive heart failure ([employer])   . COPD (chronic obstructive pulmonary disease) ([place])   . Degenerative joint disease   . Diabetes mellitus without complication ([place])   . Fibromyalgia   . GERD (gastroesophageal reflux disease)   . Hypertension   . Scoliosis   . Sleep apnea     Surgical History: Past Surgical History:  Procedure Laterality Date  . ABDOMINAL HYSTERECTOMY    . APPENDECTOMY    . CESAREAN SECTION     x3  . CHOLECYSTECTOMY    . HIP SURGERY Left    joint replacement  . KYPHOPLASTY N/A 05/18/2019   Procedure: T6 KYPHOPLASTY;  Surgeon: Hessie Knows, MD;  Location: [facility] ORS;  Service: Orthopedics;   Laterality: N/A;    Home Medications:  Allergies as of 10/15/2019      Reactions   Tramadol Hives   Tylenol [acetaminophen] Nausea And Vomiting   Vicodin [hydrocodone-acetaminophen] Hives      Medication List       Accurate as of October 15, 2019  9:42 AM. If you have any questions, ask your nurse or doctor.        STOP taking these medications   tamsulosin 0.4 MG Caps capsule Commonly known as: FLOMAX Stopped by: Debroah Loop, PA-C     TAKE these medications   albuterol 108 (90 Base) MCG/ACT inhaler Commonly known as: VENTOLIN HFA Inhale 2 puffs into the lungs every 4 (four) hours as needed for wheezing or shortness of breath.   alprazolam 2 MG tablet Commonly known as: XANAX Take 2 mg by mouth 3 (three) times daily.   amphetamine-dextroamphetamine 30 MG tablet Commonly known as: ADDERALL Take 30 mg by mouth 2 (two) times daily. Morning and evening   amphetamine-dextroamphetamine 20 MG tablet Commonly known as: ADDERALL Take 20 mg by mouth daily at 12 noon.   ARIPiprazole 15 MG tablet Commonly known as: ABILIFY Take 15 mg by mouth daily.   aspirin EC 81 MG tablet Take 81 mg by mouth daily.   atorvastatin 80 MG tablet Commonly known as: LIPITOR Take 80 mg by mouth daily.   buprenorphine 8 MG Subl SL tablet Commonly known as: SUBUTEX  Place 8 mg under the tongue 3 (three) times daily.   cyanocobalamin 2000 MCG tablet Take 2,000 mcg by mouth daily.   diclofenac 50 MG EC tablet Commonly known as: VOLTAREN Take 50 mg by mouth 2 (two) times daily.   esomeprazole 40 MG capsule Commonly known as: NEXIUM Take 40 mg by mouth daily.   Fluticasone-Salmeterol 250-50 MCG/DOSE Aepb Commonly known as: ADVAIR Inhale 1 puff into the lungs daily.   gabapentin 800 MG tablet Commonly known as: NEURONTIN Take 800 mg by mouth 2 (two) times daily.   gabapentin 600 MG tablet Commonly known as: NEURONTIN Take 600 mg by mouth.   ketorolac 10 MG  tablet Commonly known as: TORADOL TAKE 1 TABLET (10 MG TOTAL) BY MOUTH EVERY 6 (SIX) HOURS AS NEEDED FOR UP TO 2 DAYS.   lisinopril 5 MG tablet Commonly known as: ZESTRIL Take 5 mg by mouth daily.   metFORMIN 500 MG tablet Commonly known as: GLUCOPHAGE Take 500 mg by mouth 2 (two) times daily with a meal.   metoprolol succinate 25 MG 24 hr tablet Commonly known as: TOPROL-XL Take 25 mg by mouth daily.   naproxen 500 MG tablet Commonly known as: Naprosyn Take 1 tablet (500 mg total) by mouth 2 (two) times daily with a meal.   Narcan 4 MG/0.1ML Liqd nasal spray kit Generic drug: naloxone SMARTSIG:Both Nares   ondansetron 4 MG disintegrating tablet Commonly known as: Zofran ODT Take 1 tablet (4 mg total) by mouth every 8 (eight) hours as needed for nausea or vomiting.   pantoprazole 20 MG tablet Commonly known as: PROTONIX TAKE ONE TABLET TWICE DAILY FOR TWO WEEKS, THEN ONE TABLET DAILY   pravastatin 40 MG tablet Commonly known as: PRAVACHOL Take 1 tablet (40 mg total) by mouth daily.   rosuvastatin 20 MG tablet Commonly known as: CRESTOR Take 20 mg by mouth daily.   sulfamethoxazole-trimethoprim 800-160 MG tablet Commonly known as: BACTRIM DS Take 1 tablet by mouth 2 (two) times daily for 5 days. Started by: Debroah Loop, PA-C   tiotropium 18 MCG inhalation capsule Commonly known as: SPIRIVA Place 18 mcg into inhaler and inhale daily.   tiZANidine 4 MG tablet Commonly known as: ZANAFLEX Take 4 mg by mouth 3 (three) times daily.   Vitamin D3 50 MCG (2000 UT) Tabs Take 2,000 mg by mouth daily.   vitamin E 200 UNIT capsule Take 200 Units by mouth daily.   zinc gluconate 50 MG tablet Take 50 mg by mouth daily.       Allergies:  Allergies  Allergen Reactions  . Tramadol Hives  . Tylenol [Acetaminophen] Nausea And Vomiting  . Vicodin [Hydrocodone-Acetaminophen] Hives    Family History: Family History  Problem Relation Age of Onset  . CVA  Mother   . Lung cancer Mother   . Diabetes Mother   . Heart attack Father   . Hypertension Father   . Lung cancer Father   . Asthma Father   . Breast cancer Neg Hx     Social History:   reports that she has been smoking cigarettes. She has been smoking about 0.50 packs per day. She has never used smokeless tobacco. She reports that she does not drink alcohol and does not use drugs.  Physical Exam: BP (!) 154/84 (BP Location: Left Arm, Patient Position: Sitting, Cuff Size: Normal)   Pulse 63   Ht 5' 3"  (1.6 m)   Wt 156 lb 12.8 oz (71.1 kg)   BMI 27.78 kg/m  Constitutional:  Alert and oriented, no acute distress, nontoxic appearing HEENT: [place], AT Cardiovascular: No clubbing, cyanosis, or edema Respiratory: Normal respiratory effort, no increased work of breathing Skin: No rashes, bruises or suspicious lesions Neurologic: Grossly intact, no focal deficits, moving all 4 extremities Psychiatric: Normal mood and affect  Laboratory Data: Results for orders placed or performed in visit on 10/15/19  CULTURE, URINE COMPREHENSIVE   Specimen: Urine   UR  Result Value Ref Range   Urine Culture, Comprehensive Preliminary report (A)    Organism ID, Bacteria Gram negative rods (A)   Microscopic Examination   Urine  Result Value Ref Range   WBC, UA 0-5 0 - 5 /hpf   RBC 3-10 (A) 0 - 2 /hpf   Epithelial Cells (non renal) 0-10 0 - 10 /hpf   Casts Present None seen /lpf   Cast Type Hyaline casts N/A   Mucus, UA Present (A) Not Estab.   Bacteria, UA Many (A) None seen/Few  Urinalysis, Complete  Result Value Ref Range   Specific Gravity, UA 1.025 1.005 - 1.030   pH, UA 5.5 5.0 - 7.5   Color, UA Yellow Yellow   Appearance Ur Cloudy (A) Clear   Leukocytes,UA Negative Negative   Protein,UA Negative Negative/Trace   Glucose, UA Negative Negative   Ketones, UA Negative Negative   RBC, UA 1+ (A) Negative   Bilirubin, UA Negative Negative   Urobilinogen, Ur 0.2 0.2 - 1.0 mg/dL   Nitrite,  UA Negative Negative   Microscopic Examination See below:    Pertinent Imaging: KUB, 10/15/2019: CLINICAL DATA:  Kidney stones.  History of LEFT kidney stone.  EXAM: ABDOMEN - 1 VIEW  COMPARISON:  CT of the abdomen and pelvis on 07/28/2019  FINDINGS: Surgical clips are seen in the RIGHT UPPER QUADRANT the abdomen. Bowel gas pattern is nonobstructed. No intrarenal or ureteral stones are identified. There is mild degenerative change in the lumbar spine. Remote LEFT hip arthroplasty. Scattered phleboliths are identified within the pelvis. Vascular calcification is identified just to LEFT of the L2 vertebral body, confirmed on CT.  IMPRESSION: No evidence for acute abnormality.  No intrarenal or ureteral stones identified.   Electronically Signed   By: Nolon Nations M.D.   On: 10/15/2019 15:06  I personally reviewed the images referenced above and note interval clearance of the left UVJ versus bladder stone.  Assessment & Plan:   1. Left ureteral stone KUB with interval clearance of the stone and patient reports resolution of severe pain and dysuria.  No further intervention indicated.  2. Urinary urgency Patient reports ongoing urgency, frequency, and urge incontinence that began with a recent stone episode.  UA today notable for microscopic hematuria and bacteriuria.  We will treat empirically with Bactrim DS twice daily x5 days and send for culture for further evaluation with plans for repeat UA in 1 week after completion of culture appropriate antibiotics.  If microscopic hematuria persists, recommend cystoscopy for further evaluation with reassuring recent noncontrast CT.  If symptoms persist despite antibiotics, she may benefit from a brief course of anticholinergic medications for management of bladder overactivity likely secondary to inflammation in the wake of a recent stone episode. - Urinalysis, Complete - CULTURE, URINE COMPREHENSIVE -  sulfamethoxazole-trimethoprim (BACTRIM DS) 800-160 MG tablet; Take 1 tablet by mouth 2 (two) times daily for 5 days.  Dispense: 10 tablet; Refill: 0   Return in about 1 week (around 10/22/2019) for Lab visit for UA.  Debroah Loop, PA-C  [facility] [address], [place], [place] [address] [phone]

## 2019-10-15 NOTE — Patient Instructions (Addendum)
STOP Flomax (tamsulosin). START Bactrim (sulfamethoxazole-trimethoprim).  I will send your urine for culture today to confirm a urinary tract infection that I believe is causing your symptoms of urinary urgency, frequency, and leakage.  I want you to provide another urine sample in 1 week after you've completed antibiotics to make sure the blood in your urine is gone.

## 2019-10-19 LAB — CULTURE, URINE COMPREHENSIVE

## 2019-10-22 ENCOUNTER — Other Ambulatory Visit: Payer: Self-pay

## 2019-10-22 ENCOUNTER — Ambulatory Visit: Payer: Medicare Other

## 2019-10-22 ENCOUNTER — Telehealth: Payer: Self-pay | Admitting: Physician Assistant

## 2019-10-22 DIAGNOSIS — N201 Calculus of ureter: Secondary | ICD-10-CM

## 2019-10-22 NOTE — Telephone Encounter (Signed)
I just spoke with the patient via telephone.  I reported that her repeat UA from today showed resolution of microscopic hematuria after completion of culture appropriate antibiotics for her Klebsiella UTI.  No further intervention indicated.  Patient reports nausea, vomiting, and abdominal pain over the past day.  Given her recent negative KUB and benign UA today, I do not suspect her symptoms are urologic in nature.  I encouraged her to follow-up with her PCP for further evaluation of nonurologic causes for her symptoms.  She expressed understanding.

## 2019-10-23 LAB — URINALYSIS, COMPLETE
Bilirubin, UA: NEGATIVE
Glucose, UA: NEGATIVE
Ketones, UA: NEGATIVE
Leukocytes,UA: NEGATIVE
Nitrite, UA: NEGATIVE
Protein,UA: NEGATIVE
Specific Gravity, UA: 1.02 (ref 1.005–1.030)
Urobilinogen, Ur: 0.2 mg/dL (ref 0.2–1.0)
pH, UA: 7 (ref 5.0–7.5)

## 2019-10-23 LAB — MICROSCOPIC EXAMINATION

## 2019-12-29 ENCOUNTER — Other Ambulatory Visit: Payer: Self-pay | Admitting: Orthopedic Surgery

## 2019-12-29 ENCOUNTER — Other Ambulatory Visit (HOSPITAL_COMMUNITY): Payer: Self-pay | Admitting: Orthopedic Surgery

## 2019-12-29 DIAGNOSIS — M50122 Cervical disc disorder at C5-C6 level with radiculopathy: Secondary | ICD-10-CM

## 2020-01-08 ENCOUNTER — Ambulatory Visit
Admission: RE | Admit: 2020-01-08 | Discharge: 2020-01-08 | Disposition: A | Discharge status: Home / Self Care | Payer: Medicare Other | Source: Ambulatory Visit | Attending: Orthopedic Surgery | Admitting: Orthopedic Surgery

## 2020-01-08 ENCOUNTER — Ambulatory Visit: Admission: RE | Admit: 2020-01-08 | Payer: Medicare Other | Source: Ambulatory Visit

## 2020-01-08 ENCOUNTER — Other Ambulatory Visit: Payer: Self-pay

## 2020-01-08 DIAGNOSIS — M50122 Cervical disc disorder at C5-C6 level with radiculopathy: Secondary | ICD-10-CM | POA: Diagnosis present

## 2020-02-02 ENCOUNTER — Other Ambulatory Visit: Payer: Self-pay | Admitting: Physical Medicine & Rehabilitation

## 2020-02-02 DIAGNOSIS — M5412 Radiculopathy, cervical region: Secondary | ICD-10-CM

## 2020-02-15 ENCOUNTER — Ambulatory Visit
Admission: RE | Admit: 2020-02-15 | Discharge: 2020-02-15 | Disposition: A | Discharge status: Home / Self Care | Payer: Medicare Other | Source: Ambulatory Visit | Attending: Physical Medicine & Rehabilitation | Admitting: Physical Medicine & Rehabilitation

## 2020-02-15 ENCOUNTER — Other Ambulatory Visit: Payer: Self-pay

## 2020-02-15 DIAGNOSIS — M5412 Radiculopathy, cervical region: Secondary | ICD-10-CM

## 2020-02-15 MED ORDER — IOPAMIDOL (ISOVUE-M 200) INJECTION 41%: 1.0000 mL | Freq: Once | INTRAMUSCULAR | Status: DC

## 2020-02-15 MED ORDER — METHYLPREDNISOLONE ACETATE 40 MG/ML INJ SUSP (RADIOLOG: 120.0000 mg | Freq: Once | INTRAMUSCULAR | Status: DC

## 2020-02-15 MED ORDER — TRIAMCINOLONE ACETONIDE 40 MG/ML IJ SUSP (RADIOLOGY)
60.0000 mg | Freq: Once | INTRAMUSCULAR | Status: AC
  Administered 2020-02-15: 60 mg via EPIDURAL

## 2020-02-15 MED ORDER — IOPAMIDOL (ISOVUE-M 300) INJECTION 61%
1.0000 mL | Freq: Once | INTRAMUSCULAR | Status: AC
  Administered 2020-02-15: 1 mL via EPIDURAL

## 2020-02-15 NOTE — Discharge Instructions (Signed)

## 2020-03-16 ENCOUNTER — Emergency Department: Payer: Medicare Other

## 2020-03-16 ENCOUNTER — Encounter: Payer: Self-pay | Admitting: Emergency Medicine

## 2020-03-16 ENCOUNTER — Other Ambulatory Visit: Payer: Self-pay

## 2020-03-16 ENCOUNTER — Emergency Department
Admission: EM | Admit: 2020-03-16 | Discharge: 2020-03-16 | Disposition: A | Discharge status: Home / Self Care | Payer: Medicare Other | Attending: Emergency Medicine | Admitting: Emergency Medicine

## 2020-03-16 DIAGNOSIS — S199XXA Unspecified injury of neck, initial encounter: Secondary | ICD-10-CM | POA: Diagnosis present

## 2020-03-16 DIAGNOSIS — S0012XA Contusion of left eyelid and periocular area, initial encounter: Secondary | ICD-10-CM | POA: Diagnosis not present

## 2020-03-16 DIAGNOSIS — Y999 Unspecified external cause status: Secondary | ICD-10-CM | POA: Insufficient documentation

## 2020-03-16 DIAGNOSIS — Z7984 Long term (current) use of oral hypoglycemic drugs: Secondary | ICD-10-CM | POA: Insufficient documentation

## 2020-03-16 DIAGNOSIS — E119 Type 2 diabetes mellitus without complications: Secondary | ICD-10-CM | POA: Insufficient documentation

## 2020-03-16 DIAGNOSIS — F17210 Nicotine dependence, cigarettes, uncomplicated: Secondary | ICD-10-CM | POA: Insufficient documentation

## 2020-03-16 DIAGNOSIS — S0990XA Unspecified injury of head, initial encounter: Secondary | ICD-10-CM | POA: Diagnosis not present

## 2020-03-16 DIAGNOSIS — Y92410 Unspecified street and highway as the place of occurrence of the external cause: Secondary | ICD-10-CM | POA: Diagnosis not present

## 2020-03-16 DIAGNOSIS — M25511 Pain in right shoulder: Secondary | ICD-10-CM | POA: Insufficient documentation

## 2020-03-16 DIAGNOSIS — I10 Essential (primary) hypertension: Secondary | ICD-10-CM | POA: Diagnosis not present

## 2020-03-16 DIAGNOSIS — J449 Chronic obstructive pulmonary disease, unspecified: Secondary | ICD-10-CM | POA: Diagnosis not present

## 2020-03-16 DIAGNOSIS — Z79899 Other long term (current) drug therapy: Secondary | ICD-10-CM | POA: Insufficient documentation

## 2020-03-16 DIAGNOSIS — Y9389 Activity, other specified: Secondary | ICD-10-CM | POA: Insufficient documentation

## 2020-03-16 DIAGNOSIS — S0083XA Contusion of other part of head, initial encounter: Secondary | ICD-10-CM

## 2020-03-16 DIAGNOSIS — S161XXA Strain of muscle, fascia and tendon at neck level, initial encounter: Secondary | ICD-10-CM | POA: Diagnosis not present

## 2020-03-16 MED ORDER — METHOCARBAMOL 500 MG PO TABS: 500.0000 mg | ORAL_TABLET | Freq: Four times a day (QID) | ORAL | 0 refills | Status: DC

## 2020-03-16 MED ORDER — MELOXICAM 7.5 MG PO TABS
15.0000 mg | ORAL_TABLET | Freq: Once | ORAL | Status: AC
  Administered 2020-03-16: 15 mg via ORAL
  Filled 2020-03-16: qty 2

## 2020-03-16 MED ORDER — MELOXICAM 15 MG PO TABS
15.0000 mg | ORAL_TABLET | Freq: Every day | ORAL | 0 refills | Status: DC
Start: 2020-03-16 — End: 2020-05-01

## 2020-03-16 MED ORDER — METHOCARBAMOL 500 MG PO TABS
1000.0000 mg | ORAL_TABLET | Freq: Once | ORAL | Status: DC
  Filled 2020-03-16: qty 2
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
## 2020-03-16 NOTE — ED Notes (Signed)
Provider notified of the BP of 185/96. Provider stated pt is still okay to be discharged, no new orders provided
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
## 2020-04-30 ENCOUNTER — Observation Stay
Admission: EM | Admit: 2020-04-30 | Discharge: 2020-05-01 | Disposition: A | Discharge status: Home / Self Care | Payer: Medicare Other | Attending: Internal Medicine | Admitting: Internal Medicine

## 2020-04-30 ENCOUNTER — Emergency Department: Payer: Medicare Other

## 2020-04-30 ENCOUNTER — Encounter: Payer: Self-pay | Admitting: Internal Medicine

## 2020-04-30 ENCOUNTER — Other Ambulatory Visit: Payer: Self-pay

## 2020-04-30 DIAGNOSIS — I152 Hypertension secondary to endocrine disorders: Secondary | ICD-10-CM | POA: Diagnosis present

## 2020-04-30 DIAGNOSIS — Z20822 Contact with and (suspected) exposure to covid-19: Secondary | ICD-10-CM | POA: Diagnosis not present

## 2020-04-30 DIAGNOSIS — R400 Somnolence: Secondary | ICD-10-CM

## 2020-04-30 DIAGNOSIS — I110 Hypertensive heart disease with heart failure: Secondary | ICD-10-CM | POA: Insufficient documentation

## 2020-04-30 DIAGNOSIS — E119 Type 2 diabetes mellitus without complications: Secondary | ICD-10-CM | POA: Diagnosis not present

## 2020-04-30 DIAGNOSIS — G9341 Metabolic encephalopathy: Secondary | ICD-10-CM | POA: Diagnosis not present

## 2020-04-30 DIAGNOSIS — Z7982 Long term (current) use of aspirin: Secondary | ICD-10-CM | POA: Insufficient documentation

## 2020-04-30 DIAGNOSIS — J449 Chronic obstructive pulmonary disease, unspecified: Secondary | ICD-10-CM | POA: Diagnosis not present

## 2020-04-30 DIAGNOSIS — I10 Essential (primary) hypertension: Secondary | ICD-10-CM | POA: Diagnosis present

## 2020-04-30 DIAGNOSIS — F17210 Nicotine dependence, cigarettes, uncomplicated: Secondary | ICD-10-CM | POA: Diagnosis not present

## 2020-04-30 DIAGNOSIS — F418 Other specified anxiety disorders: Secondary | ICD-10-CM | POA: Diagnosis present

## 2020-04-30 DIAGNOSIS — E1159 Type 2 diabetes mellitus with other circulatory complications: Secondary | ICD-10-CM | POA: Diagnosis present

## 2020-04-30 DIAGNOSIS — K219 Gastro-esophageal reflux disease without esophagitis: Secondary | ICD-10-CM | POA: Diagnosis present

## 2020-04-30 DIAGNOSIS — Z7984 Long term (current) use of oral hypoglycemic drugs: Secondary | ICD-10-CM | POA: Insufficient documentation

## 2020-04-30 DIAGNOSIS — E785 Hyperlipidemia, unspecified: Secondary | ICD-10-CM | POA: Diagnosis present

## 2020-04-30 DIAGNOSIS — I509 Heart failure, unspecified: Secondary | ICD-10-CM

## 2020-04-30 DIAGNOSIS — R4182 Altered mental status, unspecified: Secondary | ICD-10-CM | POA: Diagnosis present

## 2020-04-30 DIAGNOSIS — Z79899 Other long term (current) drug therapy: Secondary | ICD-10-CM | POA: Insufficient documentation

## 2020-04-30 DIAGNOSIS — M797 Fibromyalgia: Secondary | ICD-10-CM | POA: Diagnosis present

## 2020-04-30 LAB — COMPREHENSIVE METABOLIC PANEL
ALT: 10 U/L (ref 0–44)
AST: 15 U/L (ref 15–41)
Albumin: 4.1 g/dL (ref 3.5–5.0)
Alkaline Phosphatase: 53 U/L (ref 38–126)
Anion gap: 6 (ref 5–15)
BUN: 13 mg/dL (ref 8–23)
CO2: 30 mmol/L (ref 22–32)
Calcium: 9.2 mg/dL (ref 8.9–10.3)
Chloride: 104 mmol/L (ref 98–111)
Creatinine, Ser: 0.79 mg/dL (ref 0.44–1.00)
GFR, Estimated: >60 mL/min (ref >60)
Glucose, Bld: 102 mg/dL — ABNORMAL HIGH (ref 70–99)
Potassium: 3.6 mmol/L (ref 3.5–5.1)
Sodium: 140 mmol/L (ref 135–145)
Total Bilirubin: 0.6 mg/dL (ref 0.3–1.2)
Total Protein: 7.4 g/dL (ref 6.5–8.1)

## 2020-04-30 LAB — CBC
HCT: 39.8 % (ref 36.0–46.0)
Hemoglobin: 13.6 g/dL (ref 12.0–15.0)
MCH: 32.2 pg (ref 26.0–34.0)
MCHC: 34.2 g/dL (ref 30.0–36.0)
MCV: 94.3 fL (ref 80.0–100.0)
Platelets: 238 10*3/uL (ref 150–400)
RBC: 4.22 MIL/uL (ref 3.87–5.11)
RDW: 13.4 % (ref 11.5–15.5)
WBC: 8.8 10*3/uL (ref 4.0–10.5)
nRBC: 0 % (ref 0.0–0.2)

## 2020-04-30 LAB — URINALYSIS, COMPLETE (UACMP) WITH MICROSCOPIC
Bilirubin Urine: NEGATIVE
Glucose, UA: NEGATIVE mg/dL
Hgb urine dipstick: NEGATIVE
Ketones, ur: NEGATIVE mg/dL
Leukocytes,Ua: NEGATIVE
Nitrite: NEGATIVE
Protein, ur: NEGATIVE mg/dL
Specific Gravity, Urine: 1.012 (ref 1.005–1.030)
pH: 9 — ABNORMAL HIGH (ref 5.0–8.0)

## 2020-04-30 LAB — PHOSPHORUS: Phosphorus: 2.6 mg/dL (ref 2.5–4.6)

## 2020-04-30 LAB — URINE DRUG SCREEN, QUALITATIVE (ARMC ONLY)
Amphetamines, Ur Screen: NOT DETECTED
Barbiturates, Ur Screen: NOT DETECTED
Benzodiazepine, Ur Scrn: NOT DETECTED
Cannabinoid 50 Ng, Ur ~~LOC~~: NOT DETECTED
Cocaine Metabolite,Ur ~~LOC~~: NOT DETECTED
MDMA (Ecstasy)Ur Screen: NOT DETECTED
Methadone Scn, Ur: NOT DETECTED
Opiate, Ur Screen: NOT DETECTED
Phencyclidine (PCP) Ur S: NOT DETECTED
Tricyclic, Ur Screen: NOT DETECTED

## 2020-04-30 LAB — AMMONIA: Ammonia: 26 umol/L (ref 9–35)

## 2020-04-30 LAB — APTT: aPTT: 40 seconds — ABNORMAL HIGH (ref 24–36)

## 2020-04-30 LAB — TSH: TSH: 3.728 u[IU]/mL (ref 0.350–4.500)

## 2020-04-30 LAB — PROTIME-INR
INR: 1 (ref 0.8–1.2)
Prothrombin Time: 12.3 seconds (ref 11.4–15.2)

## 2020-04-30 LAB — GLUCOSE, CAPILLARY
Glucose-Capillary: 100 mg/dL — ABNORMAL HIGH (ref 70–99)
Glucose-Capillary: 161 mg/dL — ABNORMAL HIGH (ref 70–99)

## 2020-04-30 LAB — CBG MONITORING, ED: Glucose-Capillary: 96 mg/dL (ref 70–99)

## 2020-04-30 LAB — ACETAMINOPHEN LEVEL: Acetaminophen (Tylenol), Serum: <10 ug/mL — ABNORMAL LOW (ref 10–30)

## 2020-04-30 LAB — TROPONIN I (HIGH SENSITIVITY): Troponin I (High Sensitivity): 11 ng/L (ref <18)

## 2020-04-30 LAB — MAGNESIUM: Magnesium: 1.7 mg/dL (ref 1.7–2.4)

## 2020-04-30 LAB — SALICYLATE LEVEL: Salicylate Lvl: <7 mg/dL — ABNORMAL LOW (ref 7.0–30.0)

## 2020-04-30 LAB — SARS CORONAVIRUS 2 (TAT 6-24 HRS): SARS Coronavirus 2: NEGATIVE

## 2020-04-30 LAB — ETHANOL: Alcohol, Ethyl (B): <10 mg/dL (ref <10)

## 2020-04-30 LAB — HIV ANTIBODY (ROUTINE TESTING W REFLEX): HIV Screen 4th Generation wRfx: NONREACTIVE

## 2020-04-30 LAB — BRAIN NATRIURETIC PEPTIDE: B Natriuretic Peptide: 156.9 pg/mL — ABNORMAL HIGH (ref 0.0–100.0)

## 2020-04-30 MED ORDER — OXYCODONE-ACETAMINOPHEN 5-325 MG PO TABS
1.0000 | ORAL_TABLET | ORAL | Status: DC | PRN
  Administered 2020-04-30 – 2020-05-01 (×4): 1 via ORAL
  Filled 2020-04-30 (×4): qty 1

## 2020-04-30 MED ORDER — VITAMIN D3 25 MCG (1000 UNIT) PO TABS
2000.0000 [IU] | ORAL_TABLET | Freq: Every day | ORAL | Status: DC
  Administered 2020-05-01: 2000 [IU] via ORAL
  Filled 2020-04-30 (×2): qty 2

## 2020-04-30 MED ORDER — METOPROLOL SUCCINATE ER 50 MG PO TB24
50.0000 mg | ORAL_TABLET | Freq: Every day | ORAL | Status: DC
  Administered 2020-04-30: 50 mg via ORAL
  Filled 2020-04-30: qty 1

## 2020-04-30 MED ORDER — VITAMIN E 45 MG (100 UNIT) PO CAPS
200.0000 [IU] | ORAL_CAPSULE | Freq: Every day | ORAL | Status: DC
  Administered 2020-05-01: 200 [IU] via ORAL
  Filled 2020-04-30: qty 2

## 2020-04-30 MED ORDER — DICLOFENAC SODIUM 25 MG PO TBEC
50.0000 mg | DELAYED_RELEASE_TABLET | Freq: Two times a day (BID) | ORAL | Status: DC | PRN
  Administered 2020-04-30: 50 mg via ORAL
  Filled 2020-04-30 (×2): qty 2

## 2020-04-30 MED ORDER — ENOXAPARIN SODIUM 40 MG/0.4ML ~~LOC~~ SOLN
40.0000 mg | SUBCUTANEOUS | Status: DC
  Administered 2020-04-30: 40 mg via SUBCUTANEOUS
  Filled 2020-04-30: qty 0.4

## 2020-04-30 MED ORDER — PANTOPRAZOLE SODIUM 20 MG PO TBEC
20.0000 mg | DELAYED_RELEASE_TABLET | Freq: Every day | ORAL | Status: DC
  Administered 2020-04-30 – 2020-05-01 (×2): 20 mg via ORAL
  Filled 2020-04-30 (×2): qty 1

## 2020-04-30 MED ORDER — ALBUTEROL SULFATE HFA 108 (90 BASE) MCG/ACT IN AERS
2.0000 | INHALATION_SPRAY | RESPIRATORY_TRACT | Status: DC | PRN
  Filled 2020-04-30: qty 6.7

## 2020-04-30 MED ORDER — ROSUVASTATIN CALCIUM 10 MG PO TABS
20.0000 mg | ORAL_TABLET | Freq: Every day | ORAL | Status: DC
  Administered 2020-04-30: 20 mg via ORAL
  Filled 2020-04-30: qty 2

## 2020-04-30 MED ORDER — NICOTINE 21 MG/24HR TD PT24
21.0000 mg | MEDICATED_PATCH | Freq: Every day | TRANSDERMAL | Status: DC
  Filled 2020-04-30 (×2): qty 1

## 2020-04-30 MED ORDER — ASPIRIN EC 81 MG PO TBEC
81.0000 mg | DELAYED_RELEASE_TABLET | Freq: Every day | ORAL | Status: DC
  Administered 2020-04-30 – 2020-05-01 (×2): 81 mg via ORAL
  Filled 2020-04-30 (×2): qty 1

## 2020-04-30 MED ORDER — INSULIN ASPART 100 UNIT/ML ~~LOC~~ SOLN: 0.0000 [IU] | Freq: Every day | SUBCUTANEOUS | Status: DC

## 2020-04-30 MED ORDER — SENNOSIDES-DOCUSATE SODIUM 8.6-50 MG PO TABS: 1.0000 | ORAL_TABLET | Freq: Every evening | ORAL | Status: DC | PRN

## 2020-04-30 MED ORDER — DM-GUAIFENESIN ER 30-600 MG PO TB12
1.0000 | ORAL_TABLET | Freq: Two times a day (BID) | ORAL | Status: DC | PRN
  Filled 2020-04-30: qty 1

## 2020-04-30 MED ORDER — AMPHETAMINE-DEXTROAMPHETAMINE 20 MG PO TABS: 20.0000 mg | ORAL_TABLET | Freq: Every day | ORAL | Status: DC

## 2020-04-30 MED ORDER — LORAZEPAM 2 MG/ML IJ SOLN
1.0000 mg | INTRAMUSCULAR | Status: DC | PRN
  Administered 2020-04-30: 1 mg via INTRAVENOUS
  Filled 2020-04-30: qty 1

## 2020-04-30 MED ORDER — MOMETASONE FURO-FORMOTEROL FUM 200-5 MCG/ACT IN AERO
2.0000 | INHALATION_SPRAY | Freq: Two times a day (BID) | RESPIRATORY_TRACT | Status: DC
  Administered 2020-04-30 – 2020-05-01 (×2): 2 via RESPIRATORY_TRACT
  Filled 2020-04-30: qty 8.8

## 2020-04-30 MED ORDER — VITAMIN B-12 1000 MCG PO TABS
2000.0000 ug | ORAL_TABLET | Freq: Every day | ORAL | Status: DC
  Administered 2020-05-01: 2000 ug via ORAL
  Filled 2020-04-30: qty 2

## 2020-04-30 MED ORDER — AMPHETAMINE-DEXTROAMPHETAMINE 10 MG PO TABS
30.0000 mg | ORAL_TABLET | Freq: Two times a day (BID) | ORAL | Status: DC
  Administered 2020-04-30 – 2020-05-01 (×2): 30 mg via ORAL
  Filled 2020-04-30: qty 3

## 2020-04-30 MED ORDER — SODIUM CHLORIDE 0.9 % IV SOLN: INTRAVENOUS | Status: DC

## 2020-04-30 MED ORDER — AMPHETAMINE-DEXTROAMPHETAMINE 20 MG PO TABS
30.0000 mg | ORAL_TABLET | Freq: Two times a day (BID) | ORAL | Status: DC
  Filled 2020-04-30: qty 2

## 2020-04-30 MED ORDER — AMLODIPINE BESYLATE 5 MG PO TABS
2.5000 mg | ORAL_TABLET | Freq: Every day | ORAL | Status: DC
  Administered 2020-04-30 – 2020-05-01 (×2): 2.5 mg via ORAL
  Filled 2020-04-30 (×2): qty 1

## 2020-04-30 MED ORDER — ONDANSETRON HCL 4 MG/2ML IJ SOLN: 4.0000 mg | Freq: Three times a day (TID) | INTRAMUSCULAR | Status: DC | PRN

## 2020-04-30 MED ORDER — HYDRALAZINE HCL 20 MG/ML IJ SOLN: 5.0000 mg | INTRAMUSCULAR | Status: DC | PRN

## 2020-04-30 MED ORDER — AMPHETAMINE-DEXTROAMPHETAMINE 10 MG PO TABS
20.0000 mg | ORAL_TABLET | Freq: Every day | ORAL | Status: DC
  Administered 2020-05-01: 20 mg via ORAL
  Filled 2020-04-30 (×2): qty 2

## 2020-04-30 MED ORDER — FLUTICASONE PROPIONATE 50 MCG/ACT NA SUSP
1.0000 | Freq: Every day | NASAL | Status: DC
  Administered 2020-05-01: 1 via NASAL
  Filled 2020-04-30: qty 16

## 2020-04-30 MED ORDER — LISINOPRIL 10 MG PO TABS
10.0000 mg | ORAL_TABLET | Freq: Every day | ORAL | Status: DC
  Administered 2020-04-30 – 2020-05-01 (×2): 10 mg via ORAL
  Filled 2020-04-30 (×2): qty 1

## 2020-04-30 MED ORDER — INSULIN ASPART 100 UNIT/ML ~~LOC~~ SOLN: 0.0000 [IU] | Freq: Three times a day (TID) | SUBCUTANEOUS | Status: DC

## 2020-04-30 NOTE — Plan of Care (Signed)

## 2020-04-30 NOTE — ED Notes (Signed)
X-ray at bedside

## 2020-04-30 NOTE — Progress Notes (Signed)
Pt refused cpap

## 2020-04-30 NOTE — ED Notes (Addendum)
Pt reports hx COPD and supposed to wear 3L oxygen while sleeping
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
## 2020-04-30 NOTE — ED Provider Notes (Addendum)
[facility] Emergency Department Provider Note   ____________________________________________   Event Date/Time   First MD Initiated Contact with Patient 04/30/20 [id number]     (approximate)  I have reviewed the triage vital signs and the nursing notes.   HISTORY  Chief Complaint Altered Mental Status  EM caveat: Altered mental status, history provided by EMS as well as patient's daughter who was able to get a hold of later  HPI Angela Daniel is a 65 y.o. female history of CHF COPD  and recent chronic hip pain  EMS reports called out for unresponsiveness.  Symptoms evidently first happened around 2 AM EMS was called later.  EMS reports patient was disoriented possibly had a seizure.  Patient reports she feels tired.  Cannot really describe well.  Denies being in any pain.  Particularly denies chest pain no headache denies shortness of breath.  She cannot really tell me circumstances of situation that occurred.  She does report going to bed, does not recall paramedics coming out of the house.  Nursing reports the patient was found to have a baclofen prescription tucked into her bra.  Past Medical History:  Diagnosis Date  . Congestive heart failure ([place])   . COPD (chronic obstructive pulmonary disease) ([place])   . Degenerative joint disease   . Diabetes mellitus without complication ([place])   . Fibromyalgia   . GERD (gastroesophageal reflux disease)   . Hypertension   . Scoliosis   . Sleep apnea     Patient Active Problem List   Diagnosis Date Noted  . Acute respiratory failure with hypoxia ([place]) 11/07/2014  . COPD exacerbation ([place]) 11/07/2014  . CAP (community acquired pneumonia) 11/07/2014    Past Surgical History:  Procedure Laterality Date  . ABDOMINAL HYSTERECTOMY    . APPENDECTOMY    . CESAREAN SECTION     x3  . CHOLECYSTECTOMY    . HIP SURGERY Left    joint replacement  . KYPHOPLASTY N/A 05/18/2019   Procedure: T6 KYPHOPLASTY;   Surgeon: Hessie Knows, MD;  Location: [facility] ORS;  Service: Orthopedics;  Laterality: N/A;    Prior to Admission medications   Medication Sig Start Date End Date Taking? Authorizing Provider  albuterol (PROVENTIL HFA;VENTOLIN HFA) 108 (90 BASE) MCG/ACT inhaler Inhale 2 puffs into the lungs every 4 (four) hours as needed for wheezing or shortness of breath.     [provider]  alprazolam Duanne Moron) 2 MG tablet Take 2 mg by mouth 3 (three) times daily.     [provider]  amphetamine-dextroamphetamine (ADDERALL) 20 MG tablet Take 20 mg by mouth daily at 12 noon. 04/26/19   [provider]  amphetamine-dextroamphetamine (ADDERALL) 30 MG tablet Take 30 mg by mouth 2 (two) times daily. Morning and evening    [provider]  ARIPiprazole (ABILIFY) 15 MG tablet Take 15 mg by mouth daily.    [provider]  aspirin EC 81 MG tablet Take 81 mg by mouth daily.    [provider]  atorvastatin (LIPITOR) 80 MG tablet Take 80 mg by mouth daily. 09/02/19   [provider]  buprenorphine (SUBUTEX) 8 MG SUBL SL tablet Place 8 mg under the tongue 3 (three) times daily. 02/04/19   [provider]  Cholecalciferol (VITAMIN D3) 50 MCG (2000 UT) TABS Take 2,000 mg by mouth daily.    [provider]  cyanocobalamin 2000 MCG tablet Take 2,000 mcg by mouth daily.    [provider]  diclofenac (  VOLTAREN) 50 MG EC tablet Take 50 mg by mouth 2 (two) times daily.  04/12/19   [provider]  esomeprazole (NEXIUM) 40 MG capsule Take 40 mg by mouth daily. 02/27/19   [provider]  Fluticasone-Salmeterol (ADVAIR) 250-50 MCG/DOSE AEPB Inhale 1 puff into the lungs daily.     [provider]  gabapentin (NEURONTIN) 600 MG tablet Take 600 mg by mouth. 07/04/19   [provider]  gabapentin (NEURONTIN) 800 MG tablet Take 800 mg by mouth 2 (two) times daily.     [provider]  ketorolac (TORADOL) 10 MG  tablet TAKE 1 TABLET (10 MG TOTAL) BY MOUTH EVERY 6 (SIX) HOURS AS NEEDED FOR UP TO 2 DAYS. 08/10/18   [provider]  lisinopril (ZESTRIL) 5 MG tablet Take 5 mg by mouth daily. 09/27/19   [provider]  meloxicam (MOBIC) 15 MG tablet Take 1 tablet (15 mg total) by mouth daily. 03/16/20   Cuthriell, Charline Bills, PA-C  metFORMIN (GLUCOPHAGE) 500 MG tablet Take 500 mg by mouth 2 (two) times daily with a meal.     [provider]  methocarbamol (ROBAXIN) 500 MG tablet Take 1 tablet (500 mg total) by mouth 4 (four) times daily. 03/16/20   Cuthriell, Charline Bills, PA-C  metoprolol succinate (TOPROL-XL) 25 MG 24 hr tablet Take 25 mg by mouth daily.    [provider]  naproxen (NAPROSYN) 500 MG tablet Take 1 tablet (500 mg total) by mouth 2 (two) times daily with a meal. 05/04/19   Lavonia Drafts, MD  [facility] 4 MG/0.1ML LIQD nasal spray kit SMARTSIG:Both Nares 09/20/19   [provider]  ondansetron (ZOFRAN ODT) 4 MG disintegrating tablet Take 1 tablet (4 mg total) by mouth every 8 (eight) hours as needed for nausea or vomiting. 07/28/19   Vanessa [place], MD  pantoprazole (PROTONIX) 20 MG tablet TAKE ONE TABLET TWICE DAILY FOR TWO WEEKS, THEN ONE TABLET DAILY 08/31/19   [provider]  pravastatin (PRAVACHOL) 40 MG tablet Take 1 tablet (40 mg total) by mouth daily. 11/09/14   Vaughan Basta, MD  rosuvastatin (CRESTOR) 20 MG tablet Take 20 mg by mouth daily. 09/29/19   [provider]  tiotropium (SPIRIVA) 18 MCG inhalation capsule Place 18 mcg into inhaler and inhale daily.    [provider]  tiZANidine (ZANAFLEX) 4 MG tablet Take 4 mg by mouth 3 (three) times daily. 04/22/19   [provider]  vitamin E 200 UNIT capsule Take 200 Units by mouth daily.    [provider]  zinc gluconate 50 MG tablet Take 50 mg by mouth daily.    [provider]    Allergies Tramadol, Tylenol [acetaminophen], and Vicodin  [hydrocodone-acetaminophen]  Family History  Problem Relation Age of Onset  . CVA Mother   . Lung cancer Mother   . Diabetes Mother   . Heart attack Father   . Hypertension Father   . Lung cancer Father   . Asthma Father   . Breast cancer Neg Hx     Social History Social History   Tobacco Use  . Smoking status: Current Every Day Smoker    Packs/day: 0.50    Types: Cigarettes  . Smokeless tobacco: Never Used  Vaping Use  . Vaping Use: Never used  Substance Use Topics  . Alcohol use: No  . Drug use: No    Review of Systems  EM caveat   ____________________________________________   PHYSICAL EXAM:  VITAL SIGNS:  ED Triage Vitals [04/30/20 0603]  Enc Vitals Group     BP 132/85     Pulse Rate 78     Resp (!) 28     Temp 98.6 F (37 C)     Temp Source Oral     SpO2 99 %     Weight 136 lb 0.4 oz (61.7 kg)     Height 5' 3"  (1.6 m)     Head Circumference      Peak Flow      Pain Score 0     Pain Loc      Pain Edu?      Excl. in [place]?     Constitutional: Somnolent but oriented to being at the hospital and year.  Denies any acute concerns.  She alerts to voice, tracks examiner, but dozes back to resting rather rapidly when not actively engaged Eyes: Conjunctivae are normal. Head: Atraumatic. Nose: No congestion/rhinnorhea. Mouth/Throat: Mucous membranes are slightly dry. Neck: No stridor.  Cardiovascular: Normal rate, regular rhythm. Grossly normal heart sounds.  Good peripheral circulation. Respiratory: Normal respiratory effort.  No retractions. Lungs CTAB.  Lungs are clear.  No distress noted. Gastrointestinal: Soft and nontender. No distention. Musculoskeletal: No lower extremity tenderness nor edema.  She does move all extremities to command.  Grossly neurologically intact. Neurologic: Soft speech. No gross focal neurologic deficits are appreciated she demonstrates normal strength in all extremities, no pronator drift, normal sensation all extremities,  equal grip strength bilateral, normal smile, normal extraocular movements. VAN negative for LVO.  No visual field deficits.  No aphasia.  No neglect. Skin:  Skin is warm, dry and intact. No rash noted. Psychiatric: Mood and affect are normal. Speech and behavior are normal to somewhat sedate.  ____________________________________________   LABS (all labs ordered are listed, but only abnormal results are displayed)  Labs Reviewed  COMPREHENSIVE METABOLIC PANEL - Abnormal; Notable for the following components:      Result Value   Glucose, Bld 102 (*)    All other components within normal limits  ACETAMINOPHEN LEVEL - Abnormal; Notable for the following components:   Acetaminophen (Tylenol), Serum <10 (*)    All other components within normal limits  SALICYLATE LEVEL - Abnormal; Notable for the following components:   Salicylate Lvl <0.3 (*)    All other components within normal limits  APTT - Abnormal; Notable for the following components:   aPTT 40 (*)    All other components within normal limits  URINALYSIS, COMPLETE (UACMP) WITH MICROSCOPIC - Abnormal; Notable for the following components:   Color, Urine STRAW (*)    APPearance CLEAR (*)    pH 9.0 (*)    Bacteria, UA RARE (*)    All other components within normal limits  BLOOD GAS, VENOUS - Abnormal; Notable for the following components:   Bicarbonate 33.3 (*)    Acid-Base Excess 6.7 (*)    All other components within normal limits  SARS CORONAVIRUS 2 (TAT 6-24 HRS)  CBC  MAGNESIUM  PHOSPHORUS  ETHANOL  URINE DRUG SCREEN, QUALITATIVE ([facility] ONLY)  PROTIME-INR  AMMONIA  TSH  CBG MONITORING, ED  CBG MONITORING, ED  CBG MONITORING, ED  TROPONIN I (HIGH SENSITIVITY)   ____________________________________________  EKG  Reviewed inter by me at 610 Heart rate 70 QRs 100 QTc 440 Normal sinus rhythm no evidence of acute ischemia.  Incomplete right bundle branch block  pattern ____________________________________________  RADIOLOGY  CT HEAD WO CONTRAST  Result Date: 04/30/2020 CLINICAL DATA:  65 year old female with history of delirium. EXAM: CT HEAD WITHOUT CONTRAST TECHNIQUE: Contiguous axial images were obtained from the base of the skull through the vertex without intravenous contrast. COMPARISON:  Head CT 03/16/2020. FINDINGS: Brain: Patchy and confluent areas of decreased attenuation are noted throughout the deep and periventricular white matter of the cerebral hemispheres bilaterally, compatible with chronic microvascular ischemic disease. Well-defined foci of low attenuation are noted in the basal ganglia bilaterally, compatible with old lacunar infarcts. No evidence of acute infarction, hemorrhage, hydrocephalus, extra-axial collection or mass lesion/mass effect. Vascular: No hyperdense vessel or unexpected calcification. Skull: Normal. Negative for fracture or focal lesion. Sinuses/Orbits: No acute finding. Other: None. IMPRESSION: 1. No acute intracranial abnormalities. 2. 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 Electronically Signed   By: Vinnie Langton M.D.   On: 04/30/2020 06:56    CT the head negative for acute findings.  Old basal ganglia infarct noted. ____________________________________________   PROCEDURES  Procedure(s) performed: None  Procedures  Critical Care performed: No  ____________________________________________   INITIAL IMPRESSION / ASSESSMENT AND PLAN / ED COURSE  Pertinent labs & imaging results that were available during my care of the patient were reviewed by me and considered in my medical decision making (see chart for details).   Patient presents for what appears to be an episode of decreased responsiveness, possible seizure-like activity though this is somewhat unclear.  She is overall somnolent seems to  present almost with a mild encephalopathic appearance.  She does not demonstrate any focal acute neurologic deficits.  There is no evidence of trauma on exam.  She is not complaining of anything other than just feeling a bit tired.  No evidence of acute distress or respiratory distress.  Initiate broad work-up.  Etiology of presentation is somewhat unclear.  Clinical Course as of 04/30/20 0751  Sun Apr 30, 2020  0735 Discussed with daughter (Ms. Freeman Caldron) who reports patient had been on xanax long term and knew she had seizures years ago related. They found her bent over a chair "half sleeping" and shallow breathing early this morning. She got more lucid later on but has been disoriented but was able to walk. Occurred around 2am, daughter last saw her well (via a phone call) yesterday around 7pm. Patient is on suboxone per daughter but also takes percocet, xanax. Daugther reports a similar issue happened once before and thinks it was due to low oxgen at that point.  [MQ]    Clinical Course User Index [MQ] Delman Kitten, MD   ----------------------------------------- 7:50 AM on 04/30/2020 -----------------------------------------  Ongoing care assigned to Dr. Cheri Fowler.  Follow-up on plan for admission, as well as pending chest x-ray and remaining labs.  ____________________________________________   FINAL CLINICAL IMPRESSION(S) / ED DIAGNOSES  Final diagnoses:  Somnolence        Note:  This document was prepared using Dragon voice recognition software and may include unintentional dictation errors       Delman Kitten, MD 04/30/20 0177    Delman Kitten, MD 04/30/20 [phone]

## 2020-04-30 NOTE — H&P (Signed)
History and Physical    Angela Daniel KNL:[id number] DOB: [birth year] DOA: 04/30/2020  Referring MD/NP/PA:   PCP: [facility], [place], NP   Patient coming from:  The patient is coming from home.  At baseline, pt is independent for most of ADL.        Chief Complaint: AMS  HPI: Angela Daniel is a 65 y.o. female with medical history significant of HTN, HLD, DM, COPD, OSA, GERD, depression and anxiety, CHF, tobacco abuse, scoliosis, fibromyalgia on subutex, who presents with AMS.   Patient has AMS, and is unable to provide accurate medical history, therefore, most of the history is obtained by discussing the case with ED physician, per EMS report, and with the nursing staff. I have tried to call her daughter without success.  I could not leave message since mailbox is not set up.  History is limited  Per report, pt was last known normal at about 7 PM yesterday.  Patient was found to be unresponsive at about 2 AM in the early morning, but with breathing. Patient woke up after few seconds and was disorientated. When I saw pt in ED, pt is somnolent, but arousable.  She knows her own name and knows that she is in hospital, but confused about time.  She moves all extremities.  No facial droop or slurred speech.  No active respiratory distress, cough, nausea, vomiting, diarrhea noted.  Patient does not seem to have chest pain or abdominal pain.  Not sure if patient has symptoms of UTI.  Per report, her daughter reported that patient has been on Xanax for a long time.  Pt had hx of seizure few years ago which might be related to Xanax use. Patient is on suboxone, but also takes percocet, xanax and baclofen. Her daugher suspects that pt may have seizure, but sounds like that her daughter did not witness a seizure.  Daughter does not think patient has any chest pain, shortness breath, nausea, vomiting, diarrhea or abdominal pain.  Addendum: later on, her daughter called me back.  Basically  daughter confirmed the story as reported above. Daughter states that patient had remote seizure many years ago which might be related to Xanax use.  Currently patient is taking as needed Percocet, Suboxone, Xanax, baclofen and Neurontin for pain.  ED Course: pt was found to have WBC 8.8, INR 1.0, UDS negative, pending urinalysis, pending COVID-19 PCR, electrolytes renal function okay, temperature normal, blood pressure 154/81, heart rate 72, RR 28, 17, oxygen saturation 96% on room air, VBG with pH 7.39, CO2 55.  CT head negative for acute intracranial abnormalities.  Pending chest x-ray.  Patient is placed on MedSurg bed for observation.  Review of Systems: Could not be reviewed accurately due to altered mental status  Allergy:  Allergies  Allergen Reactions  . Tramadol Hives  . Tylenol [Acetaminophen] Nausea And Vomiting  . Vicodin [Hydrocodone-Acetaminophen] Hives    Past Medical History:  Diagnosis Date  . Congestive heart failure ([place])   . COPD (chronic obstructive pulmonary disease) ([place])   . Degenerative joint disease   . Diabetes mellitus without complication ([place])   . Fibromyalgia   . GERD (gastroesophageal reflux disease)   . Hypertension   . Scoliosis   . Sleep apnea     Past Surgical History:  Procedure Laterality Date  . ABDOMINAL HYSTERECTOMY    . APPENDECTOMY    . CESAREAN SECTION     x3  . CHOLECYSTECTOMY    . HIP  SURGERY Left    joint replacement  . KYPHOPLASTY N/A 05/18/2019   Procedure: T6 KYPHOPLASTY;  Surgeon: Hessie Knows, MD;  Location: [facility] ORS;  Service: Orthopedics;  Laterality: N/A;    Social History:  reports that she has been smoking cigarettes. She has been smoking about 0.50 packs per day. She has never used smokeless tobacco. She reports that she does not drink alcohol and does not use drugs.  Family History:  Family History  Problem Relation Age of Onset  . CVA Mother   . Lung cancer Mother   . Diabetes Mother   . Heart attack Father    . Hypertension Father   . Lung cancer Father   . Asthma Father   . Breast cancer Neg Hx      Prior to Admission medications   Medication Sig Start Date End Date Taking? Authorizing Provider  albuterol (PROVENTIL HFA;VENTOLIN HFA) 108 (90 BASE) MCG/ACT inhaler Inhale 2 puffs into the lungs every 4 (four) hours as needed for wheezing or shortness of breath.     [provider]  alprazolam Duanne Moron) 2 MG tablet Take 2 mg by mouth 3 (three) times daily.     [provider]  amphetamine-dextroamphetamine (ADDERALL) 20 MG tablet Take 20 mg by mouth daily at 12 noon. 04/26/19   [provider]  amphetamine-dextroamphetamine (ADDERALL) 30 MG tablet Take 30 mg by mouth 2 (two) times daily. Morning and evening    [provider]  ARIPiprazole (ABILIFY) 15 MG tablet Take 15 mg by mouth daily.    [provider]  aspirin EC 81 MG tablet Take 81 mg by mouth daily.    [provider]  atorvastatin (LIPITOR) 80 MG tablet Take 80 mg by mouth daily. 09/02/19   [provider]  buprenorphine (SUBUTEX) 8 MG SUBL SL tablet Place 8 mg under the tongue 3 (three) times daily. 02/04/19   [provider]  Cholecalciferol (VITAMIN D3) 50 MCG (2000 UT) TABS Take 2,000 mg by mouth daily.    [provider]  cyanocobalamin 2000 MCG tablet Take 2,000 mcg by mouth daily.    [provider]  diclofenac (VOLTAREN) 50 MG EC tablet Take 50 mg by mouth 2 (two) times daily.  04/12/19   [provider]  esomeprazole (NEXIUM) 40 MG capsule Take 40 mg by mouth daily. 02/27/19   [provider]  Fluticasone-Salmeterol (ADVAIR) 250-50 MCG/DOSE AEPB Inhale 1 puff into the lungs daily.     [provider]  gabapentin (NEURONTIN) 600 MG tablet Take 600 mg by mouth. 07/04/19   [provider]  gabapentin (NEURONTIN) 800 MG tablet Take 800 mg by mouth 2 (two) times daily.     [provider]  ketorolac (TORADOL) 10  MG tablet TAKE 1 TABLET (10 MG TOTAL) BY MOUTH EVERY 6 (SIX) HOURS AS NEEDED FOR UP TO 2 DAYS. 08/10/18   [provider]  lisinopril (ZESTRIL) 5 MG tablet Take 5 mg by mouth daily. 09/27/19   [provider]  meloxicam (MOBIC) 15 MG tablet Take 1 tablet (15 mg total) by mouth daily. 03/16/20   Cuthriell, Charline Bills, PA-C  metFORMIN (GLUCOPHAGE) 500 MG tablet Take 500 mg by mouth 2 (two) times daily with a meal.     [provider]  methocarbamol (ROBAXIN) 500 MG tablet Take 1 tablet (500 mg total) by mouth 4 (four) times daily. 03/16/20   Cuthriell, Charline Bills, PA-C  metoprolol succinate (TOPROL-XL) 25 MG 24 hr tablet Take  25 mg by mouth daily.    [provider]  naproxen (NAPROSYN) 500 MG tablet Take 1 tablet (500 mg total) by mouth 2 (two) times daily with a meal. 05/04/19   Lavonia Drafts, MD  [facility] 4 MG/0.1ML LIQD nasal spray kit SMARTSIG:Both Nares 09/20/19   [provider]  ondansetron (ZOFRAN ODT) 4 MG disintegrating tablet Take 1 tablet (4 mg total) by mouth every 8 (eight) hours as needed for nausea or vomiting. 07/28/19   Vanessa Mooreland, MD  pantoprazole (PROTONIX) 20 MG tablet TAKE ONE TABLET TWICE DAILY FOR TWO WEEKS, THEN ONE TABLET DAILY 08/31/19   [provider]  pravastatin (PRAVACHOL) 40 MG tablet Take 1 tablet (40 mg total) by mouth daily. 11/09/14   Vaughan Basta, MD  rosuvastatin (CRESTOR) 20 MG tablet Take 20 mg by mouth daily. 09/29/19   [provider]  tiotropium (SPIRIVA) 18 MCG inhalation capsule Place 18 mcg into inhaler and inhale daily.    [provider]  tiZANidine (ZANAFLEX) 4 MG tablet Take 4 mg by mouth 3 (three) times daily. 04/22/19   [provider]  vitamin E 200 UNIT capsule Take 200 Units by mouth daily.    [provider]  zinc gluconate 50 MG tablet Take 50 mg by mouth daily.    [provider]    Physical Exam: Vitals:   04/30/20 0608 04/30/20 0630 04/30/20  0715 04/30/20 0738  BP:  (!) 154/74  (!) 154/81  Pulse:  71 67 72  Resp:  15 17 16   Temp:      TempSrc:      SpO2: 97% 94% 93% 96%  Weight:      Height:       General: Not in acute distress HEENT:       Eyes: PERRL, EOMI, no scleral icterus.       ENT: No discharge from the ears and nose      Neck: No JVD, no bruit, no mass felt. Heme: No neck lymph node enlargement. Cardiac: S1/S2, RRR, No murmurs, No gallops or rubs. Respiratory: No rales, wheezing, rhonchi or rubs. GI: Soft, nondistended, nontender, no organomegaly, BS present. GU: No hematuria Ext: No pitting leg edema bilaterally. 1+DP/PT pulse bilaterally. Musculoskeletal: No joint deformities, No joint redness or warmth, no limitation of ROM in spin. Skin: No rashes.  Neuro: Patient is somnolent, but arousable, partially following command, knows her own name, knows that she is in hospital, but she is confused about time. Cranial nerves II-XII grossly intact, moves all extremities. Psych: Patient is not psychotic, no suicidal or hemocidal ideation.  Labs on Admission: I have personally reviewed following labs and imaging studies  CBC: Recent Labs  Lab 04/30/20 0605  WBC 8.8  HGB 13.6  HCT 39.8  MCV 94.3  PLT 865   Basic Metabolic Panel: Recent Labs  Lab 04/30/20 0605  NA 140  K 3.6  CL 104  CO2 30  GLUCOSE 102*  BUN 13  CREATININE 0.79  CALCIUM 9.2  MG 1.7  PHOS 2.6   GFR: Estimated Creatinine Clearance: 57.2 mL/min (by C-G formula based on SCr of 0.79 mg/dL). Liver Function Tests: Recent Labs  Lab 04/30/20 0605  AST 15  ALT 10  ALKPHOS 53  BILITOT 0.6  PROT 7.4  ALBUMIN 4.1   No results for input(s): LIPASE, AMYLASE in the last 168 hours. No results for input(s): AMMONIA in the last 168 hours. Coagulation Profile: Recent Labs  Lab 04/30/20 0605  INR  1.0   Cardiac Enzymes: No results for input(s): CKTOTAL, CKMB, CKMBINDEX, TROPONINI in the last 168 hours. BNP (last 3 results) No  results for input(s): PROBNP in the last 8760 hours. HbA1C: No results for input(s): HGBA1C in the last 72 hours. CBG: Recent Labs  Lab 04/30/20 0604  GLUCAP 96   Lipid Profile: No results for input(s): CHOL, HDL, LDLCALC, TRIG, CHOLHDL, LDLDIRECT in the last 72 hours. Thyroid Function Tests: No results for input(s): TSH, T4TOTAL, FREET4, T3FREE, THYROIDAB in the last 72 hours. Anemia Panel: No results for input(s): VITAMINB12, FOLATE, FERRITIN, TIBC, IRON, RETICCTPCT in the last 72 hours. Urine analysis:    Component Value Date/Time   COLORURINE STRAW (A) 04/30/2020 0554   APPEARANCEUR CLEAR (A) 04/30/2020 0554   APPEARANCEUR Cloudy (A) 10/22/2019 1025   LABSPEC 1.012 04/30/2020 0554   PHURINE 9.0 (H) 04/30/2020 0554   GLUCOSEU NEGATIVE 04/30/2020 0554   HGBUR NEGATIVE 04/30/2020 0554   BILIRUBINUR NEGATIVE 04/30/2020 0554   BILIRUBINUR Negative 10/22/2019 1025   KETONESUR NEGATIVE 04/30/2020 0554   PROTEINUR NEGATIVE 04/30/2020 0554   NITRITE NEGATIVE 04/30/2020 0554   LEUKOCYTESUR NEGATIVE 04/30/2020 0554   Sepsis Labs: @LABRCNTIP (procalcitonin:4,lacticidven:4) )No results found for this or any previous visit (from the past 240 hour(s)).   Radiological Exams on Admission: CT HEAD WO CONTRAST  Result Date: 04/30/2020 CLINICAL DATA:  65 year old female with history of delirium. EXAM: CT HEAD WITHOUT CONTRAST TECHNIQUE: Contiguous axial images were obtained from the base of the skull through the vertex without intravenous contrast. COMPARISON:  Head CT 03/16/2020. FINDINGS: Brain: Patchy and confluent areas of decreased attenuation are noted throughout the deep and periventricular white matter of the cerebral hemispheres bilaterally, compatible with chronic microvascular ischemic disease. Well-defined foci of low attenuation are noted in the basal ganglia bilaterally, compatible with old lacunar infarcts. No evidence of acute infarction, hemorrhage, hydrocephalus, extra-axial  collection or mass lesion/mass effect. Vascular: No hyperdense vessel or unexpected calcification. Skull: Normal. Negative for fracture or focal lesion. Sinuses/Orbits: No acute finding. Other: None. IMPRESSION: 1. No acute intracranial abnormalities. 2. 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 Electronically Signed   By: Vinnie Langton M.D.   On: 04/30/2020 06:56   DG Chest Portable 1 View  Result Date: 04/30/2020 CLINICAL DATA:  Altered mental status EXAM: PORTABLE CHEST 1 VIEW COMPARISON:  May 04, 2019 chest radiograph and chest CT FINDINGS: There is scarring in the left base region. There is no edema or airspace opacity. Heart is upper normal in size with pulmonary vascularity normal. No adenopathy. Findings indicative of previous kyphoplasty in the midthoracic region. IMPRESSION: Apparent scarring left base. No edema or airspace opacity. Stable cardiac silhouette. Electronically Signed   By: Lowella Grip III M.D.   On: 04/30/2020 08:24     EKG: I have personally reviewed.  Sinus rhythm, QTC 438, borderline LAD, nonspecific T wave change  Assessment/Plan Principal Problem:   Acute metabolic encephalopathy Active Problems:   COPD (chronic obstructive pulmonary disease) (HCC)   Hypertension   GERD (gastroesophageal reflux disease)   Fibromyalgia   Diabetes mellitus without complication (H[facility])   Congestive heart failure (H[facility])   HLD (hyperlipidemia)   Depression with anxiety   Acute metabolic encephalopathy: Etiology is not clear.  CT head negative for acute intracranial abnormalities.  No focal deficit on physical examination.  Potential differential diagnosis is polypharmacy, but her UDS is negative. Seizure is another differential diagnosis.  Of note, Percocet and baclofen are not on patient's medication list, but patient's daughter states that patient is taking those 2  medications as needed.  -Placed on MedSurg bed for observation -Hold all sedative medications: Xanax, Abilify, Subuex, Neurontin, tizanidine, Percocet, baclofen  -Seizure precaution -When necessary Ativan for seizure -Frequently neuro check -EEG  Depression with anxiety: -hold all home meds now: Abilify, Xanax  COPD (chronic obstructive pulmonary disease) ([place]): Stable -Bronchodilators and as needed Mucinex  Hypertension -IV hydralazine as needed -Lisinopril, metoprolol, metoprolol  GERD (gastroesophageal reflux disease) -Protonix  Fibromyalgia -Hold home pain medications -Hold home baclofen, Neurontin, Subutex, Percocet, tizanidine,  Diabetes mellitus without complication ([place]): [place] available.  Patient still taking Metformin.  Blood sugar 102 today -Sliding scale insulin -Check A1c  History of congestive heart failure ([place]): No 2D echo on record.  Not sure which type of CHF.  Given long history of hypertension, patient may have diastolic CHF.  No leg edema or JVD.  No respiratory distress.  CHF seem to be compensated.  Patient is not taking diuretics currently -Check BNP  HLD: -on Statin, crestor       DVT ppx: SQ Lovenox Code Status: Full code Family Communication: Spoke with her daughter by phone Disposition Plan:  Anticipate discharge back to previous environment Consults called: none Admission status and Level of care: Med-Surg:   For obs     Status is: Observation  The patient remains OBS appropriate and will d/c before 2 midnights.  Dispo: The patient is from: Home              Anticipated d/c is to: Home              Patient currently is not medically stable to d/c.   Difficult to place patient No           Date of Service 04/30/2020    [facility]   If 7PM-7AM, please contact night-coverage [email or web address] 04/30/2020, 8:48 AM

## 2020-05-01 ENCOUNTER — Observation Stay: Payer: Medicare Other

## 2020-05-01 DIAGNOSIS — G9341 Metabolic encephalopathy: Secondary | ICD-10-CM | POA: Diagnosis not present

## 2020-05-01 LAB — GLUCOSE, CAPILLARY
Glucose-Capillary: 101 mg/dL — ABNORMAL HIGH (ref 70–99)
Glucose-Capillary: 101 mg/dL — ABNORMAL HIGH (ref 70–99)
Glucose-Capillary: 111 mg/dL — ABNORMAL HIGH (ref 70–99)

## 2020-05-01 LAB — BASIC METABOLIC PANEL
Anion gap: 9 (ref 5–15)
BUN: 14 mg/dL (ref 8–23)
CO2: 24 mmol/L (ref 22–32)
Calcium: 8.5 mg/dL — ABNORMAL LOW (ref 8.9–10.3)
Chloride: 105 mmol/L (ref 98–111)
Creatinine, Ser: 0.64 mg/dL (ref 0.44–1.00)
GFR, Estimated: >60 mL/min (ref >60)
Glucose, Bld: 99 mg/dL (ref 70–99)
Potassium: 3.2 mmol/L — ABNORMAL LOW (ref 3.5–5.1)
Sodium: 138 mmol/L (ref 135–145)

## 2020-05-01 LAB — HEMOGLOBIN A1C
Hgb A1c MFr Bld: 6 % — ABNORMAL HIGH (ref 4.8–5.6)
Mean Plasma Glucose: 125.5 mg/dL

## 2020-05-01 LAB — CBC
HCT: 35.5 % — ABNORMAL LOW (ref 36.0–46.0)
Hemoglobin: 11.8 g/dL — ABNORMAL LOW (ref 12.0–15.0)
MCH: 30.7 pg (ref 26.0–34.0)
MCHC: 33.2 g/dL (ref 30.0–36.0)
MCV: 92.4 fL (ref 80.0–100.0)
Platelets: 186 10*3/uL (ref 150–400)
RBC: 3.84 MIL/uL — ABNORMAL LOW (ref 3.87–5.11)
RDW: 13.3 % (ref 11.5–15.5)
WBC: 7.6 10*3/uL (ref 4.0–10.5)
nRBC: 0 % (ref 0.0–0.2)

## 2020-05-01 MED ORDER — POTASSIUM CHLORIDE CRYS ER 20 MEQ PO TBCR
40.0000 meq | EXTENDED_RELEASE_TABLET | Freq: Once | ORAL | Status: AC
  Administered 2020-05-01: 40 meq via ORAL
  Filled 2020-05-01: qty 2

## 2020-05-01 NOTE — Progress Notes (Signed)
Discharge Note: Reviewed discharge instructions with pt. Pt verbalized understanding.  Discharged to home with personal belongings. Iv cath intact upon removal.  Staff wheeled pt out. Pt transported to home by family transport.

## 2020-05-01 NOTE — Procedures (Signed)
  PROCEDURE: EEG with video routine 22 minutes study  DATES OF TEST: 05/01/20   REASON FOR TEST: Acute encephalopathy  AED/Sedative MEDICATIONS:none  TECHNIQUE: This is an 18 channel digital EEG recording using the standard international 10/20 system of electrode placement with one channel EKG recording. Pt was awake and drowsy during this recording.  FINDINGS:  Background rhythm: symmetric 4-8 Hz,   Amplitude : normal and high (> or equal to 150 microvolts) Continuity: continuous Breach effect:  NO Variability: NO Reactivity: NO Rhythmic delta activity: NO Periodic discharges: NO Sporadic epileptiform discharges: NO Electrographic/electroclinical seizures:NO  Limited EKG reveals no abnormalities.    IMPRESSION:  This is an abnormal study due to - 1. Mild to moderate Generalized slowing is a non-specific finding consistent with a generalized disturbance of cerebral functioning including toxic, metabolic, or structural abnormalities that are multi-focal or diffuse. 2. No definite epileptiform discharges or electrographic seizures noted.

## 2020-05-01 NOTE — Progress Notes (Signed)
EEG completed, results pending. 

## 2020-05-01 NOTE — Discharge Summary (Signed)
Physician Discharge Summary  Angela Daniel:[id number] DOB: 21-Aug-1955 DOA: 04/30/2020  PCP: [facility], [place], NP  Admit date: 04/30/2020 Discharge date: 05/01/2020  Admitted From: home Disposition:  home  Recommendations for Outpatient Follow-up:  1. Follow up with PCP in 1-2 weeks 2. Please obtain BMP/CBC in one week 3. Please follow up at pain clinic as routinely scheduled 4. Consider adjustments to medication regimen given multiple sedating medications and pt presented for AMS and being found unresponsive. 5. Consider outpatient neurology referral if any ongoing concerns for seizure.  EEG was negative for signs of seizure activity.  Home Health: No  Equipment/Devices: none  Discharge Condition: stable  CODE STATUS: full  Diet recommendation: Heart Healthy / Carb Modified      Discharge Diagnoses: Principal Problem:   Acute metabolic encephalopathy Active Problems:   COPD (chronic obstructive pulmonary disease) (HCC)   Hypertension   GERD (gastroesophageal reflux disease)   Fibromyalgia   Diabetes mellitus without complication (HCC)   Congestive heart failure (HCC)   HLD (hyperlipidemia)   Depression with anxiety    Summary of HPI and Hospital Course:  Angela Daniel is a 65 y.o. female with medical history significant of HTN, HLD, DM, COPD, OSA, GERD, depression and anxiety, CHF, tobacco abuse, scoliosis, fibromyalgia on subutex, who presents with AMS...  Patient has AMS, and is unable to provide accurate medical history, therefore, most of the history is obtained by discussing the case with ED physician, per EMS report, and with the nursing staff.  pt was last known normal at about 7 PM yesterday.  Patient was found to be unresponsive at about 2 AM in the early morning, but with breathing. Patient woke up after few seconds and was disorientated. During admitting encounter per Dr. Donna Bernard: "pt is somnolent, but arousable.  She knows her own name and knows  that she is in hospital, but confused about time.  She moves all extremities.  No facial droop or slurred speech.  No active respiratory distress, cough, nausea, vomiting, diarrhea noted.  Patient does not seem to have chest pain or abdominal pain.  Not sure if patient has symptoms of UTI.  Per report, her daughter reported that patient has been on Xanax for a long time.  Pt had hx of seizure few years ago which might be related to Xanax use. Patient is on suboxone, but also takes percocet, xanax and baclofen. Her daugher suspects that pt may have seizure, but sounds like that her daughter did not witness a seizure.  Daughter does not think patient has any chest pain, shortness breath, nausea, vomiting, diarrhea or abdominal pain.  Addendum: later on, her daughter called me back.  Basically daughter confirmed the story as reported above. Daughter states that patient had remote seizure many years ago which might be related to Xanax use.  Currently patient is taking as needed Percocet, Suboxone, Xanax, baclofen and Neurontin for pain.  ED Course: pt was found to have WBC 8.8, INR 1.0, UDS negative, pending urinalysis, pending COVID-19 PCR, electrolytes renal function okay, temperature normal, blood pressure 154/81, heart rate 72, RR 28, 17, oxygen saturation 96% on room air, VBG with pH 7.39, CO2 55.  CT head negative for acute intracranial abnormalities.  Pending chest x-ray.  Patient is placed on MedSurg bed for observation."   Acute metabolic encephalopathy: Etiology is not clear.  CT head negative for acute intracranial abnormalities.  No focal deficit on physical examination. Potential differential diagnosis is polypharmacy (UDS was negative).  Seizure is another differential diagnosis -placed on seizure precautions, no seizure-like activity was seen.   Of note, Percocet and baclofen are not on patient's medication list, but patient's daughter states that patient is taking those 2 medications as  needed. All sedating medications were held including Xanax, Abilify, Subuex, Neurontin, tizanidine, Percocet, baclofen   EEG - IMPRESSION:  This is an abnormal study due to - 1. Mild to moderate Generalized slowing is a non-specific finding consistent with a generalized disturbance of cerebral functioning including toxic, metabolic, or structural abnormalities that are multi-focal or diffuse. 2. No definite epileptiform discharges or electrographic seizures noted.  Patient's mental status returned to baseline with time and holding of medications.  EEG was negative.  Patient was advised, if suspicion for seizures, would advise seeing neurology.  Pt reported seizure episode she'd had in the past was about 30 years ago.  Pt denied regular consumption of alcohol or recently stopping alcohol or benzodiazepine use abruptly.  Pt counseled regarding use of multiple sedating medications, and advise follow up with outpatient provider/s to consider reduction in medications as appropriate.     Depression with anxiety: Initially held Abilify, Xanax.  Resumed once pt's mental status recovered.  COPD Stable.  Continue home regimen.  Hypertension Continue lisinopril, metoprolol, metoprolol  GERD - Protonix  Fibromyalgia - Initially held home baclofen, Neurontin, Subutex, Percocet, tizanidine.  Resumed once mental status improved.  Diabetes mellitus without complication  We held Metformin.  Covered with sliding scale insulin during admission.  History of congestive heart failure ([place]): No prior echo available, unknown if diastolic or systolic dysfunction.  Given long history of hypertension, patient may have diastolic CHF.  Stable without edema or JVD, respiratory distress.    HLD: on Crestor      Discharge Instructions   Discharge Instructions    Call MD for:  extreme fatigue   Complete by: As directed    Call MD for:  persistant dizziness or light-headedness   Complete by: As  directed    Call MD for:  persistant nausea and vomiting   Complete by: As directed    Call MD for:  severe uncontrolled pain   Complete by: As directed    Call MD for:  temperature >100.4   Complete by: As directed    Diet - low sodium heart healthy   Complete by: As directed    Discharge instructions   Complete by: As directed    If your EEG (test that looks for seizure activity in the brain) is abnormal and shows signs of seizure activity, I will call you and get you set up to see neurology.  If you do not get a call from me, then you can assume the EEG was normal.  I suspect medications probably caused your altered mental status.   Would recommend following up with your outpatient providers to consider adjusting medications to limit sedating side effects adding up.  I did not change your outpatient regimen of medications at this time.   Increase activity slowly   Complete by: As directed      Allergies as of 05/01/2020      Reactions   Tramadol Hives   Tylenol [acetaminophen] Nausea And Vomiting   Vicodin [hydrocodone-acetaminophen] Hives      Medication List    STOP taking these medications   meloxicam 15 MG tablet Commonly known as: MOBIC   methocarbamol 500 MG tablet Commonly known as: Robaxin     TAKE these medications   alprazolam  2 MG tablet Commonly known as: XANAX Take 2 mg by mouth 3 (three) times daily.   amLODipine 2.5 MG tablet Commonly known as: NORVASC Take 2.5 mg by mouth daily.   amphetamine-dextroamphetamine 30 MG tablet Commonly known as: ADDERALL Take 30 mg by mouth 2 (two) times daily. Morning and evening   amphetamine-dextroamphetamine 20 MG tablet Commonly known as: ADDERALL Take 20 mg by mouth daily at 12 noon.   ARIPiprazole 15 MG tablet Commonly known as: ABILIFY Take 7.5 mg by mouth daily.   aspirin EC 81 MG tablet Take 81 mg by mouth daily.   buprenorphine 8 MG Subl SL tablet Commonly known as: SUBUTEX Place 8 mg under the  tongue 3 (three) times daily.   cyanocobalamin 2000 MCG tablet Take 2,000 mcg by mouth daily.   diclofenac 50 MG EC tablet Commonly known as: VOLTAREN Take 50 mg by mouth 2 (two) times daily as needed for mild pain.   esomeprazole 40 MG capsule Commonly known as: NEXIUM Take 40 mg by mouth daily.   fluticasone 50 MCG/ACT nasal spray Commonly known as: FLONASE Place 1 spray into both nostrils at bedtime.   Fluticasone-Salmeterol 250-50 MCG/DOSE Aepb Commonly known as: ADVAIR Inhale 1 puff into the lungs 2 (two) times daily.   gabapentin 800 MG tablet Commonly known as: NEURONTIN Take 800 mg by mouth 4 (four) times daily.   lisinopril 10 MG tablet Commonly known as: ZESTRIL Take 10 mg by mouth daily.   metFORMIN 500 MG tablet Commonly known as: GLUCOPHAGE Take 500 mg by mouth 2 (two) times daily with a meal.   metoprolol succinate 50 MG 24 hr tablet Commonly known as: TOPROL-XL Take 50 mg by mouth daily.   Narcan 4 MG/0.1ML Liqd nasal spray kit Generic drug: naloxone Place 1 spray into the nose as directed.   pantoprazole 20 MG tablet Commonly known as: PROTONIX Take 20 mg by mouth daily.   rosuvastatin 20 MG tablet Commonly known as: CRESTOR Take 20 mg by mouth daily.   tiZANidine 4 MG tablet Commonly known as: ZANAFLEX Take 4 mg by mouth 3 (three) times daily as needed for muscle spasms.   Vitamin D3 50 MCG (2000 UT) Tabs Take 2,000 mg by mouth daily.   vitamin E 200 UNIT capsule Take 200 Units by mouth daily.   zinc gluconate 50 MG tablet Take 50 mg by mouth daily.       Allergies  Allergen Reactions  . Tramadol Hives  . Tylenol [Acetaminophen] Nausea And Vomiting  . Vicodin [Hydrocodone-Acetaminophen] Hives     If you experience worsening of your admission symptoms, develop shortness of breath, life threatening emergency, suicidal or homicidal thoughts you must seek medical attention immediately by calling 911 or calling your MD immediately   if symptoms less severe.    Please note   You were cared for by a hospitalist during your hospital stay. If you have any questions about your discharge medications or the care you received while you were in the hospital after you are discharged, you can call the unit and asked to speak with the hospitalist on call if the hospitalist that took care of you is not available. Once you are discharged, your primary care physician will handle any further medical issues. Please note that NO REFILLS for any discharge medications will be authorized once you are discharged, as it is imperative that you return to your primary care physician (or establish a relationship with a primary care physician if you do  not have one) for your aftercare needs so that they can reassess your need for medications and monitor your lab values.   Consultations: None    Procedures/Studies: CT HEAD WO CONTRAST  Result Date: 04/30/2020 CLINICAL DATA:  65 year old female with history of delirium. EXAM: CT HEAD WITHOUT CONTRAST TECHNIQUE: Contiguous axial images were obtained from the base of the skull through the vertex without intravenous contrast. COMPARISON:  Head CT 03/16/2020. FINDINGS: Brain: Patchy and confluent areas of decreased attenuation are noted throughout the deep and periventricular white matter of the cerebral hemispheres bilaterally, compatible with chronic microvascular ischemic disease. Well-defined foci of low attenuation are noted in the basal ganglia bilaterally, compatible with old lacunar infarcts. No evidence of acute infarction, hemorrhage, hydrocephalus, extra-axial collection or mass lesion/mass effect. Vascular: No hyperdense vessel or unexpected calcification. Skull: Normal. Negative for fracture or focal lesion. Sinuses/Orbits: No acute finding. Other: None. IMPRESSION: 1. No acute intracranial abnormalities. 2. 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 Electronically Signed   By: Vinnie Langton M.D.   On: 04/30/2020 06:56   DG Chest Portable 1 View  Result Date: 04/30/2020 CLINICAL DATA:  Altered mental status EXAM: PORTABLE CHEST 1 VIEW COMPARISON:  May 04, 2019 chest radiograph and chest CT FINDINGS: There is scarring in the left base region. There is no edema or airspace opacity. Heart is upper normal in size with pulmonary vascularity normal. No adenopathy. Findings indicative of previous kyphoplasty in the midthoracic region. IMPRESSION: Apparent scarring left base. No edema or airspace opacity. Stable cardiac silhouette. Electronically Signed   By: Lowella Grip III M.D.   On: 04/30/2020 08:24       Subjective: Pt feels well today.  Feels back to normal.  Denied recently taking new medications or more of any of her medications.  Reports seizure her daughter had reported to admitting doctor occurred about 30 years ago, she could not recall the cause but denied any seizure-like activity since then.  She denied having any mouth pain or injury, and denied loss of bowel or bladder control with episode leading to this admission.  No other acute complaints.    Discharge Exam: Vitals:   05/01/20 0441 05/01/20 0758  BP: (!) 153/72 (!) 143/78  Pulse: 77 74  Resp: 18 18  Temp: 98.3 F (36.8 C) 98.8 F (37.1 C)  SpO2: 100% 98%   Vitals:   04/30/20 1628 04/30/20 2011 05/01/20 0441 05/01/20 0758  BP: (!) 149/60 (!) 154/82 (!) 153/72 (!) 143/78  Pulse: 80 98 77 74  Resp: 14 20 18 18   Temp: 98.9 F (37.2 C) 98.5 F (36.9 C) 98.3 F (36.8 C) 98.8 F (37.1 C)  TempSrc:      SpO2: 95% 97% 100% 98%  Weight:      Height:        General: Pt is alert, awake, not in acute distress Cardiovascular: RRR, S1/S2 +, no rubs, no gallops Respiratory: CTA bilaterally, no wheezing, no rhonchi Abdominal: Soft, NT, ND, bowel sounds + Extremities: no edema, no cyanosis    The  results of significant diagnostics from this hospitalization (including imaging, microbiology, ancillary and laboratory) are listed below for reference.     Microbiology: Recent Results (from the past 240 hour(s))  SARS CORONAVIRUS 2 (TAT 6-24 HRS) Nasopharyngeal Urine, Clean Catch     Status: None   Collection Time: 04/30/20  6:23 AM   Specimen: Urine, Clean  Catch; Nasopharyngeal  Result Value Ref Range Status   SARS Coronavirus 2 NEGATIVE NEGATIVE Final    Comment: (NOTE) SARS-CoV-2 target nucleic acids are NOT DETECTED.  The SARS-CoV-2 RNA is generally detectable in upper and lower respiratory specimens during the acute phase of infection. Negative results do not preclude SARS-CoV-2 infection, do not rule out co-infections with other pathogens, and should not be used as the sole basis for treatment or other patient management decisions. Negative results must be combined with clinical observations, patient history, and epidemiological information. The expected result is Negative.  Fact Sheet for Patients: SugarRoll.be  Fact Sheet for Healthcare Providers: https://www.woods-mathews.com/  This test is not yet approved or cleared by the [place] FDA and  has been authorized for detection and/or diagnosis of SARS-CoV-2 by FDA under an Emergency Use Authorization (EUA). This EUA will remain  in effect (meaning this test can be used) for the duration of the COVID-19 declaration under Se ction 564(b)(1) of the Act, 21 U.S.C. section 360bbb-3(b)(1), unless the authorization is terminated or revoked sooner.  Performed at [facility], [place] [address], [place], [place] [address]      Labs: BNP (last 3 results) Recent Labs    04/30/20 0605  BNP 818.2*   Basic Metabolic Panel: Recent Labs  Lab 04/30/20 0605 05/01/20 0550  NA 140 138  K 3.6 3.2*  CL 104 105  CO2 30 24  GLUCOSE 102* 99  BUN 13 14  CREATININE 0.79 0.64   CALCIUM 9.2 8.5*  MG 1.7  --   PHOS 2.6  --    Liver Function Tests: Recent Labs  Lab 04/30/20 0605  AST 15  ALT 10  ALKPHOS 53  BILITOT 0.6  PROT 7.4  ALBUMIN 4.1   No results for input(s): LIPASE, AMYLASE in the last 168 hours. Recent Labs  Lab 04/30/20 0819  AMMONIA 26   CBC: Recent Labs  Lab 04/30/20 0605 05/01/20 0550  WBC 8.8 7.6  HGB 13.6 11.8*  HCT 39.8 35.5*  MCV 94.3 92.4  PLT 238 186   Cardiac Enzymes: No results for input(s): CKTOTAL, CKMB, CKMBINDEX, TROPONINI in the last 168 hours. BNP: Invalid input(s): POCBNP CBG: Recent Labs  Lab 04/30/20 0604 04/30/20 1633 04/30/20 2148 05/01/20 0757 05/01/20 1152  GLUCAP 96 100* 161* 101* 101*   D-Dimer No results for input(s): DDIMER in the last 72 hours. Hgb A1c Recent Labs    05/01/20 0550  HGBA1C 6.0*   Lipid Profile No results for input(s): CHOL, HDL, LDLCALC, TRIG, CHOLHDL, LDLDIRECT in the last 72 hours. Thyroid function studies Recent Labs    04/30/20 0819  TSH 3.728   Anemia work up No results for input(s): VITAMINB12, FOLATE, FERRITIN, TIBC, IRON, RETICCTPCT in the last 72 hours. Urinalysis    Component Value Date/Time   COLORURINE STRAW (A) 04/30/2020 0554   APPEARANCEUR CLEAR (A) 04/30/2020 0554   APPEARANCEUR Cloudy (A) 10/22/2019 1025   LABSPEC 1.012 04/30/2020 0554   PHURINE 9.0 (H) 04/30/2020 0554   GLUCOSEU NEGATIVE 04/30/2020 0554   HGBUR NEGATIVE 04/30/2020 0554   BILIRUBINUR NEGATIVE 04/30/2020 0554   BILIRUBINUR Negative 10/22/2019 1025   KETONESUR NEGATIVE 04/30/2020 0554   PROTEINUR NEGATIVE 04/30/2020 0554   NITRITE NEGATIVE 04/30/2020 0554   LEUKOCYTESUR NEGATIVE 04/30/2020 0554   Sepsis Labs Invalid input(s): PROCALCITONIN,  WBC,  LACTICIDVEN Microbiology Recent Results (from the past 240 hour(s))  SARS CORONAVIRUS 2 (TAT 6-24 HRS) Nasopharyngeal Urine, Clean Catch     Status: None  Collection Time: 04/30/20  6:23 AM   Specimen: Urine, Clean Catch;  Nasopharyngeal  Result Value Ref Range Status   SARS Coronavirus 2 NEGATIVE NEGATIVE Final    Comment: (NOTE) SARS-CoV-2 target nucleic acids are NOT DETECTED.  The SARS-CoV-2 RNA is generally detectable in upper and lower respiratory specimens during the acute phase of infection. Negative results do not preclude SARS-CoV-2 infection, do not rule out co-infections with other pathogens, and should not be used as the sole basis for treatment or other patient management decisions. Negative results must be combined with clinical observations, patient history, and epidemiological information. The expected result is Negative.  Fact Sheet for Patients: SugarRoll.be  Fact Sheet for Healthcare Providers: https://www.woods-mathews.com/  This test is not yet approved or cleared by the [place] FDA and  has been authorized for detection and/or diagnosis of SARS-CoV-2 by FDA under an Emergency Use Authorization (EUA). This EUA will remain  in effect (meaning this test can be used) for the duration of the COVID-19 declaration under Se ction 564(b)(1) of the Act, 21 U.S.C. section 360bbb-3(b)(1), unless the authorization is terminated or revoked sooner.  Performed at [facility], [place] [address] [place], [place] [address]      Time coordinating discharge: Over 30 minutes  SIGNED:   Ezekiel Slocumb, DO [facility] 05/01/2020, 3:40 PM   If 7PM-7AM, please contact night-coverage [email or web address]

## 2020-05-01 NOTE — Progress Notes (Signed)
PT Cancellation Note  Patient Details Name: Faiga Stones MRN: [id number] DOB: February 11, 1955   Cancelled Treatment:    Reason Eval/Treat Not Completed: PT screened, no needs identified, will sign off.  Per nursing pt ambulated with nursing in the halls and no skilled PT needs identified.  Will complete PT orders at this time but will reassess pt pending a change in status upon receipt of new PT orders.    Ovidio Hanger PT, DPT 05/01/20, 3:15 PM

## 2020-05-02 LAB — GLUCOSE, CAPILLARY
Glucose-Capillary: 108 mg/dL — ABNORMAL HIGH (ref 70–99)
Glucose-Capillary: 102 mg/dL — ABNORMAL HIGH (ref 70–99)

## 2020-05-09 LAB — BLOOD GAS, VENOUS
Acid-Base Excess: 6.7 mmol/L — ABNORMAL HIGH (ref 0.0–2.0)
Bicarbonate: 33.3 mmol/L — ABNORMAL HIGH (ref 20.0–28.0)
O2 Saturation: 36.6 %
Patient temperature: 37
pCO2, Ven: 55 mmHg (ref 44.0–60.0)
pH, Ven: 7.39 (ref 7.250–7.430)

## 2020-06-15 ENCOUNTER — Emergency Department: Payer: Medicare Other

## 2020-06-15 ENCOUNTER — Other Ambulatory Visit: Payer: Self-pay

## 2020-06-15 ENCOUNTER — Observation Stay: Payer: Medicare Other

## 2020-06-15 ENCOUNTER — Inpatient Hospital Stay
Admission: EM | Admit: 2020-06-15 | Discharge: 2020-06-23 | DRG: 100 | Disposition: A | Discharge status: Home Health | Payer: Medicare Other | Attending: Internal Medicine | Admitting: Internal Medicine

## 2020-06-15 DIAGNOSIS — F32A Depression, unspecified: Secondary | ICD-10-CM | POA: Diagnosis present

## 2020-06-15 DIAGNOSIS — E872 Acidosis, unspecified: Secondary | ICD-10-CM | POA: Diagnosis present

## 2020-06-15 DIAGNOSIS — G928 Other toxic encephalopathy: Secondary | ICD-10-CM | POA: Diagnosis present

## 2020-06-15 DIAGNOSIS — T43625A Adverse effect of amphetamines, initial encounter: Secondary | ICD-10-CM | POA: Diagnosis present

## 2020-06-15 DIAGNOSIS — F17213 Nicotine dependence, cigarettes, with withdrawal: Secondary | ICD-10-CM

## 2020-06-15 DIAGNOSIS — Z20822 Contact with and (suspected) exposure to covid-19: Secondary | ICD-10-CM | POA: Diagnosis present

## 2020-06-15 DIAGNOSIS — Z801 Family history of malignant neoplasm of trachea, bronchus and lung: Secondary | ICD-10-CM

## 2020-06-15 DIAGNOSIS — Z833 Family history of diabetes mellitus: Secondary | ICD-10-CM

## 2020-06-15 DIAGNOSIS — R569 Unspecified convulsions: Secondary | ICD-10-CM

## 2020-06-15 DIAGNOSIS — E119 Type 2 diabetes mellitus without complications: Secondary | ICD-10-CM | POA: Diagnosis not present

## 2020-06-15 DIAGNOSIS — G4733 Obstructive sleep apnea (adult) (pediatric): Secondary | ICD-10-CM | POA: Diagnosis present

## 2020-06-15 DIAGNOSIS — G4089 Other seizures: Secondary | ICD-10-CM | POA: Diagnosis not present

## 2020-06-15 DIAGNOSIS — Z7951 Long term (current) use of inhaled steroids: Secondary | ICD-10-CM

## 2020-06-15 DIAGNOSIS — R7401 Elevation of levels of liver transaminase levels: Secondary | ICD-10-CM | POA: Diagnosis present

## 2020-06-15 DIAGNOSIS — I152 Hypertension secondary to endocrine disorders: Secondary | ICD-10-CM | POA: Diagnosis present

## 2020-06-15 DIAGNOSIS — R4182 Altered mental status, unspecified: Secondary | ICD-10-CM | POA: Diagnosis present

## 2020-06-15 DIAGNOSIS — J189 Pneumonia, unspecified organism: Secondary | ICD-10-CM | POA: Diagnosis present

## 2020-06-15 DIAGNOSIS — T43595A Adverse effect of other antipsychotics and neuroleptics, initial encounter: Secondary | ICD-10-CM | POA: Diagnosis present

## 2020-06-15 DIAGNOSIS — Z7982 Long term (current) use of aspirin: Secondary | ICD-10-CM

## 2020-06-15 DIAGNOSIS — F17210 Nicotine dependence, cigarettes, uncomplicated: Secondary | ICD-10-CM | POA: Diagnosis present

## 2020-06-15 DIAGNOSIS — I10 Essential (primary) hypertension: Secondary | ICD-10-CM | POA: Diagnosis present

## 2020-06-15 DIAGNOSIS — Z91138 Patient's unintentional underdosing of medication regimen for other reason: Secondary | ICD-10-CM

## 2020-06-15 DIAGNOSIS — G40909 Epilepsy, unspecified, not intractable, without status epilepticus: Secondary | ICD-10-CM

## 2020-06-15 DIAGNOSIS — Z79899 Other long term (current) drug therapy: Secondary | ICD-10-CM | POA: Diagnosis not present

## 2020-06-15 DIAGNOSIS — Z823 Family history of stroke: Secondary | ICD-10-CM

## 2020-06-15 DIAGNOSIS — Z886 Allergy status to analgesic agent status: Secondary | ICD-10-CM

## 2020-06-15 DIAGNOSIS — F13239 Sedative, hypnotic or anxiolytic dependence with withdrawal, unspecified: Secondary | ICD-10-CM | POA: Diagnosis present

## 2020-06-15 DIAGNOSIS — K219 Gastro-esophageal reflux disease without esophagitis: Secondary | ICD-10-CM | POA: Diagnosis present

## 2020-06-15 DIAGNOSIS — Z8249 Family history of ischemic heart disease and other diseases of the circulatory system: Secondary | ICD-10-CM

## 2020-06-15 DIAGNOSIS — E1159 Type 2 diabetes mellitus with other circulatory complications: Secondary | ICD-10-CM | POA: Diagnosis present

## 2020-06-15 DIAGNOSIS — M797 Fibromyalgia: Secondary | ICD-10-CM | POA: Diagnosis present

## 2020-06-15 DIAGNOSIS — T424X6A Underdosing of benzodiazepines, initial encounter: Secondary | ICD-10-CM | POA: Diagnosis present

## 2020-06-15 DIAGNOSIS — Z7984 Long term (current) use of oral hypoglycemic drugs: Secondary | ICD-10-CM

## 2020-06-15 DIAGNOSIS — F419 Anxiety disorder, unspecified: Secondary | ICD-10-CM | POA: Diagnosis present

## 2020-06-15 DIAGNOSIS — Z825 Family history of asthma and other chronic lower respiratory diseases: Secondary | ICD-10-CM

## 2020-06-15 DIAGNOSIS — G894 Chronic pain syndrome: Secondary | ICD-10-CM | POA: Diagnosis present

## 2020-06-15 DIAGNOSIS — J440 Chronic obstructive pulmonary disease with acute lower respiratory infection: Secondary | ICD-10-CM | POA: Diagnosis present

## 2020-06-15 DIAGNOSIS — F418 Other specified anxiety disorders: Secondary | ICD-10-CM | POA: Diagnosis not present

## 2020-06-15 DIAGNOSIS — E785 Hyperlipidemia, unspecified: Secondary | ICD-10-CM | POA: Diagnosis present

## 2020-06-15 DIAGNOSIS — Z885 Allergy status to narcotic agent status: Secondary | ICD-10-CM

## 2020-06-15 DIAGNOSIS — F17200 Nicotine dependence, unspecified, uncomplicated: Secondary | ICD-10-CM | POA: Diagnosis present

## 2020-06-15 DIAGNOSIS — T426X5A Adverse effect of other antiepileptic and sedative-hypnotic drugs, initial encounter: Secondary | ICD-10-CM | POA: Diagnosis present

## 2020-06-15 DIAGNOSIS — T40495A Adverse effect of other synthetic narcotics, initial encounter: Secondary | ICD-10-CM | POA: Diagnosis present

## 2020-06-15 DIAGNOSIS — F1120 Opioid dependence, uncomplicated: Secondary | ICD-10-CM | POA: Diagnosis present

## 2020-06-15 LAB — URINALYSIS, COMPLETE (UACMP) WITH MICROSCOPIC
Bilirubin Urine: NEGATIVE
Glucose, UA: NEGATIVE mg/dL
Hgb urine dipstick: NEGATIVE
Ketones, ur: NEGATIVE mg/dL
Leukocytes,Ua: NEGATIVE
Nitrite: NEGATIVE
Protein, ur: 30 mg/dL — AB
Specific Gravity, Urine: 1.017 (ref 1.005–1.030)
pH: 6 (ref 5.0–8.0)

## 2020-06-15 LAB — COMPREHENSIVE METABOLIC PANEL
ALT: 9 U/L (ref 0–44)
AST: 20 U/L (ref 15–41)
Albumin: 3.6 g/dL (ref 3.5–5.0)
Alkaline Phosphatase: 61 U/L (ref 38–126)
Anion gap: 14 (ref 5–15)
BUN: 17 mg/dL (ref 8–23)
CO2: 29 mmol/L (ref 22–32)
Calcium: 8.9 mg/dL (ref 8.9–10.3)
Chloride: 94 mmol/L — ABNORMAL LOW (ref 98–111)
Creatinine, Ser: 1.04 mg/dL — ABNORMAL HIGH (ref 0.44–1.00)
GFR, Estimated: 59 mL/min — ABNORMAL LOW (ref >60)
Glucose, Bld: 130 mg/dL — ABNORMAL HIGH (ref 70–99)
Potassium: 3.3 mmol/L — ABNORMAL LOW (ref 3.5–5.1)
Sodium: 137 mmol/L (ref 135–145)
Total Bilirubin: 0.4 mg/dL (ref 0.3–1.2)
Total Protein: 7.1 g/dL (ref 6.5–8.1)

## 2020-06-15 LAB — CBC WITH DIFFERENTIAL/PLATELET
Abs Immature Granulocytes: 0.03 10*3/uL (ref 0.00–0.07)
Basophils Absolute: 0 10*3/uL (ref 0.0–0.1)
Basophils Relative: 0 %
Eosinophils Absolute: 0.1 10*3/uL (ref 0.0–0.5)
Eosinophils Relative: 1 %
HCT: 37.4 % (ref 36.0–46.0)
Hemoglobin: 12.6 g/dL (ref 12.0–15.0)
Immature Granulocytes: 0 %
Lymphocytes Relative: 7 %
Lymphs Abs: 0.7 10*3/uL (ref 0.7–4.0)
MCH: 31.2 pg (ref 26.0–34.0)
MCHC: 33.7 g/dL (ref 30.0–36.0)
MCV: 92.6 fL (ref 80.0–100.0)
Monocytes Absolute: 0.5 10*3/uL (ref 0.1–1.0)
Monocytes Relative: 5 %
Neutro Abs: 7.7 10*3/uL (ref 1.7–7.7)
Neutrophils Relative %: 87 %
Platelets: 210 10*3/uL (ref 150–400)
RBC: 4.04 MIL/uL (ref 3.87–5.11)
RDW: 12.7 % (ref 11.5–15.5)
WBC: 9 10*3/uL (ref 4.0–10.5)
nRBC: 0 % (ref 0.0–0.2)

## 2020-06-15 LAB — URINE DRUG SCREEN, QUALITATIVE (ARMC ONLY)
Amphetamines, Ur Screen: NOT DETECTED
Barbiturates, Ur Screen: NOT DETECTED
Benzodiazepine, Ur Scrn: NOT DETECTED
Cannabinoid 50 Ng, Ur ~~LOC~~: NOT DETECTED
Cocaine Metabolite,Ur ~~LOC~~: NOT DETECTED
MDMA (Ecstasy)Ur Screen: NOT DETECTED
Methadone Scn, Ur: NOT DETECTED
Opiate, Ur Screen: NOT DETECTED
Phencyclidine (PCP) Ur S: NOT DETECTED
Tricyclic, Ur Screen: NOT DETECTED

## 2020-06-15 LAB — CK: Total CK: 775 U/L — ABNORMAL HIGH (ref 38–234)

## 2020-06-15 LAB — LACTIC ACID, PLASMA
Lactic Acid, Venous: 2 mmol/L (ref 0.5–1.9)
Lactic Acid, Venous: 1.3 mmol/L (ref 0.5–1.9)

## 2020-06-15 LAB — RESP PANEL BY RT-PCR (FLU A&B, COVID) ARPGX2
Influenza A by PCR: NEGATIVE
Influenza B by PCR: NEGATIVE
SARS Coronavirus 2 by RT PCR: NEGATIVE

## 2020-06-15 LAB — TROPONIN I (HIGH SENSITIVITY)
Troponin I (High Sensitivity): 16 ng/L (ref <18)
Troponin I (High Sensitivity): 17 ng/L (ref <18)

## 2020-06-15 LAB — GLUCOSE, CAPILLARY: Glucose-Capillary: 121 mg/dL — ABNORMAL HIGH (ref 70–99)

## 2020-06-15 LAB — MAGNESIUM: Magnesium: 1.6 mg/dL — ABNORMAL LOW (ref 1.7–2.4)

## 2020-06-15 LAB — CBG MONITORING, ED: Glucose-Capillary: 140 mg/dL — ABNORMAL HIGH (ref 70–99)

## 2020-06-15 MED ORDER — VITAMIN E 45 MG (100 UNIT) PO CAPS
400.0000 [IU] | ORAL_CAPSULE | Freq: Every day | ORAL | Status: DC
  Administered 2020-06-16 – 2020-06-23 (×6): 400 [IU] via ORAL
  Filled 2020-06-15 (×9): qty 4

## 2020-06-15 MED ORDER — AMLODIPINE BESYLATE 5 MG PO TABS: 2.5000 mg | ORAL_TABLET | Freq: Every day | ORAL | Status: DC

## 2020-06-15 MED ORDER — FUROSEMIDE 20 MG PO TABS
20.0000 mg | ORAL_TABLET | Freq: Every morning | ORAL | Status: DC
  Administered 2020-06-16: 10:00:00 20 mg via ORAL
  Filled 2020-06-15: qty 1

## 2020-06-15 MED ORDER — LISINOPRIL 5 MG PO TABS
10.0000 mg | ORAL_TABLET | Freq: Every day | ORAL | Status: DC
  Administered 2020-06-16: 10 mg via ORAL
  Filled 2020-06-15 (×2): qty 1

## 2020-06-15 MED ORDER — LORAZEPAM 2 MG/ML IJ SOLN: 2.0000 mg | INTRAMUSCULAR | Status: DC | PRN

## 2020-06-15 MED ORDER — ONDANSETRON HCL 4 MG PO TABS: 4.0000 mg | ORAL_TABLET | Freq: Four times a day (QID) | ORAL | Status: DC | PRN

## 2020-06-15 MED ORDER — MOMETASONE FURO-FORMOTEROL FUM 200-5 MCG/ACT IN AERO
2.0000 | INHALATION_SPRAY | Freq: Two times a day (BID) | RESPIRATORY_TRACT | Status: DC
  Administered 2020-06-15 – 2020-06-23 (×12): 2 via RESPIRATORY_TRACT
  Filled 2020-06-15 (×3): qty 8.8

## 2020-06-15 MED ORDER — FLUTICASONE PROPIONATE 50 MCG/ACT NA SUSP
1.0000 | Freq: Every day | NASAL | Status: DC
  Administered 2020-06-15 – 2020-06-22 (×6): 1 via NASAL
  Filled 2020-06-15 (×2): qty 16

## 2020-06-15 MED ORDER — AMPHETAMINE-DEXTROAMPHETAMINE 10 MG PO TABS
30.0000 mg | ORAL_TABLET | Freq: Two times a day (BID) | ORAL | Status: DC
  Administered 2020-06-15 – 2020-06-16 (×2): 30 mg via ORAL
  Filled 2020-06-15 (×2): qty 3

## 2020-06-15 MED ORDER — POTASSIUM CHLORIDE IN NACL 40-0.9 MEQ/L-% IV SOLN
INTRAVENOUS | Status: DC
  Filled 2020-06-15 (×8): qty 1000

## 2020-06-15 MED ORDER — GABAPENTIN 300 MG PO CAPS
800.0000 mg | ORAL_CAPSULE | Freq: Four times a day (QID) | ORAL | Status: DC
  Administered 2020-06-15 – 2020-06-16 (×3): 800 mg via ORAL
  Filled 2020-06-15 (×3): qty 2

## 2020-06-15 MED ORDER — ENOXAPARIN SODIUM 40 MG/0.4ML IJ SOSY
40.0000 mg | PREFILLED_SYRINGE | INTRAMUSCULAR | Status: DC
  Administered 2020-06-15 – 2020-06-22 (×8): 40 mg via SUBCUTANEOUS
  Filled 2020-06-15 (×8): qty 0.4

## 2020-06-15 MED ORDER — BUPRENORPHINE HCL 8 MG SL SUBL
8.0000 mg | SUBLINGUAL_TABLET | Freq: Three times a day (TID) | SUBLINGUAL | Status: DC
  Administered 2020-06-15 – 2020-06-23 (×17): 8 mg via SUBLINGUAL
  Filled 2020-06-15 (×9): qty 4
  Filled 2020-06-15: qty 1
  Filled 2020-06-15 (×3): qty 4
  Filled 2020-06-15: qty 1
  Filled 2020-06-15 (×2): qty 4
  Filled 2020-06-15 (×2): qty 1

## 2020-06-15 MED ORDER — SODIUM CHLORIDE 0.9 % IV SOLN
20.0000 mg/kg | Freq: Once | INTRAVENOUS | Status: AC
  Administered 2020-06-15: 1260 mg via INTRAVENOUS
  Filled 2020-06-15: qty 12.6

## 2020-06-15 MED ORDER — LACTATED RINGERS IV BOLUS
1000.0000 mL | Freq: Once | INTRAVENOUS | Status: AC
  Administered 2020-06-15: 1000 mL via INTRAVENOUS

## 2020-06-15 MED ORDER — ARIPIPRAZOLE 15 MG PO TABS
7.5000 mg | ORAL_TABLET | Freq: Every day | ORAL | Status: DC
  Administered 2020-06-16: 7.5 mg via ORAL
  Filled 2020-06-15 (×2): qty 1

## 2020-06-15 MED ORDER — NICOTINE 21 MG/24HR TD PT24
21.0000 mg | MEDICATED_PATCH | Freq: Every day | TRANSDERMAL | Status: DC
  Administered 2020-06-16 – 2020-06-23 (×8): 21 mg via TRANSDERMAL
  Filled 2020-06-15 (×9): qty 1

## 2020-06-15 MED ORDER — METOPROLOL SUCCINATE ER 50 MG PO TB24
50.0000 mg | ORAL_TABLET | Freq: Every day | ORAL | Status: DC
  Administered 2020-06-16: 10:00:00 50 mg via ORAL
  Filled 2020-06-15 (×2): qty 1

## 2020-06-15 MED ORDER — ROSUVASTATIN CALCIUM 20 MG PO TABS
20.0000 mg | ORAL_TABLET | Freq: Every day | ORAL | Status: DC
  Filled 2020-06-15: qty 1

## 2020-06-15 MED ORDER — ONDANSETRON HCL 4 MG/2ML IJ SOLN: 4.0000 mg | Freq: Four times a day (QID) | INTRAMUSCULAR | Status: DC | PRN

## 2020-06-15 MED ORDER — VITAMIN B-12 1000 MCG PO TABS
2500.0000 ug | ORAL_TABLET | Freq: Every day | ORAL | Status: DC
  Administered 2020-06-16 – 2020-06-23 (×6): 2500 ug via ORAL
  Filled 2020-06-15 (×3): qty 3
  Filled 2020-06-15: qty 2.5
  Filled 2020-06-15 (×3): qty 3

## 2020-06-15 MED ORDER — VITAMIN D 25 MCG (1000 UNIT) PO TABS
2000.0000 [IU] | ORAL_TABLET | Freq: Every day | ORAL | Status: DC
  Administered 2020-06-16 – 2020-06-23 (×6): 2000 [IU] via ORAL
  Filled 2020-06-15 (×7): qty 2

## 2020-06-15 MED ORDER — ASPIRIN EC 81 MG PO TBEC
81.0000 mg | DELAYED_RELEASE_TABLET | Freq: Every day | ORAL | Status: DC
  Administered 2020-06-16 – 2020-06-23 (×6): 81 mg via ORAL
  Filled 2020-06-15 (×7): qty 1

## 2020-06-15 MED ORDER — PANTOPRAZOLE SODIUM 40 MG PO TBEC
40.0000 mg | DELAYED_RELEASE_TABLET | Freq: Every day | ORAL | Status: DC
  Administered 2020-06-16 – 2020-06-23 (×6): 40 mg via ORAL
  Filled 2020-06-15 (×7): qty 1

## 2020-06-15 MED ORDER — ISOSORBIDE MONONITRATE ER 30 MG PO TB24
30.0000 mg | ORAL_TABLET | Freq: Every day | ORAL | Status: DC
  Administered 2020-06-16 – 2020-06-23 (×6): 30 mg via ORAL
  Filled 2020-06-15 (×7): qty 1

## 2020-06-15 MED ORDER — LEVETIRACETAM IN NACL 500 MG/100ML IV SOLN
500.0000 mg | Freq: Two times a day (BID) | INTRAVENOUS | Status: DC
  Administered 2020-06-16 – 2020-06-21 (×11): 500 mg via INTRAVENOUS
  Filled 2020-06-15 (×14): qty 100

## 2020-06-15 MED ORDER — GADOBUTROL 1 MMOL/ML IV SOLN
6.0000 mL | Freq: Once | INTRAVENOUS | Status: AC | PRN
  Administered 2020-06-15: 21:00:00 6 mL via INTRAVENOUS

## 2020-06-15 MED ORDER — MAGNESIUM SULFATE 2 GM/50ML IV SOLN
2.0000 g | Freq: Once | INTRAVENOUS | Status: AC
  Administered 2020-06-15: 2 g via INTRAVENOUS
  Filled 2020-06-15: qty 50

## 2020-06-15 MED ORDER — POTASSIUM CHLORIDE CRYS ER 20 MEQ PO TBCR
40.0000 meq | EXTENDED_RELEASE_TABLET | Freq: Once | ORAL | Status: DC
  Filled 2020-06-15: qty 2

## 2020-06-15 MED ORDER — ALPRAZOLAM 0.5 MG PO TABS
2.0000 mg | ORAL_TABLET | Freq: Three times a day (TID) | ORAL | Status: DC
  Administered 2020-06-15 – 2020-06-16 (×2): 2 mg via ORAL
  Filled 2020-06-15: qty 2
  Filled 2020-06-15: qty 4
  Filled 2020-06-15: qty 2

## 2020-06-15 MED ORDER — AMPHETAMINE-DEXTROAMPHETAMINE 10 MG PO TABS
20.0000 mg | ORAL_TABLET | Freq: Every day | ORAL | Status: DC
  Filled 2020-06-15: qty 2

## 2020-06-15 NOTE — ED Notes (Signed)
Pt ambulatory to bathroom with 1x assist, tolerated well, unsteady gait noted. Pt changed into new, clean brief at this time
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
## 2020-06-15 NOTE — ED Notes (Addendum)
Pt placed on 2L via [place] due to desatting while asleep. Pt currently at 95-96% on 2L

## 2020-06-15 NOTE — ED Notes (Signed)
Pt ambulatory to bathroom with one person assist, unsteady gait noted. Pt difficult to redirect.

## 2020-06-15 NOTE — ED Notes (Signed)
Pt not alert enough at this time to safely take PO meds. Will attempt at later time.

## 2020-06-15 NOTE — H&P (Signed)
History and Physical    Lyndie Vanderloop HYW:[id number] DOB: January 10, 1956 DOA: 06/15/2020  PCP: [facility], [place], NP   Patient coming from: Home  I have personally briefly reviewed patient's old medical records in [place]  Chief Complaint: Change in mental status  Most of the history was obtained from patient's daughter over the phone Hermelinda Dellen  HPI: Dearia Wilmouth is a 65 y.o. female with medical history significant for hypertension, diabetes mellitus, COPD, nicotine dependence, obstructive sleep apnea, GERD, anxiety and depression who was brought into the ER by EMS for evaluation of ??  Seizure episode and mental status changes. Patient's daughter states that she was in her usual state of health and had gone to bed last night.  Patient usually sleeps in a recliner in the living room.  She woke up this morning at about 6:30 AM and heard a loud noise in the living room and went intact to find her mother having generalized shaking spell and foaming at the mouth. No incontinence of urine or feces. She states that patient had a similar episode about 6 weeks ago and was hospitalized.  She also states that her mother has a remote history of seizures and has been on Xanax for years.   Patient's daughter states that after the seizure episode the patient became somnolent and so she called 911.  She also states that patient was recently placed on Levaquin for community-acquired pneumonia. Patient is more awake and alert and is oriented to person and place but is unable to provide any history. Labs show sodium 137, potassium 3.3, chloride 94, bicarb 29, BUN 17, creatinine 1.04, calcium 8.9, magnesium 1.6, alkaline phosphatase 61, albumin 3.6, AST 20, ALT 9, total protein 7.1, troponin 16, lactic acid 2.0, white count 9.0, hemoglobin 12.6, hematocrit 37.4, MCV 92.6, RDW 12.7, platelet count 210 Urine drug screen is negative Respiratory viral panel is negative CT scan of the head  without contrast shows chronic atrophic and ischemic changes.  No acute abnormality noted. Chest x-ray reviewed by me shows chronic changes with scarring. Twelve-lead EKG reviewed by me shows normal sinus rhythm with an incomplete right bundle branch block.   ED Course: Patient is a 65 year old female who was brought into the ER by EMS for evaluation after she had a suspected seizure episode at home and became somnolent thereafter.  She was admitted to the hospital 6 weeks ago for similar incident and had an EEG which showed mild to moderate generalized slowing, nonspecific finding consistent with a generalized disturbance of cerebral function including toxic metabolic or structural abnormalities that are multifocal or diffuse.  No definite epileptiform discharges or electrographic seizures noted. She is currently more awake and is oriented to person and place but not back to baseline now referred to observation status.    Review of Systems: As per HPI otherwise all other systems reviewed and negative.    Past Medical History:  Diagnosis Date  . Congestive heart failure ([place])   . COPD (chronic obstructive pulmonary disease) ([place])   . Degenerative joint disease   . Diabetes mellitus without complication ([place])   . Fibromyalgia   . GERD (gastroesophageal reflux disease)   . Hypertension   . Scoliosis   . Sleep apnea     Past Surgical History:  Procedure Laterality Date  . ABDOMINAL HYSTERECTOMY    . APPENDECTOMY    . CESAREAN SECTION     x3  . CHOLECYSTECTOMY    . HIP SURGERY Left  joint replacement  . KYPHOPLASTY N/A 05/18/2019   Procedure: T6 KYPHOPLASTY;  Surgeon: Hessie Knows, MD;  Location: [facility] ORS;  Service: Orthopedics;  Laterality: N/A;     reports that she has been smoking cigarettes. She has been smoking about 0.50 packs per day. She has never used smokeless tobacco. She reports that she does not drink alcohol and does not use drugs.  Allergies  Allergen Reactions   . Tramadol Hives  . Tylenol [Acetaminophen] Nausea And Vomiting  . Vicodin [Hydrocodone-Acetaminophen] Hives    Family History  Problem Relation Age of Onset  . CVA Mother   . Lung cancer Mother   . Diabetes Mother   . Heart attack Father   . Hypertension Father   . Lung cancer Father   . Asthma Father   . Breast cancer Neg Hx       Prior to Admission medications   Medication Sig Start Date End Date Taking? Authorizing Provider  alprazolam Duanne Moron) 2 MG tablet Take 2 mg by mouth 3 (three) times daily.   Yes [provider]  amLODipine (NORVASC) 2.5 MG tablet Take 2.5 mg by mouth daily. 03/27/20  Yes [provider]  amphetamine-dextroamphetamine (ADDERALL) 20 MG tablet Take 20 mg by mouth daily at 12 noon. 04/26/19  Yes [provider]  amphetamine-dextroamphetamine (ADDERALL) 30 MG tablet Take 30 mg by mouth 2 (two) times daily. (morning and evening)   Yes [provider]  ARIPiprazole (ABILIFY) 15 MG tablet Take 7.5 mg by mouth daily.   Yes [provider]  aspirin EC 81 MG tablet Take 81 mg by mouth daily.   Yes [provider]  buprenorphine (SUBUTEX) 8 MG SUBL SL tablet Place 8 mg under the tongue 3 (three) times daily. 02/04/19  Yes [provider]  Cholecalciferol (VITAMIN D3) 50 MCG (2000 UT) TABS Take 2,000 mg by mouth daily.   Yes [provider]  Cyanocobalamin 2500 MCG CHEW Chew 2,500 mcg by mouth daily.   Yes [provider]  diclofenac (VOLTAREN) 50 MG EC tablet Take 50 mg by mouth 2 (two) times daily as needed for mild pain.   Yes [provider]  esomeprazole (NEXIUM) 40 MG capsule Take 40 mg by mouth daily. 02/27/19  Yes [provider]  fluticasone (FLONASE) 50 MCG/ACT nasal spray Place 1 spray into both nostrils at bedtime.   Yes [provider]  Fluticasone-Salmeterol (ADVAIR) 250-50 MCG/DOSE AEPB Inhale 1 puff into the lungs 2 (two) times daily.   Yes [provider]  furosemide (LASIX) 20 MG tablet Take 20 mg by mouth every morning.   Yes [provider]  gabapentin (NEURONTIN) 800 MG tablet Take 800 mg by mouth 4 (four) times daily.   Yes [provider]  isosorbide mononitrate (IMDUR) 30 MG 24 hr tablet Take 30 mg by mouth daily.   Yes [provider]  levofloxacin (LEVAQUIN) 750 MG tablet Take 750 mg by mouth daily. 06/12/20 06/19/20 Yes [provider]  lisinopril (ZESTRIL) 10 MG tablet Take 10 mg by mouth daily.   Yes [provider]  metFORMIN (GLUCOPHAGE) 500 MG tablet Take 500 mg by mouth 2 (two) times daily with a meal.    Yes [provider]  metoprolol succinate (TOPROL-XL) 50 MG 24 hr tablet Take 50 mg by mouth daily.   Yes [provider]  NARCAN 4 MG/0.1ML LIQD nasal spray kit Place 1 spray into the nose as directed.   Yes [provider]  pantoprazole (PROTONIX) 20 MG tablet Take 20 mg by mouth daily. 08/31/19  Yes [provider]  rosuvastatin (CRESTOR) 20 MG tablet Take 20 mg by mouth daily. 09/29/19  Yes [provider]  tiZANidine (ZANAFLEX) 4 MG tablet Take 4 mg by mouth 3 (three) times daily as needed for muscle spasms. 04/22/19  Yes [provider]  Vitamin E 400 units TABS Take 400 Units by mouth daily.   Yes [provider]  zinc gluconate 50 MG tablet Take 50 mg by mouth daily.   Yes [provider]    Physical Exam: Vitals:   06/15/20 0739 06/15/20 0753 06/15/20 0758 06/15/20 0923  BP:  (!) 111/53  125/69  Pulse:  90  82  Resp:  12  12  Temp:   98.7 F (37.1 C)   TempSrc:   Oral   SpO2:  100%  94%  Weight: 63 kg     Height: 5' 3"  (1.6 m)        Vitals:   06/15/20 0739 06/15/20 0753 06/15/20 0758 06/15/20 0923  BP:  (!) 111/53  125/69  Pulse:  90  82  Resp:  12  12  Temp:   98.7 F (37.1 C)   TempSrc:   Oral   SpO2:  100%  94%  Weight: 63 kg     Height: 5' 3"  (1.6 m)          Constitutional: Alert and oriented x 2 . Not in any apparent distress HEENT:      Head: Normocephalic and atraumatic.         Eyes: PERLA, EOMI, Conjunctivae are normal. Sclera is non-icteric.       Mouth/Throat: Mucous membranes are moist.       Neck: Supple with no signs of meningismus. Cardiovascular: Regular rate and rhythm. No murmurs, gallops, or rubs. 2+ symmetrical distal pulses are present . No JVD. No LE edema Respiratory: Respiratory effort normal .Lungs sounds clear bilaterally. No wheezes, crackles, or rhonchi.  Gastrointestinal: Soft, non tender, and non distended with positive bowel sounds.  Genitourinary: No CVA tenderness. Musculoskeletal: Nontender with normal range of motion in all extremities. No cyanosis, or erythema of extremities. Neurologic:  Face is symmetric. Moving all extremities.  Able to move all extremities Skin: Skin is warm, dry.  No rash or ulcers Psychiatric: Mood and affect are normal   Labs on Admission: I have personally reviewed following labs and imaging studies  CBC: Recent Labs  Lab 06/15/20 0749  WBC 9.0  NEUTROABS 7.7  HGB 12.6  HCT 37.4  MCV 92.6  PLT 219   Basic Metabolic Panel: Recent Labs  Lab 06/15/20 0749  NA 137  K 3.3*  CL 94*  CO2 29  GLUCOSE 130*  BUN 17  CREATININE 1.04*  CALCIUM 8.9  MG 1.6*   GFR: Estimated Creatinine Clearance: 47.5 mL/min (A) (by C-G formula based on SCr of 1.04 mg/dL (H)). Liver Function Tests: Recent Labs  Lab 06/15/20 0749  AST 20  ALT 9  ALKPHOS 61  BILITOT 0.4  PROT 7.1  ALBUMIN 3.6   No results for input(s): LIPASE, AMYLASE in the last 168 hours. No results for input(s): AMMONIA in the last 168 hours. Coagulation Profile: No results for input(s): INR, PROTIME in the last 168 hours. Cardiac Enzymes: No results for input(s): CKTOTAL, CKMB, CKMBINDEX, TROPONINI in the last 168 hours. BNP (last 3 results) No results for input(s): PROBNP in the last 8760  hours. HbA1C: No results  for input(s): HGBA1C in the last 72 hours. CBG: No results for input(s): GLUCAP in the last 168 hours. Lipid Profile: No results for input(s): CHOL, HDL, LDLCALC, TRIG, CHOLHDL, LDLDIRECT in the last 72 hours. Thyroid Function Tests: No results for input(s): TSH, T4TOTAL, FREET4, T3FREE, THYROIDAB in the last 72 hours. Anemia Panel: No results for input(s): VITAMINB12, FOLATE, FERRITIN, TIBC, IRON, RETICCTPCT in the last 72 hours. Urine analysis:    Component Value Date/Time   COLORURINE YELLOW (A) 06/15/2020 0914   APPEARANCEUR HAZY (A) 06/15/2020 0914   APPEARANCEUR Cloudy (A) 10/22/2019 1025   LABSPEC 1.017 06/15/2020 0914   PHURINE 6.0 06/15/2020 0914   GLUCOSEU NEGATIVE 06/15/2020 0914   HGBUR NEGATIVE 06/15/2020 0914   BILIRUBINUR NEGATIVE 06/15/2020 0914   BILIRUBINUR Negative 10/22/2019 1025   [place] 06/15/2020 0914   PROTEINUR 30 (A) 06/15/2020 0914   NITRITE NEGATIVE 06/15/2020 0914   LEUKOCYTESUR NEGATIVE 06/15/2020 0914    Radiological Exams on Admission: CT Head Wo Contrast  Result Date: 06/15/2020 CLINICAL DATA:  Recent seizure activity, initial encounter EXAM: CT HEAD WITHOUT CONTRAST TECHNIQUE: Contiguous axial images were obtained from the base of the skull through the vertex without intravenous contrast. 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 No findings to suggest acute hemorrhage, acute infarction or space-occupying mass lesion are noted. Mild atrophic changes and chronic white matter ischemic changes are noted. Vascular: No hyperdense vessel or unexpected calcification. Skull: Normal. Negative for fracture or focal lesion. Sinuses/Orbits: No acute finding. Other: None IMPRESSION: Chronic atrophic and ischemic changes.  No acute abnormality noted. Electronically Signed   By: Inez Catalina M.D.   On: 06/15/2020 08:20      Assessment/Plan Principal Problem:   AMS (altered mental status) Active Problems:   Hypertension   GERD (gastroesophageal reflux disease)   Diabetes mellitus without complication ([place])   Depression with anxiety   Nicotine dependence   Seizure (H[facility])   Lactic acidosis      Altered mental status ??  Postictal state Patient was said to have ??  Witnessed seizure at home which her daughter describes as generalized shaking spells with foaming at the mouth and patient became somnolent after the episode. Patient is more awake but not back to her baseline and is only oriented to person and place We will monitor closely during this hospitalization    Seizure ??  Benzodiazepine withdrawal Patient is supposed to be on scheduled Xanax for anxiety disorder but her urine drug screen is negative for benzodiazepines We will place patient on as needed lorazepam for seizures We will resume Xanax Patient was admitted about 6 weeks ago for similar episode and had an EEG which showed mild to moderate generalized slowing, nonspecific finding consistent with a generalized disturbance of cerebral function including toxic metabolic or structural abnormalities that are multifocal or diffuse.  No definite epileptiform discharges or electrographic seizures noted. We will consult neurology    Diabetes mellitus Hold metformin Check blood sugars every 4 hours N.p.o. until more awake    Lactic acidosis No evidence of an infectious process at this time May be secondary to metformin use rule out rhabdomyolysis from seizure Obtain total CK level    Nicotine dependence Smoking cessation will be discussed with patient once she is more awake Place patient on nicotine transdermal patch 21 mg     Depression Continue Abilify    Hypertension Continue lisinopril, nitrates, metoprolol as much as blood pressure tolerates Hold  amlodipine for now    GERD Continue Protonix    COPD Not  acutely exacerbated Continue as needed bronchodilator therapy as well as inhaled steroids  DVT prophylaxis: Lovenox Code Status: full code Family Communication: Greater than 50% of time was spent discussing patient's condition and plan of care with her daughter Ms Freeman Caldron over the phone.  All questions and concerns have been addressed.  She verbalizes understanding and agrees with the plan. Disposition Plan: Back to previous home environment Consults called: Neurology Status: Observation    Kyiah Canepa MD [facility]     06/15/2020, 10:47 AM

## 2020-06-15 NOTE — Progress Notes (Signed)
Brief Neurology Note  65 yo woman admitted after possible sz-like episode. She certainly could have had a seizure based on description, and seems potentially post-ictal on exam. I'm going to restart her gabapentin, it could make her drowsy but she is on a high dose and withdrawal from that could make her seize. I am also going to start her on keppra given that she has had 2 admissions for this over the past 6 wks. She had EEG last admission, no indication for repeat, but I am going to order a brain MRI with and without contrast which she has not had. Monitor overnight for return to mental status baseline. I see she was on levaquin for community-acquired PNA. If she still needs abx, please use something other than a fluoroquinolone bc they can decrease seizure threshold. Full note to follow.  Bing Neighbors, MD [facility] [phone]  If 7pm- 7am, please page neurology on call as listed in [facility].

## 2020-06-15 NOTE — ED Provider Notes (Signed)
[facility] Emergency Department Provider Note ____________________________________________   Event Date/Time   First MD Initiated Contact with Patient 06/15/20 [id number]     (approximate)  I have reviewed the triage vital signs and the nursing notes.  HISTORY  Chief Complaint Seizures   HPI Angela Daniel is a 65 y.o. femalewho presents to the ED for evaluation of witnessed seizure-like activity.  Chart review indicates history of HTN, HLD, DM, COPD, GERD, fibromyalgia and chronic pain syndrome on multiple analgesics and sedating medications. Admission about 6 weeks ago for similar presentation of possible seizure-like activity at home, presenting somnolent and was discharged the following day with likely cause being polypharmacy.  They withheld many of her medications, mental status normalized and she was discharged home.  Patient presents to the ED today for evaluation of possible seizure.  Patient unable to provide any relevant history due to her somnolence, altered mentation and disorientation.  Denies any pain right now.  64 of history is obtained from the daughter over the phone.  Patient lives with daughter.  She reports patient recently started a course of Levaquin for pneumonia in the past 2 or 3 days.  She seemed fine yesterday and when she went to bed last night.  Daughter reports hearing a noise earlier this morning, finding her mother shaking or trembling in a generalized fashion with her eyes rolled back in her head while in her recliner.  This lasted about 1 minute, self resolved and did not recur.  Daughter reports that she was somnolent and confused after this, but awakened to EMS sternal rub.   Past Medical History:  Diagnosis Date  . Congestive heart failure ([place])   . COPD (chronic obstructive pulmonary disease) ([place])   . Degenerative joint disease   . Diabetes mellitus without complication ([place])   . Fibromyalgia   . GERD  (gastroesophageal reflux disease)   . Hypertension   . Scoliosis   . Sleep apnea     Patient Active Problem List   Diagnosis Date Noted  . AMS (altered mental status) 06/15/2020  . COPD (chronic obstructive pulmonary disease) ([place])   . Hypertension   . GERD (gastroesophageal reflux disease)   . Fibromyalgia   . Diabetes mellitus without complication ([place])   . Congestive heart failure ([place])   . Acute metabolic encephalopathy   . HLD (hyperlipidemia)   . Depression with anxiety   . Acute respiratory failure with hypoxia ([place]) 11/07/2014  . COPD exacerbation ([place]) 11/07/2014  . CAP (community acquired pneumonia) 11/07/2014    Past Surgical History:  Procedure Laterality Date  . ABDOMINAL HYSTERECTOMY    . APPENDECTOMY    . CESAREAN SECTION     x3  . CHOLECYSTECTOMY    . HIP SURGERY Left    joint replacement  . KYPHOPLASTY N/A 05/18/2019   Procedure: T6 KYPHOPLASTY;  Surgeon: Hessie Knows, MD;  Location: [facility] ORS;  Service: Orthopedics;  Laterality: N/A;    Prior to Admission medications   Medication Sig Start Date End Date Taking? Authorizing Provider  alprazolam Duanne Moron) 2 MG tablet Take 2 mg by mouth 3 (three) times daily.   Yes [provider]  amLODipine (NORVASC) 2.5 MG tablet Take 2.5 mg by mouth daily. 03/27/20  Yes [provider]  amphetamine-dextroamphetamine (ADDERALL) 20 MG tablet Take 20 mg by mouth daily at 12 noon. 04/26/19  Yes [provider]  amphetamine-dextroamphetamine (ADDERALL) 30 MG tablet Take 30 mg by mouth 2 (two) times daily. (morning and  evening)   Yes [provider]  ARIPiprazole (ABILIFY) 15 MG tablet Take 7.5 mg by mouth daily.   Yes [provider]  aspirin EC 81 MG tablet Take 81 mg by mouth daily.   Yes [provider]  buprenorphine (SUBUTEX) 8 MG SUBL SL tablet Place 8 mg under the tongue 3 (three) times daily. 02/04/19  Yes [provider]  Cholecalciferol (VITAMIN D3) 50 MCG  (2000 UT) TABS Take 2,000 mg by mouth daily.   Yes [provider]  Cyanocobalamin 2500 MCG CHEW Chew 2,500 mcg by mouth daily.   Yes [provider]  diclofenac (VOLTAREN) 50 MG EC tablet Take 50 mg by mouth 2 (two) times daily as needed for mild pain.   Yes [provider]  esomeprazole (NEXIUM) 40 MG capsule Take 40 mg by mouth daily. 02/27/19  Yes [provider]  fluticasone (FLONASE) 50 MCG/ACT nasal spray Place 1 spray into both nostrils at bedtime.   Yes [provider]  Fluticasone-Salmeterol (ADVAIR) 250-50 MCG/DOSE AEPB Inhale 1 puff into the lungs 2 (two) times daily.   Yes [provider]  furosemide (LASIX) 20 MG tablet Take 20 mg by mouth every morning.   Yes [provider]  gabapentin (NEURONTIN) 800 MG tablet Take 800 mg by mouth 4 (four) times daily.   Yes [provider]  isosorbide mononitrate (IMDUR) 30 MG 24 hr tablet Take 30 mg by mouth daily.   Yes [provider]  levofloxacin (LEVAQUIN) 750 MG tablet Take 750 mg by mouth daily. 06/12/20 06/19/20 Yes [provider]  lisinopril (ZESTRIL) 10 MG tablet Take 10 mg by mouth daily.   Yes [provider]  metFORMIN (GLUCOPHAGE) 500 MG tablet Take 500 mg by mouth 2 (two) times daily with a meal.    Yes [provider]  metoprolol succinate (TOPROL-XL) 50 MG 24 hr tablet Take 50 mg by mouth daily.   Yes [provider]  NARCAN 4 MG/0.1ML LIQD nasal spray kit Place 1 spray into the nose as directed.   Yes [provider]  pantoprazole (PROTONIX) 20 MG tablet Take 20 mg by mouth daily. 08/31/19  Yes [provider]  rosuvastatin (CRESTOR) 20 MG tablet Take 20 mg by mouth daily. 09/29/19  Yes [provider]  tiZANidine (ZANAFLEX) 4 MG tablet Take 4 mg by mouth 3 (three) times daily as needed for muscle spasms. 04/22/19  Yes [provider]  Vitamin E 400 units TABS Take 400 Units by mouth  daily.   Yes [provider]  zinc gluconate 50 MG tablet Take 50 mg by mouth daily.   Yes [provider]    Allergies Tramadol, Tylenol [acetaminophen], and Vicodin [hydrocodone-acetaminophen]  Family History  Problem Relation Age of Onset  . CVA Mother   . Lung cancer Mother   . Diabetes Mother   . Heart attack Father   . Hypertension Father   . Lung cancer Father   . Asthma Father   . Breast cancer Neg Hx     Social History Social History   Tobacco Use  . Smoking status: Current Every Day Smoker    Packs/day: 0.50    Types: Cigarettes  . Smokeless tobacco: Never Used  Vaping Use  . Vaping Use: Never used  Substance Use Topics  . Alcohol use: No  . Drug use: No    Review of Systems  Unable to be accurately assessed due to patient's altered mentation. ____________________________________________  PHYSICAL EXAM:  VITAL SIGNS: Vitals:   06/15/20 0758 06/15/20 0923  BP:  125/69  Pulse:  82  Resp:  12  Temp: 98.7 F (37.1 C)   SpO2:  94%     Constitutional: Sleepy and oriented to self only.  No distress.  When I first see her, she is sitting up independently at the foot of the stretcher.  With my direction, she was able to scoot backwards using all 4 extremities to sit back in the bed in appropriate position.  Eyes: Conjunctivae are normal.  Pupils midrange and PERRL. EOMI. Head: Atraumatic. Nose: No congestion/rhinnorhea. Mouth/Throat: Mucous membranes are moist.  Oropharynx non-erythematous. Neck: No stridor. No cervical spine tenderness to palpation. Cardiovascular: Normal rate, regular rhythm. Grossly normal heart sounds.  Good peripheral circulation. Respiratory: Normal respiratory effort.  No retractions. Lungs CTAB. Gastrointestinal: Soft , nondistended, nontender to palpation. No CVA tenderness.  Soft and benign throughout. Musculoskeletal: No lower extremity tenderness nor edema.  No joint effusions.  Small skin tear to the right  distal shin without bony step-offs, purulence or further signs of trauma. Neurologic:   No gross focal neurologic deficits are appreciated.  Follows commands in all 4 extremities. Skin:  Skin is warm, dry . No rash noted. Psychiatric: Mood and affect are difficult to assess due to her decreased mental status.  ____________________________________________   LABS (all labs ordered are listed, but only abnormal results are displayed)  Labs Reviewed  COMPREHENSIVE METABOLIC PANEL - Abnormal; Notable for the following components:      Result Value   Potassium 3.3 (*)    Chloride 94 (*)    Glucose, Bld 130 (*)    Creatinine, Ser 1.04 (*)    GFR, Estimated 59 (*)    All other components within normal limits  MAGNESIUM - Abnormal; Notable for the following components:   Magnesium 1.6 (*)    All other components within normal limits  URINALYSIS, COMPLETE (UACMP) WITH MICROSCOPIC - Abnormal; Notable for the following components:   Color, Urine YELLOW (*)    APPearance HAZY (*)    Protein, ur 30 (*)    Bacteria, UA RARE (*)    All other components within normal limits  LACTIC ACID, PLASMA - Abnormal; Notable for the following components:   Lactic Acid, Venous 2.0 (*)    All other components within normal limits  RESP PANEL BY RT-PCR (FLU A&B, COVID) ARPGX2  CBC WITH DIFFERENTIAL/PLATELET  URINE DRUG SCREEN, QUALITATIVE ([facility] ONLY)  LACTIC ACID, PLASMA  CK  TROPONIN I (HIGH SENSITIVITY)  TROPONIN I (HIGH SENSITIVITY)   ____________________________________________  12 Lead EKG  Sinus rhythm, rate of 81 bpm.  Normal axis.  Incomplete right bundle and otherwise normal intervals.  No evidence of acute ischemia. ____________________________________________  RADIOLOGY  ED MD interpretation: CT head reviewed by me without evidence of acute intracranial pathology. CXR reviewed by me without evidence of acute cardiopulmonary pathology.  Official radiology report(s): CT Head Wo  Contrast  Result Date: 06/15/2020 CLINICAL DATA:  Recent seizure activity, initial encounter EXAM: CT HEAD WITHOUT CONTRAST TECHNIQUE: Contiguous axial images were obtained from the base of the skull through the vertex without intravenous contrast. 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 No findings to suggest acute hemorrhage, acute infarction or space-occupying mass lesion are noted. Mild atrophic changes and chronic white matter ischemic changes are noted. Vascular: No hyperdense vessel or unexpected calcification. Skull: Normal. Negative for fracture or focal  lesion. Sinuses/Orbits: No acute finding. Other: None IMPRESSION: Chronic atrophic and ischemic changes.  No acute abnormality noted. Electronically Signed   By: Inez Catalina M.D.   On: 06/15/2020 08:20    ____________________________________________   PROCEDURES and INTERVENTIONS  Procedure(s) performed (including Critical Care):  .1-3 Lead EKG Interpretation Performed by: Vladimir Crofts, MD Authorized by: Vladimir Crofts, MD     Interpretation: normal     ECG rate:  80   ECG rate assessment: normal     Rhythm: sinus rhythm     Ectopy: none     Conduction: normal      Medications  aspirin EC tablet 81 mg (has no administration in time range)  buprenorphine (SUBUTEX) sublingual tablet 8 mg (has no administration in time range)  amLODipine (NORVASC) tablet 2.5 mg (has no administration in time range)  furosemide (LASIX) tablet 20 mg (has no administration in time range)  isosorbide mononitrate (IMDUR) 24 hr tablet 30 mg (has no administration in time range)  lisinopril (ZESTRIL) tablet 10 mg (has no administration in time range)  metoprolol succinate (TOPROL-XL) 24 hr tablet 50 mg (has no administration in time range)  rosuvastatin (CRESTOR) tablet 20 mg (has no administration in time range)  ALPRAZolam (XANAX) tablet 2 mg (has no administration in  time range)  amphetamine-dextroamphetamine (ADDERALL) tablet 20 mg (has no administration in time range)  amphetamine-dextroamphetamine (ADDERALL) tablet 1 tablet (has no administration in time range)  ARIPiprazole (ABILIFY) tablet 7.5 mg (has no administration in time range)  pantoprazole (PROTONIX) EC tablet 40 mg (has no administration in time range)  Cyanocobalamin CHEW 2,500 mcg (has no administration in time range)  Vitamin D3 TABS 2,000 mg (has no administration in time range)  Vitamin E TABS 400 Units (has no administration in time range)  fluticasone (FLONASE) 50 MCG/ACT nasal spray 1 spray (has no administration in time range)  mometasone-formoterol (DULERA) 200-5 MCG/ACT inhaler 2 puff (has no administration in time range)  enoxaparin (LOVENOX) injection 40 mg (has no administration in time range)  ondansetron (ZOFRAN) tablet 4 mg (has no administration in time range)    Or  ondansetron (ZOFRAN) injection 4 mg (has no administration in time range)  lactated ringers bolus 1,000 mL (1,000 mLs Intravenous New Bag/Given 06/15/20 0830)    ____________________________________________   MDM / ED COURSE   65 year old woman presents to the ED with altered mentation and somnolence, after witnessed seizure-like activity at home, possibly due to polypharmacy and requiring medical observation admission.  Normal vitals on room air.  Exam demonstrates nonfocal lethargy without evidence of neurologic or vascular deficits.  No distress.  She has a small abrasion or skin tear to her right distal shin, but no further evidence of trauma.  She is a small lactic acidosis of 2.0, but no further evidence of sepsis.  CXR demonstrates no focal infiltrates and CT head demonstrates no ICH, mass or signs of acute CVA.  Her mental status improved throughout her stay in the ED, but she continues to be disoriented and altered.  We will therefore admit to hospitalist for further work-up and management.  Clinical  Course as of 06/15/20 1038  Thu Jun 15, 2020  0751 Called daughter, Sharyn Lull, who lives with mom. Looked like a full fledged seizure, eyes rolled back, whole body jerking, toes pointed up. This has happened before, fixed by her Xanaxes. She has pneumonia, told 2 days ago, and on an antibiotic. Has not been taking tinazidine. Looked good yesterday. Fine when she went  to bed last night. Sleeps in the recliner in the living room, they heard a noise this AM, found her shaking all over. Lasted a minute, but wouldn't wake up after, awakened with EMS sternal rub. Just one episode of shaking/seizure.  [DS]  7471 No abx in patient's med bag. I call daughter back. She cannot find another antibiotic at home.  [DS]  [place] tech found out that patient has been prescribed levaquin, on 5/23 [DS]  0901 Reassessed.  Patient reports feeling better and she is now more awake to me.  Reports that she does not know what happened this morning. [DS]    Clinical Course User Index [DS] Vladimir Crofts, MD    ____________________________________________   FINAL CLINICAL IMPRESSION(S) / ED DIAGNOSES  Final diagnoses:  Seizure-like activity ([place])  Polypharmacy  Altered mental status, unspecified altered mental status type     ED Discharge Orders    None       Erlinda Solinger   Note:  This document was prepared using Dragon voice recognition software and may include unintentional dictation errors.   Vladimir Crofts, MD 06/15/20 1040

## 2020-06-15 NOTE — ED Notes (Signed)
Isabella RN aware of assigned bed 

## 2020-06-15 NOTE — ED Notes (Signed)
Skin tear present to right ankle cleaned, petroleum dressing and guaze placed.

## 2020-06-15 NOTE — ED Notes (Signed)
Patient transported to CT 

## 2020-06-15 NOTE — ED Notes (Signed)
Seizure pads placed on side rails of stretcher.  

## 2020-06-16 DIAGNOSIS — E872 Acidosis: Secondary | ICD-10-CM | POA: Diagnosis not present

## 2020-06-16 DIAGNOSIS — E119 Type 2 diabetes mellitus without complications: Secondary | ICD-10-CM | POA: Diagnosis not present

## 2020-06-16 DIAGNOSIS — R4182 Altered mental status, unspecified: Secondary | ICD-10-CM | POA: Diagnosis not present

## 2020-06-16 DIAGNOSIS — I10 Essential (primary) hypertension: Secondary | ICD-10-CM | POA: Diagnosis not present

## 2020-06-16 LAB — GLUCOSE, CAPILLARY
Glucose-Capillary: 105 mg/dL — ABNORMAL HIGH (ref 70–99)
Glucose-Capillary: 107 mg/dL — ABNORMAL HIGH (ref 70–99)
Glucose-Capillary: 118 mg/dL — ABNORMAL HIGH (ref 70–99)
Glucose-Capillary: 123 mg/dL — ABNORMAL HIGH (ref 70–99)
Glucose-Capillary: 88 mg/dL (ref 70–99)
Glucose-Capillary: 95 mg/dL (ref 70–99)
Glucose-Capillary: 97 mg/dL (ref 70–99)

## 2020-06-16 LAB — CBC
HCT: 36.6 % (ref 36.0–46.0)
Hemoglobin: 12.3 g/dL (ref 12.0–15.0)
MCH: 31.8 pg (ref 26.0–34.0)
MCHC: 33.6 g/dL (ref 30.0–36.0)
MCV: 94.6 fL (ref 80.0–100.0)
Platelets: 191 10*3/uL (ref 150–400)
RBC: 3.87 MIL/uL (ref 3.87–5.11)
RDW: 13 % (ref 11.5–15.5)
WBC: 6.1 10*3/uL (ref 4.0–10.5)
nRBC: 0 % (ref 0.0–0.2)

## 2020-06-16 LAB — BASIC METABOLIC PANEL
Anion gap: 8 (ref 5–15)
BUN: 15 mg/dL (ref 8–23)
CO2: 29 mmol/L (ref 22–32)
Calcium: 8.4 mg/dL — ABNORMAL LOW (ref 8.9–10.3)
Chloride: 104 mmol/L (ref 98–111)
Creatinine, Ser: 0.66 mg/dL (ref 0.44–1.00)
GFR, Estimated: >60 mL/min (ref >60)
Glucose, Bld: 104 mg/dL — ABNORMAL HIGH (ref 70–99)
Potassium: 3.7 mmol/L (ref 3.5–5.1)
Sodium: 141 mmol/L (ref 135–145)

## 2020-06-16 MED ORDER — INSULIN ASPART 100 UNIT/ML IJ SOLN
0.0000 [IU] | Freq: Three times a day (TID) | INTRAMUSCULAR | Status: DC
  Administered 2020-06-21: 17:00:00 1 [IU] via SUBCUTANEOUS
  Filled 2020-06-16: qty 1

## 2020-06-16 MED ORDER — CHLORHEXIDINE GLUCONATE CLOTH 2 % EX PADS
6.0000 | MEDICATED_PAD | Freq: Every day | CUTANEOUS | Status: DC
  Administered 2020-06-16 – 2020-06-21 (×5): 6 via TOPICAL

## 2020-06-16 MED ORDER — ALPRAZOLAM 0.5 MG PO TABS
2.0000 mg | ORAL_TABLET | Freq: Two times a day (BID) | ORAL | Status: DC
  Filled 2020-06-16: qty 4

## 2020-06-16 NOTE — Care Management Obs Status (Deleted)
MEDICARE OBSERVATION STATUS NOTIFICATION   Patient Details  Name: Angela Daniel MRN: [id number] Date of Birth: 09/11/1955   Medicare Observation Status Notification Given:  Yes    Allayne Butcher, RN 06/16/2020, 11:26 AM

## 2020-06-16 NOTE — Progress Notes (Addendum)
Pt admitted to ICU near 1500. IV keppra given upon ICU arrival. Pt lethargic, responds to stimulation and loud voice, intermittently agitated and moves all four extremities. EEG completed upon arrival. Pt states name only. Daughter @ bedside. Purewick in place.

## 2020-06-16 NOTE — Progress Notes (Signed)
Neurology Progress Note  Subjective: - Pt extremely somnolent today - Discussed discontinuation and downtitration of various psychoactive meds with hospitalist, see below - EEG performed 2/2 somnolence, showed mild diffuse slowing but no seizure activity  Exam: Vitals:   06/16/20 1700 06/16/20 1800  BP: (!) 105/55 113/61  Pulse: 94 90  Resp: 11 11  Temp:    SpO2: 96% 94%   Gen: In bed, somnolent Resp: non-labored breathing, no acute distress Abd: soft, nt  Neuro: MS: somnolent, does not follow commands RS:WNIOE, (+) corneals, oculocephalics, cough, gag Motor and sensory: moves all extremities spontaneously per RN and to noxious stimuli for this examiner   Impression: 65 yo woman with hx benzodiazepine w/d seizures admitted with encephalopathy and polypharmacy  Recommendations: 1) Agree with d/c gabapentin (while this is an AED she is now covered by keppra) 2) Agree w/ d/c stimulants 3) Wean xanax 4) Will continue to monitor  Bing Neighbors, MD [facility] [phone]  If 7pm- 7am, please page neurology on call as listed in AMION.

## 2020-06-16 NOTE — Progress Notes (Signed)
[facility]  PROGRESS NOTE  Angela Daniel WUJ:[id number] DOB: 03-20-1955 DOA: 06/15/2020 PCP: [facility], NP   Brief HPI:   65 year old female with history of hypertension, diabetes mellitus type 2, COPD, nicotine dependence, OSA, GERD, anxiety, depression was brought to the ER by EMS for evaluation of possible seizure episode and mental status change. Patient daughter noted that patient was sleeping in recliner living room and woke up in the morning at 6:30 AM and heard a loud noise in the living room and went to find the mother was having generalized shaking spell and foaming at the mouth. She states that patient had similar episode about 6 weeks ago and was hospitalized.  Her mother has a remote history of seizures and has been on Xanax for years. In the ED CT of the head showed chronic atrophic and ischemic changes, no acute normal to noted.    Subjective   Patient seen, has been very somnolent this morning after she got her medications.  Barely opens eyes to sternal rub.   Assessment/Plan:     Altered mental status-?  Postictal state from seizure.  There was questionable witnessed seizure at home as per daughter.  Patient has been started on IV Keppra per neurology.  Patient is also on multiple psychotropic medications including Xanax 2 mg 3 times daily, gabapentin 800 mg 4 times a day, Abilify, buprenorphine Adderall, discussed with neurologist, will discontinue gabapentin, try to wean off Xanax pulmonary follow-up.  We will change it to 2 mg p.o. twice daily.  Will discontinue Adderall.  We will closely monitor patient in stepdown unit.   Seizure  ?  Benzodiazepine withdrawal.  Patient is on Xanax for anxiety disorder for long time.  Urine drug screen was negative for benzodiazepine.  Patient has been started on Keppra by neurology.  No definite epileptiform discharges seen on EEG.   Diabetes mellitus type 2 Metformin on hold, CBG well controlled. Start  sliding scale insulin with NovoLog   Lactic acidosis Lactic acid was elevated 2.0, improved to 1.3.  Total CK is 775. Likely from seizure.  Hypomagnesemia Magnesium on admission was 1.6.  We will give mag sulfate 2 g IV x1. Follow serum magnesium level in a.m.  Hypertension Blood pressure stable, continue metoprolol, will hold lisinopril and Lasix at this time due to soft blood pressure.  Continue    Scheduled medications:   . alprazolam  2 mg Oral BID  . aspirin EC  81 mg Oral Daily  . buprenorphine  8 mg Sublingual TID  . Chlorhexidine Gluconate Cloth  6 each Topical Daily  . cholecalciferol  2,000 Units Oral Daily  . enoxaparin (LOVENOX) injection  40 mg Subcutaneous Q24H  . fluticasone  1 spray Each Nare QHS  . furosemide  20 mg Oral q morning  . isosorbide mononitrate  30 mg Oral Daily  . lisinopril  10 mg Oral Daily  . metoprolol succinate  50 mg Oral Daily  . mometasone-formoterol  2 puff Inhalation BID  . nicotine  21 mg Transdermal Daily  . pantoprazole  40 mg Oral Daily  . vitamin B-12  2,500 mcg Oral Daily  . vitamin E  400 Units Oral Daily         Data Reviewed:   CBG:  Recent Labs  Lab 06/16/20 0039 06/16/20 0426 06/16/20 0801 06/16/20 1215 06/16/20 1503  GLUCAP 118* 123* 107* 105* 95    SpO2: 97 % O2 Flow Rate (L/min): 2 L/min  Vitals:   06/16/20 0804 06/16/20 1216 06/16/20 1514 06/16/20 1600  BP: (!) 115/56 111/61 114/64 (!) 110/53  Pulse: 80 94  90  Resp: 17 12 13 11   Temp: 98.2 F (36.8 C) 98 F (36.7 C) (!) 97 F (36.1 C)   TempSrc:   Axillary   SpO2: 100% 92% 96% 97%  Weight:      Height:         Intake/Output Summary (Last 24 hours) at 06/16/2020 1710 Last data filed at 06/16/2020 0521 Gross per 24 hour  Intake 685.33 ml  Output 480 ml  Net 205.33 ml    05/25 1901 - 05/27 0700 In: 1685.3 [I.V.:585.3] Out: 480 [Urine:480]  Filed Weights   06/15/20 0739  Weight: 63 kg    CBC:  Recent Labs  Lab  06/15/20 0749 06/16/20 0535  WBC 9.0 6.1  HGB 12.6 12.3  HCT 37.4 36.6  PLT 210 191  MCV 92.6 94.6  MCH 31.2 31.8  MCHC 33.7 33.6  RDW 12.7 13.0  LYMPHSABS 0.7  --   MONOABS 0.5  --   EOSABS 0.1  --   BASOSABS 0.0  --     Complete metabolic panel:  Recent Labs  Lab 06/15/20 0749 06/15/20 1032 06/16/20 0535  NA 137  --  141  K 3.3*  --  3.7  CL 94*  --  104  CO2 29  --  29  GLUCOSE 130*  --  104*  BUN 17  --  15  CREATININE 1.04*  --  0.66  CALCIUM 8.9  --  8.4*  AST 20  --   --   ALT 9  --   --   ALKPHOS 61  --   --   BILITOT 0.4  --   --   ALBUMIN 3.6  --   --   MG 1.6*  --   --   LATICACIDVEN 2.0* 1.3  --     No results for input(s): LIPASE, AMYLASE in the last 168 hours.  Recent Labs  Lab 06/15/20 0914  SARSCOV2NAA NEGATIVE    ------------------------------------------------------------------------------------------------------------------ No results for input(s): CHOL, HDL, LDLCALC, TRIG, CHOLHDL, LDLDIRECT in the last 72 hours.  Lab Results  Component Value Date   HGBA1C 6.0 (H) 05/01/2020   ------------------------------------------------------------------------------------------------------------------ No results for input(s): TSH, T4TOTAL, T3FREE, THYROIDAB in the last 72 hours.  Invalid input(s): FREET3 ------------------------------------------------------------------------------------------------------------------ No results for input(s): VITAMINB12, FOLATE, FERRITIN, TIBC, IRON, RETICCTPCT in the last 72 hours.  Coagulation profile No results for input(s): INR, PROTIME in the last 168 hours. No results for input(s): DDIMER in the last 72 hours.  Cardiac Enzymes Recent Labs  Lab 06/15/20 1032  CKTOTAL 775*    ------------------------------------------------------------------------------------------------------------------    Component Value Date/Time   BNP 156.9 (H) 04/30/2020 0605     Antibiotics: Anti-infectives (From  admission, onward)   None       Radiology Reports  CT Head Wo Contrast  Result Date: 06/15/2020 CLINICAL DATA:  Recent seizure activity, initial encounter EXAM: CT HEAD WITHOUT CONTRAST TECHNIQUE: Contiguous axial images were obtained from the base of the skull through the vertex without intravenous contrast. 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 No findings to suggest acute hemorrhage, acute infarction or space-occupying mass lesion are noted. Mild atrophic changes and chronic white matter ischemic changes are noted. Vascular: No hyperdense vessel or unexpected calcification. Skull: Normal. Negative for fracture or focal lesion. Sinuses/Orbits: No acute finding.  Other: None IMPRESSION: Chronic atrophic and ischemic changes.  No acute abnormality noted. Electronically Signed   By: Alcide Clever M.D.   On: 06/15/2020 08:20   MR BRAIN W WO CONTRAST  Result Date: 06/15/2020 CLINICAL DATA:  Initial evaluation for acute seizure. EXAM: MRI HEAD WITHOUT AND WITH CONTRAST TECHNIQUE: Multiplanar, multiecho pulse sequences of the brain and surrounding structures were obtained without and with intravenous contrast. CONTRAST:  17mL GADAVIST GADOBUTROL 1 MMOL/ML IV SOLN COMPARISON:  Prior CT from earlier the same day. FINDINGS: Brain: Examination mildly degraded by motion artifact. Cerebral volume within normal limits for age. Scattered patchy T2/FLAIR hyperintensity seen within the periventricular, deep, and subcortical white matter both cerebral hemispheres, nonspecific, but most commonly related to chronic microvascular ischemic disease. Overall, appearance is moderate in nature. Few small dilated perivascular spaces noted at the inferior basal ganglia bilaterally. No abnormal foci of restricted diffusion to suggest acute or subacute ischemia or changes related to seizure. Gray-white matter differentiation maintained. No  encephalomalacia to suggest chronic cortical infarction. No evidence for acute or chronic intracranial hemorrhage. No mass lesion, midline shift or mass effect. No hydrocephalus or extra-axial fluid collection. Pituitary gland and suprasellar region within normal limits. Midline structures intact. No intrinsic temporal lobe abnormality. No abnormal enhancement. Vascular: Right vertebral artery hypoplastic and not well seen. Major intracranial vascular flow voids are otherwise well maintained. Skull and upper cervical spine: Degenerative thickening noted at the tectorial membrane without significant stenosis. Craniocervical junction otherwise unremarkable. Bone marrow signal intensity within normal limits. Bone marrow signal intensity normal. No scalp soft tissue abnormality. Sinuses/Orbits: Globes and orbital soft tissues demonstrate no acute finding. Paranasal sinuses are clear. Small left mastoid effusion noted, of doubtful significance. Inner ear structures grossly normal. Other: None. IMPRESSION: 1. No acute intracranial abnormality. 2. Moderate T2/FLAIR hyperintensity involving the supratentorial cerebral white matter, nonspecific, but most commonly related to chronic microvascular ischemic disease. Electronically Signed   By: Rise Mu M.D.   On: 06/15/2020 21:38   DG Chest Portable 1 View  Result Date: 06/15/2020 CLINICAL DATA:  Recent pneumonia, value 8 for infiltrate EXAM: PORTABLE CHEST 1 VIEW COMPARISON:  04/30/2020 FINDINGS: Low volume chest with streaky/indistinct density at the bases which is unchanged. There was lower lobe scarring on April 2021 chest CT. There is no edema, acute consolidation, effusion, or pneumothorax. Normal heart size and stable mediastinal contours. IMPRESSION: Stable chest with scar-like appearance at the left more right base. Electronically Signed   By: Marnee Spring M.D.   On: 06/15/2020 08:46      DVT prophylaxis: Lovenox  Code Status: Full  code  Family Communication: No family at bedside   Consultants:  Neurology  Procedures:      Objective    Physical Examination:    General: Appears lethargic  Cardiovascular: S1-S2, regular, no murmur auscultated  Respiratory: Clear to auscultation bilaterally  Abdomen: Abdomen is soft, nontender, no organomegaly  Extremities: No edema in the lower extremities  Neurologic: Somnolent, arousable on sternal rub   Status is: Inpatient  Dispo: The patient is from: Home              Anticipated d/c is to: Home              Anticipated d/c date is: 06/19/2020              Patient currently not stable for discharge  Barrier to discharge-   COVID-19 Labs  No results for input(s): DDIMER, FERRITIN, LDH, CRP in  the last 72 hours.  Lab Results  Component Value Date   SARSCOV2NAA NEGATIVE 06/15/2020   SARSCOV2NAA NEGATIVE 04/30/2020   SARSCOV2NAA NEGATIVE 05/14/2019    Microbiology  Recent Results (from the past 240 hour(s))  Resp Panel by RT-PCR (Flu A&B, Covid)     Status: None   Collection Time: 06/15/20  9:14 AM   Specimen: Nasopharyngeal(NP) swabs in vial transport medium  Result Value Ref Range Status   SARS Coronavirus 2 by RT PCR NEGATIVE NEGATIVE Final    Comment: (NOTE) SARS-CoV-2 target nucleic acids are NOT DETECTED.  The SARS-CoV-2 RNA is generally detectable in upper respiratory specimens during the acute phase of infection. The lowest concentration of SARS-CoV-2 viral copies this assay can detect is 138 copies/mL. A negative result does not preclude SARS-Cov-2 infection and should not be used as the sole basis for treatment or other patient management decisions. A negative result may occur with  improper specimen collection/handling, submission of specimen other than nasopharyngeal swab, presence of viral mutation(s) within the areas targeted by this assay, and inadequate number of viral copies(<138 copies/mL). A negative result must be  combined with clinical observations, patient history, and epidemiological information. The expected result is Negative.  Fact Sheet for Patients:  BloggerCourse.com  Fact Sheet for Healthcare Providers:  SeriousBroker.it  This test is no t yet approved or cleared by the [place] FDA and  has been authorized for detection and/or diagnosis of SARS-CoV-2 by FDA under an Emergency Use Authorization (EUA). This EUA will remain  in effect (meaning this test can be used) for the duration of the COVID-19 declaration under Section 564(b)(1) of the Act, 21 U.S.C.section 360bbb-3(b)(1), unless the authorization is terminated  or revoked sooner.       Influenza A by PCR NEGATIVE NEGATIVE Final   Influenza B by PCR NEGATIVE NEGATIVE Final    Comment: (NOTE) The Xpert Xpress SARS-CoV-2/FLU/RSV plus assay is intended as an aid in the diagnosis of influenza from Nasopharyngeal swab specimens and should not be used as a sole basis for treatment. Nasal washings and aspirates are unacceptable for Xpert Xpress SARS-CoV-2/FLU/RSV testing.  Fact Sheet for Patients: BloggerCourse.com  Fact Sheet for Healthcare Providers: SeriousBroker.it  This test is not yet approved or cleared by the [place] FDA and has been authorized for detection and/or diagnosis of SARS-CoV-2 by FDA under an Emergency Use Authorization (EUA). This EUA will remain in effect (meaning this test can be used) for the duration of the COVID-19 declaration under Section 564(b)(1) of the Act, 21 U.S.C. section 360bbb-3(b)(1), unless the authorization is terminated or revoked.  Performed at [facility], [address] [place], [place] [address]              Meredeth Ide   [facility] If 7PM-7AM, please contact night-coverage at [email or web address], Office  [phone]   06/16/2020, 5:10 PM   LOS: 0 days

## 2020-06-16 NOTE — Plan of Care (Addendum)
Pt Aox2 at beginning of shift, knew self and that she was at the hospital. Able to take scheduled PM medications with sips of water, no coughing noted. MRI brain completed. IV fluids infusing per MAR. NSR on telemetry, BP softer but within call parameters, remains on 2L [place]. No void overnight, bladder scan then in early AM, MD H. Duncan notified. Pt very sleepy and unable to stay awake long enough to endorse an urge to void. I/O cath performed with clear yellow urine output. Sacral foam placed for skin protection, q2hr turns maintained. Fall/safety/seizure precautions in place.   Problem: Education: Goal: Knowledge of General Education information will improve Description: Including pain rating scale, medication(s)/side effects and non-pharmacologic comfort measures Outcome: Progressing   Problem: Health Behavior/Discharge Planning: Goal: Ability to manage health-related needs will improve Outcome: Progressing   Problem: Clinical Measurements: Goal: Ability to maintain clinical measurements within normal limits will improve Outcome: Progressing Goal: Will remain free from infection Outcome: Progressing Goal: Diagnostic test results will improve Outcome: Progressing Goal: Respiratory complications will improve Outcome: Progressing Goal: Cardiovascular complication will be avoided Outcome: Progressing   Problem: Activity: Goal: Risk for activity intolerance will decrease Outcome: Progressing   Problem: Nutrition: Goal: Adequate nutrition will be maintained Outcome: Progressing   Problem: Coping: Goal: Level of anxiety will decrease Outcome: Progressing   Problem: Elimination: Goal: Will not experience complications related to bowel motility Outcome: Progressing Goal: Will not experience complications related to urinary retention Outcome: Progressing   Problem: Pain Managment: Goal: General experience of comfort will improve Outcome: Progressing   Problem:  Safety: Goal: Ability to remain free from injury will improve Outcome: Progressing   Problem: Skin Integrity: Goal: Risk for impaired skin integrity will decrease Outcome: Progressing

## 2020-06-16 NOTE — Progress Notes (Signed)
eeg done °

## 2020-06-17 DIAGNOSIS — F32A Depression, unspecified: Secondary | ICD-10-CM | POA: Diagnosis present

## 2020-06-17 DIAGNOSIS — R569 Unspecified convulsions: Secondary | ICD-10-CM

## 2020-06-17 DIAGNOSIS — F17210 Nicotine dependence, cigarettes, uncomplicated: Secondary | ICD-10-CM | POA: Diagnosis present

## 2020-06-17 DIAGNOSIS — Z91138 Patient's unintentional underdosing of medication regimen for other reason: Secondary | ICD-10-CM | POA: Diagnosis not present

## 2020-06-17 DIAGNOSIS — F419 Anxiety disorder, unspecified: Secondary | ICD-10-CM | POA: Diagnosis present

## 2020-06-17 DIAGNOSIS — J189 Pneumonia, unspecified organism: Secondary | ICD-10-CM | POA: Diagnosis present

## 2020-06-17 DIAGNOSIS — E785 Hyperlipidemia, unspecified: Secondary | ICD-10-CM | POA: Diagnosis present

## 2020-06-17 DIAGNOSIS — E872 Acidosis: Secondary | ICD-10-CM | POA: Diagnosis present

## 2020-06-17 DIAGNOSIS — Z20822 Contact with and (suspected) exposure to covid-19: Secondary | ICD-10-CM | POA: Diagnosis present

## 2020-06-17 DIAGNOSIS — M797 Fibromyalgia: Secondary | ICD-10-CM | POA: Diagnosis present

## 2020-06-17 DIAGNOSIS — Z886 Allergy status to analgesic agent status: Secondary | ICD-10-CM | POA: Diagnosis not present

## 2020-06-17 DIAGNOSIS — R4182 Altered mental status, unspecified: Secondary | ICD-10-CM | POA: Diagnosis not present

## 2020-06-17 DIAGNOSIS — G4089 Other seizures: Secondary | ICD-10-CM | POA: Diagnosis present

## 2020-06-17 DIAGNOSIS — F1120 Opioid dependence, uncomplicated: Secondary | ICD-10-CM | POA: Diagnosis present

## 2020-06-17 DIAGNOSIS — J440 Chronic obstructive pulmonary disease with acute lower respiratory infection: Secondary | ICD-10-CM | POA: Diagnosis present

## 2020-06-17 DIAGNOSIS — T424X6A Underdosing of benzodiazepines, initial encounter: Secondary | ICD-10-CM | POA: Diagnosis present

## 2020-06-17 DIAGNOSIS — E119 Type 2 diabetes mellitus without complications: Secondary | ICD-10-CM | POA: Diagnosis present

## 2020-06-17 DIAGNOSIS — G928 Other toxic encephalopathy: Secondary | ICD-10-CM | POA: Diagnosis present

## 2020-06-17 DIAGNOSIS — Z79899 Other long term (current) drug therapy: Secondary | ICD-10-CM | POA: Diagnosis not present

## 2020-06-17 DIAGNOSIS — G894 Chronic pain syndrome: Secondary | ICD-10-CM | POA: Diagnosis present

## 2020-06-17 DIAGNOSIS — Z885 Allergy status to narcotic agent status: Secondary | ICD-10-CM | POA: Diagnosis not present

## 2020-06-17 DIAGNOSIS — T426X5A Adverse effect of other antiepileptic and sedative-hypnotic drugs, initial encounter: Secondary | ICD-10-CM | POA: Diagnosis present

## 2020-06-17 DIAGNOSIS — K219 Gastro-esophageal reflux disease without esophagitis: Secondary | ICD-10-CM | POA: Diagnosis present

## 2020-06-17 DIAGNOSIS — I10 Essential (primary) hypertension: Secondary | ICD-10-CM | POA: Diagnosis present

## 2020-06-17 DIAGNOSIS — F13239 Sedative, hypnotic or anxiolytic dependence with withdrawal, unspecified: Secondary | ICD-10-CM | POA: Diagnosis present

## 2020-06-17 DIAGNOSIS — T43595A Adverse effect of other antipsychotics and neuroleptics, initial encounter: Secondary | ICD-10-CM | POA: Diagnosis present

## 2020-06-17 LAB — BASIC METABOLIC PANEL
Anion gap: 10 (ref 5–15)
BUN: 14 mg/dL (ref 8–23)
CO2: 27 mmol/L (ref 22–32)
Calcium: 9 mg/dL (ref 8.9–10.3)
Chloride: 103 mmol/L (ref 98–111)
Creatinine, Ser: 0.66 mg/dL (ref 0.44–1.00)
GFR, Estimated: >60 mL/min (ref >60)
Glucose, Bld: 87 mg/dL (ref 70–99)
Potassium: 4.9 mmol/L (ref 3.5–5.1)
Sodium: 140 mmol/L (ref 135–145)

## 2020-06-17 LAB — CBC
HCT: 39 % (ref 36.0–46.0)
Hemoglobin: 13.3 g/dL (ref 12.0–15.0)
MCH: 31.4 pg (ref 26.0–34.0)
MCHC: 34.1 g/dL (ref 30.0–36.0)
MCV: 92.2 fL (ref 80.0–100.0)
Platelets: 153 10*3/uL (ref 150–400)
RBC: 4.23 MIL/uL (ref 3.87–5.11)
RDW: 12.4 % (ref 11.5–15.5)
WBC: 13.7 10*3/uL — ABNORMAL HIGH (ref 4.0–10.5)
nRBC: 0 % (ref 0.0–0.2)

## 2020-06-17 LAB — GLUCOSE, CAPILLARY
Glucose-Capillary: 71 mg/dL (ref 70–99)
Glucose-Capillary: 81 mg/dL (ref 70–99)
Glucose-Capillary: 94 mg/dL (ref 70–99)
Glucose-Capillary: 78 mg/dL (ref 70–99)
Glucose-Capillary: 60 mg/dL — ABNORMAL LOW (ref 70–99)
Glucose-Capillary: 79 mg/dL (ref 70–99)
Glucose-Capillary: 96 mg/dL (ref 70–99)

## 2020-06-17 MED ORDER — METOPROLOL TARTRATE 5 MG/5ML IV SOLN
2.5000 mg | Freq: Three times a day (TID) | INTRAVENOUS | Status: DC
  Administered 2020-06-17 – 2020-06-21 (×12): 2.5 mg via INTRAVENOUS
  Filled 2020-06-17 (×11): qty 5

## 2020-06-17 NOTE — Progress Notes (Signed)
MD made aware that NT concerned that pt not responding as she was yesterday afternoon, yesterday pt communicating and now pt with decreased responsiveness, only moans with sternal rub, pt did wake enough to take morning meds per primary RN, Per Dr Sharl Ma transfer pt to ICU

## 2020-06-17 NOTE — Progress Notes (Signed)
[facility]  PROGRESS NOTE  Angela Daniel LOV:[id number] DOB: 1955/05/16 DOA: 06/15/2020 PCP: [facility], NP   Brief HPI:   65 year old female with history of hypertension, diabetes mellitus type 2, COPD, nicotine dependence, OSA, GERD, anxiety, depression was brought to the ER by EMS for evaluation of possible seizure episode and mental status change. Patient daughter noted that patient was sleeping in recliner living room and woke up in the morning at 6:30 AM and heard a loud noise in the living room and went to find the mother was having generalized shaking spell and foaming at the mouth. She states that patient had similar episode about 6 weeks ago and was hospitalized.  Her mother has a remote history of seizures and has been on Xanax for years. In the ED CT of the head showed chronic atrophic and ischemic changes, no acute normal to noted.    Subjective   Patient seen and examined, continues to be somnolent.  Opens her eyes to verbal stimuli and tries to communicate.  Gabapentin has been discontinued, Xanax is being weaned off.  Patient was not given p.o. meds due to extreme somnolence.   Assessment/Plan:     Altered mental status-?  Postictal state from seizure versus polypharmacy.  There was questionable witnessed seizure at home as per daughter.  Patient has been started on IV Keppra per neurology.  Patient is also on multiple psychotropic medications including Xanax 2 mg 3 times daily, gabapentin 800 mg 4 times a day, Abilify, buprenorphine Adderall, after discussion with neurologist, gabapentin was discontinued and Xanax is being weaned off.  Adderall has also been discontinued.  P.o. meds have been on hold due to extreme somnolence.     Seizure  ?  Benzodiazepine withdrawal.  Patient is on Xanax for anxiety disorder for long time.  Urine drug screen was negative for benzodiazepine.  Patient has been started on Keppra by neurology.  No definite epileptiform  discharges seen on EEG.  Xanax has been weaned off.   Diabetes mellitus type 2 Metformin on hold, CBG well controlled. Start sliding scale insulin with NovoLog   Lactic acidosis Lactic acid was elevated 2.0, improved to 1.3.  Total CK is 775. Likely from seizure.  Hypomagnesemia Magnesium on admission was 1.6.  We will give mag sulfate 2 g IV x1. Follow serum magnesium level in a.m.  Hypertension Blood pressure is now elevated.  Patient is unable to take p.o. meds.  Will discontinue p.o. metoprolol and start metoprolol 2.5 mg IV every 8 hours.    Scheduled medications:   . alprazolam  2 mg Oral BID  . aspirin EC  81 mg Oral Daily  . buprenorphine  8 mg Sublingual TID  . Chlorhexidine Gluconate Cloth  6 each Topical Daily  . cholecalciferol  2,000 Units Oral Daily  . enoxaparin (LOVENOX) injection  40 mg Subcutaneous Q24H  . fluticasone  1 spray Each Nare QHS  . insulin aspart  0-9 Units Subcutaneous TID WC  . isosorbide mononitrate  30 mg Oral Daily  . metoprolol succinate  50 mg Oral Daily  . mometasone-formoterol  2 puff Inhalation BID  . nicotine  21 mg Transdermal Daily  . pantoprazole  40 mg Oral Daily  . vitamin B-12  2,500 mcg Oral Daily  . vitamin E  400 Units Oral Daily         Data Reviewed:   CBG:  Recent Labs  Lab 06/16/20 1923 06/16/20 2328 06/17/20 0340 06/17/20 0739 06/17/20  1125  GLUCAP 97 88 71 81 94    SpO2: 99 % O2 Flow Rate (L/min): 2 L/min    Vitals:   06/17/20 1100 06/17/20 1200 06/17/20 1300 06/17/20 1400  BP: (!) 150/76 (!) 152/71 (!) 167/75 (!) 154/72  Pulse: (!) 109 99 (!) 109 (!) 102  Resp: 13 12 13 13   Temp:      TempSrc:      SpO2: 95% 97% 96% 99%  Weight:      Height:         Intake/Output Summary (Last 24 hours) at 06/17/2020 1515 Last data filed at 06/17/2020 1335 Gross per 24 hour  Intake --  Output 800 ml  Net -800 ml    05/26 1901 - 05/28 0700 In: 489.9 [I.V.:389.9] Out: 480 [Urine:480]  Filed  Weights   06/15/20 0739  Weight: 63 kg    CBC:  Recent Labs  Lab 06/15/20 0749 06/16/20 0535 06/17/20 0423  WBC 9.0 6.1 13.7*  HGB 12.6 12.3 13.3  HCT 37.4 36.6 39.0  PLT 210 191 153  MCV 92.6 94.6 92.2  MCH 31.2 31.8 31.4  MCHC 33.7 33.6 34.1  RDW 12.7 13.0 12.4  LYMPHSABS 0.7  --   --   MONOABS 0.5  --   --   EOSABS 0.1  --   --   BASOSABS 0.0  --   --     Complete metabolic panel:  Recent Labs  Lab 06/15/20 0749 06/15/20 1032 06/16/20 0535 06/17/20 0423  NA 137  --  141 140  K 3.3*  --  3.7 4.9  CL 94*  --  104 103  CO2 29  --  29 27  GLUCOSE 130*  --  104* 87  BUN 17  --  15 14  CREATININE 1.04*  --  0.66 0.66  CALCIUM 8.9  --  8.4* 9.0  AST 20  --   --   --   ALT 9  --   --   --   ALKPHOS 61  --   --   --   BILITOT 0.4  --   --   --   ALBUMIN 3.6  --   --   --   MG 1.6*  --   --   --   LATICACIDVEN 2.0* 1.3  --   --     No results for input(s): LIPASE, AMYLASE in the last 168 hours.  Recent Labs  Lab 06/15/20 0914  SARSCOV2NAA NEGATIVE    ------------------------------------------------------------------------------------------------------------------ No results for input(s): CHOL, HDL, LDLCALC, TRIG, CHOLHDL, LDLDIRECT in the last 72 hours.  Lab Results  Component Value Date   HGBA1C 6.0 (H) 05/01/2020   ------------------------------------------------------------------------------------------------------------------ No results for input(s): TSH, T4TOTAL, T3FREE, THYROIDAB in the last 72 hours.  Invalid input(s): FREET3 ------------------------------------------------------------------------------------------------------------------ No results for input(s): VITAMINB12, FOLATE, FERRITIN, TIBC, IRON, RETICCTPCT in the last 72 hours.  Coagulation profile No results for input(s): INR, PROTIME in the last 168 hours. No results for input(s): DDIMER in the last 72 hours.  Cardiac Enzymes Recent Labs  Lab 06/15/20 1032  CKTOTAL 775*     ------------------------------------------------------------------------------------------------------------------    Component Value Date/Time   BNP 156.9 (H) 04/30/2020 0605     Antibiotics: Anti-infectives (From admission, onward)   None       Radiology Reports  MR BRAIN W WO CONTRAST  Result Date: 06/15/2020 CLINICAL DATA:  Initial evaluation for acute seizure. EXAM: MRI HEAD WITHOUT AND WITH CONTRAST TECHNIQUE: Multiplanar, multiecho pulse sequences  of the brain and surrounding structures were obtained without and with intravenous contrast. CONTRAST:  48mL GADAVIST GADOBUTROL 1 MMOL/ML IV SOLN COMPARISON:  Prior CT from earlier the same day. FINDINGS: Brain: Examination mildly degraded by motion artifact. Cerebral volume within normal limits for age. Scattered patchy T2/FLAIR hyperintensity seen within the periventricular, deep, and subcortical white matter both cerebral hemispheres, nonspecific, but most commonly related to chronic microvascular ischemic disease. Overall, appearance is moderate in nature. Few small dilated perivascular spaces noted at the inferior basal ganglia bilaterally. No abnormal foci of restricted diffusion to suggest acute or subacute ischemia or changes related to seizure. Gray-white matter differentiation maintained. No encephalomalacia to suggest chronic cortical infarction. No evidence for acute or chronic intracranial hemorrhage. No mass lesion, midline shift or mass effect. No hydrocephalus or extra-axial fluid collection. Pituitary gland and suprasellar region within normal limits. Midline structures intact. No intrinsic temporal lobe abnormality. No abnormal enhancement. Vascular: Right vertebral artery hypoplastic and not well seen. Major intracranial vascular flow voids are otherwise well maintained. Skull and upper cervical spine: Degenerative thickening noted at the tectorial membrane without significant stenosis. Craniocervical junction otherwise  unremarkable. Bone marrow signal intensity within normal limits. Bone marrow signal intensity normal. No scalp soft tissue abnormality. Sinuses/Orbits: Globes and orbital soft tissues demonstrate no acute finding. Paranasal sinuses are clear. Small left mastoid effusion noted, of doubtful significance. Inner ear structures grossly normal. Other: None. IMPRESSION: 1. No acute intracranial abnormality. 2. Moderate T2/FLAIR hyperintensity involving the supratentorial cerebral white matter, nonspecific, but most commonly related to chronic microvascular ischemic disease. Electronically Signed   By: Rise Mu M.D.   On: 06/15/2020 21:38      DVT prophylaxis: Lovenox  Code Status: Full code  Family Communication: No family at bedside   Consultants:  Neurology  Procedures:      Objective    Physical Examination:   General-appears somnolent  Heart-S1-S2, regular, no murmur auscultated  Lungs-clear to auscultation bilaterally, no wheezing or crackles auscultated  Abdomen-soft, nontender, no organomegaly  Extremities-no edema in the lower extremities  Neuro-somnolent, arousable, following commands   Status is: Inpatient  Dispo: The patient is from: Home              Anticipated d/c is to: Home              Anticipated d/c date is: 06/19/2020              Patient currently not stable for discharge  Barrier to discharge-altered mental status, somnolence  COVID-19 Labs  No results for input(s): DDIMER, FERRITIN, LDH, CRP in the last 72 hours.  Lab Results  Component Value Date   SARSCOV2NAA NEGATIVE 06/15/2020   SARSCOV2NAA NEGATIVE 04/30/2020   SARSCOV2NAA NEGATIVE 05/14/2019    Microbiology  Recent Results (from the past 240 hour(s))  Resp Panel by RT-PCR (Flu A&B, Covid)     Status: None   Collection Time: 06/15/20  9:14 AM   Specimen: Nasopharyngeal(NP) swabs in vial transport medium  Result Value Ref Range Status   SARS Coronavirus 2 by RT PCR  NEGATIVE NEGATIVE Final    Comment: (NOTE) SARS-CoV-2 target nucleic acids are NOT DETECTED.  The SARS-CoV-2 RNA is generally detectable in upper respiratory specimens during the acute phase of infection. The lowest concentration of SARS-CoV-2 viral copies this assay can detect is 138 copies/mL. A negative result does not preclude SARS-Cov-2 infection and should not be used as the sole basis for treatment or other patient management decisions. A  negative result may occur with  improper specimen collection/handling, submission of specimen other than nasopharyngeal swab, presence of viral mutation(s) within the areas targeted by this assay, and inadequate number of viral copies(<138 copies/mL). A negative result must be combined with clinical observations, patient history, and epidemiological information. The expected result is Negative.  Fact Sheet for Patients:  BloggerCourse.comhttps://www.fda.gov/media/152166/download  Fact Sheet for Healthcare Providers:  SeriousBroker.ithttps://www.fda.gov/media/152162/download  This test is no t yet approved or cleared by the [place] FDA and  has been authorized for detection and/or diagnosis of SARS-CoV-2 by FDA under an Emergency Use Authorization (EUA). This EUA will remain  in effect (meaning this test can be used) for the duration of the COVID-19 declaration under Section 564(b)(1) of the Act, 21 U.S.C.section 360bbb-3(b)(1), unless the authorization is terminated  or revoked sooner.       Influenza A by PCR NEGATIVE NEGATIVE Final   Influenza B by PCR NEGATIVE NEGATIVE Final    Comment: (NOTE) The Xpert Xpress SARS-CoV-2/FLU/RSV plus assay is intended as an aid in the diagnosis of influenza from Nasopharyngeal swab specimens and should not be used as a sole basis for treatment. Nasal washings and aspirates are unacceptable for Xpert Xpress SARS-CoV-2/FLU/RSV testing.  Fact Sheet for Patients: BloggerCourse.comhttps://www.fda.gov/media/152166/download  Fact Sheet for  Healthcare Providers: SeriousBroker.ithttps://www.fda.gov/media/152162/download  This test is not yet approved or cleared by the [place] FDA and has been authorized for detection and/or diagnosis of SARS-CoV-2 by FDA under an Emergency Use Authorization (EUA). This EUA will remain in effect (meaning this test can be used) for the duration of the COVID-19 declaration under Section 564(b)(1) of the Act, 21 U.S.C. section 360bbb-3(b)(1), unless the authorization is terminated or revoked.  Performed at [facility], [address] [place], [place] [address]              Angela IdeGagan S Jakelyn Daniel   [facility] If 7PM-7AM, please contact night-coverage at [email or web address], Office  [phone]   06/17/2020, 3:15 PM  LOS: 0 days

## 2020-06-17 NOTE — Progress Notes (Signed)
Neurology Progress Note  Subjective: - Somnolence improved after reduction in sedating and deliriogenic medications - Opens eyes to voice today and will follow simple commands - EEG performed yesterday 2/2 somnolence, showed mild diffuse slowing but no seizure activity  Exam: Vitals:   06/17/20 1700 06/17/20 1800  BP: 140/65 (!) 141/61  Pulse: 75 83  Resp: 10 15  Temp:    SpO2: 100% 100%   Gen: In bed, somnolent Resp: non-labored breathing, no acute distress Abd: soft, nt  Neuro: MS: lethargic but opens eyes to voice, follows simple commands Speech: mild dysarthria, no aphasia YO:VZCHY, EOMI, blinks to threat bilat, sensation intact, face symmetric, hearing intact to voice Motor: moves all extremities on command but will not participate with formal strength exam Sensory: SILT Reflexes: 2+ symmetric throughout, toes down  Impression: 65 yo woman with hx benzodiazepine w/d seizures admitted with encephalopathy and polypharmacy  Recommendations: 1) Agree with d/c gabapentin (while this is an AED she is now covered by keppra) 2) Agree w/ d/c stimulants 3) Weaning xanax, currently at 2mg  bid. Do not abruptly discontinue as this could precipitate withdrawal seizure 4) Ok from neuro standpoint to transfer out of ICU  , MD [facility] [phone]  If 7pm- 7am, please page neurology on call as listed in AMION.

## 2020-06-17 NOTE — Procedures (Signed)
Routine EEG Report  Angela Daniel is a 65 y.o. female with a history of encephalopathy and withdrawal seizures who is undergoing an EEG to evaluate for seizures.  Report: This EEG was acquired with electrodes placed according to the International 10-20 electrode system (including Fp1, Fp2, F3, F4, C3, C4, P3, P4, O1, O2, T3, T4, T5, T6, A1, A2, Fz, Cz, Pz). The following electrodes were missing or displaced: none.  The background was primarily composed of activity in the 6-8 Hz range. There was no clear awake recording and no clear posterior dominant rhythm.This activity is reactive to stimulation. Drowsiness was manifested by background fragmentation; sleep spindles signified deeper sleep. There was no focal slowing. There were no interictal epileptiform discharges. There were no electrographic seizures identified. There was no abnormal response to photic stimulation. Hyperventilation was not performed.  Impression and clinical correlation: This EEG was obtained while somnolent and was abnoraml due to mild diffuse slowing signaling global cerebral dysfunction.  Bing Neighbors, MD [facility] [phone]  If 7pm- 7am, please page neurology on call as listed in AMION.

## 2020-06-17 NOTE — Progress Notes (Signed)
Pt remains on 2L [place]. NaCl in KCL continues to infuse @ 75 ml/hr. Pt voiding via purewick.   Dr. Sharl Ma notified of pt's neuro status and PO meds were held this AM.   Over course of day, pt progressively awakening. POP Subutex was given this afternoon.

## 2020-06-18 DIAGNOSIS — I10 Essential (primary) hypertension: Secondary | ICD-10-CM | POA: Diagnosis not present

## 2020-06-18 DIAGNOSIS — R569 Unspecified convulsions: Secondary | ICD-10-CM | POA: Diagnosis not present

## 2020-06-18 DIAGNOSIS — E119 Type 2 diabetes mellitus without complications: Secondary | ICD-10-CM | POA: Diagnosis not present

## 2020-06-18 LAB — COMPREHENSIVE METABOLIC PANEL
ALT: 82 U/L — ABNORMAL HIGH (ref 0–44)
AST: 244 U/L — ABNORMAL HIGH (ref 15–41)
Albumin: 3.3 g/dL — ABNORMAL LOW (ref 3.5–5.0)
Alkaline Phosphatase: 99 U/L (ref 38–126)
Anion gap: 9 (ref 5–15)
BUN: 13 mg/dL (ref 8–23)
CO2: 27 mmol/L (ref 22–32)
Calcium: 8.9 mg/dL (ref 8.9–10.3)
Chloride: 101 mmol/L (ref 98–111)
Creatinine, Ser: 0.56 mg/dL (ref 0.44–1.00)
GFR, Estimated: >60 mL/min (ref >60)
Glucose, Bld: 94 mg/dL (ref 70–99)
Potassium: 4.5 mmol/L (ref 3.5–5.1)
Sodium: 137 mmol/L (ref 135–145)
Total Bilirubin: 0.9 mg/dL (ref 0.3–1.2)
Total Protein: 6.6 g/dL (ref 6.5–8.1)

## 2020-06-18 LAB — CBC
HCT: 38.2 % (ref 36.0–46.0)
Hemoglobin: 12.8 g/dL (ref 12.0–15.0)
MCH: 31.8 pg (ref 26.0–34.0)
MCHC: 33.5 g/dL (ref 30.0–36.0)
MCV: 94.8 fL (ref 80.0–100.0)
Platelets: 149 10*3/uL — ABNORMAL LOW (ref 150–400)
RBC: 4.03 MIL/uL (ref 3.87–5.11)
RDW: 12 % (ref 11.5–15.5)
WBC: 6.4 10*3/uL (ref 4.0–10.5)
nRBC: 0 % (ref 0.0–0.2)

## 2020-06-18 LAB — MAGNESIUM: Magnesium: 1.5 mg/dL — ABNORMAL LOW (ref 1.7–2.4)

## 2020-06-18 LAB — GLUCOSE, CAPILLARY
Glucose-Capillary: 81 mg/dL (ref 70–99)
Glucose-Capillary: 100 mg/dL — ABNORMAL HIGH (ref 70–99)
Glucose-Capillary: 61 mg/dL — ABNORMAL LOW (ref 70–99)
Glucose-Capillary: 108 mg/dL — ABNORMAL HIGH (ref 70–99)
Glucose-Capillary: 113 mg/dL — ABNORMAL HIGH (ref 70–99)
Glucose-Capillary: 140 mg/dL — ABNORMAL HIGH (ref 70–99)

## 2020-06-18 MED ORDER — DEXTROSE 50 % IV SOLN
INTRAVENOUS | Status: AC
  Filled 2020-06-18: qty 50

## 2020-06-18 MED ORDER — MAGNESIUM SULFATE 2 GM/50ML IV SOLN
2.0000 g | Freq: Once | INTRAVENOUS | Status: AC
  Administered 2020-06-18: 2 g via INTRAVENOUS
  Filled 2020-06-18: qty 50

## 2020-06-18 MED ORDER — ACETAMINOPHEN 325 MG PO TABS
650.0000 mg | ORAL_TABLET | Freq: Four times a day (QID) | ORAL | Status: DC | PRN
  Administered 2020-06-18 – 2020-06-22 (×3): 650 mg via ORAL
  Filled 2020-06-18 (×4): qty 2

## 2020-06-18 MED ORDER — DEXTROSE 50 % IV SOLN
1.0000 | Freq: Once | INTRAVENOUS | Status: AC
  Administered 2020-06-18: 50 mL via INTRAVENOUS

## 2020-06-18 MED ORDER — SODIUM CHLORIDE 0.9 % IV SOLN: INTRAVENOUS | Status: DC

## 2020-06-18 MED ORDER — ALPRAZOLAM 1 MG PO TABS
2.0000 mg | ORAL_TABLET | Freq: Two times a day (BID) | ORAL | Status: DC | PRN
  Administered 2020-06-18: 2 mg via ORAL
  Filled 2020-06-18 (×3): qty 2

## 2020-06-18 NOTE — Progress Notes (Signed)
Report given to Melissa, RN

## 2020-06-18 NOTE — Progress Notes (Signed)
CBG 61. Attempted to give patient some orange juice but was unable to drink very much due to drowsiness. 1 amp of D50 administered. Will recheck CBG in 30 minutes.

## 2020-06-18 NOTE — Progress Notes (Signed)
Pt transferred to [facility] 105 at this time. VSS prior to transfer.

## 2020-06-18 NOTE — Progress Notes (Signed)
[facility]  PROGRESS NOTE  Angela LovelessSara Jean Daniel ZOX:[id number] DOB: 05/16/1955 DOA: 06/15/2020 PCP: [facility], NP   Brief HPI:   65 year old female with history of hypertension, diabetes mellitus type 2, COPD, nicotine dependence, OSA, GERD, anxiety, depression was brought to the ER by EMS for evaluation of possible seizure episode and mental status change. Patient daughter noted that patient was sleeping in recliner living room and woke up in the morning at 6:30 AM and heard a loud noise in the living room and went to find the mother was having generalized shaking spell and foaming at the mouth. She states that patient had similar episode about 6 weeks ago and was hospitalized.  Her mother has a remote history of seizures and has been on Xanax for years. In the ED CT of the head showed chronic atrophic and ischemic changes, no acute normal to noted.    Subjective   Patient seen and examined, much more awake this morning.  Answering questions appropriately.  Says that she is hungry.  As per RN she was not given Xanax since she came to ICU.  Gabapentin was earlier discontinued.   Assessment/Plan:     Altered mental status ? Postictal state from seizure versus polypharmacy.  There was questionable witnessed seizure at home as per daughter.  Patient has been started on IV Keppra per neurology.  Patient is also on multiple psychotropic medications including Xanax 2 mg 3 times daily, gabapentin 800 mg 4 times a day, Abilify, buprenorphine Adderall, after discussion with neurologist, gabapentin was discontinued and Xanax is being weaned off.  Adderall has also been discontinued.  P.o. meds have been on hold due to extreme somnolence.  Will change Xanax to 2 mg p.o. twice daily as needed.   Seizure  ?  Benzodiazepine withdrawal.  Patient is on Xanax for anxiety disorder for long time.  Urine drug screen was negative for benzodiazepine.  Patient has been started on Keppra by  neurology.  No definite epileptiform discharges seen on EEG.  Xanax is being weaned off.   Diabetes mellitus type 2 Metformin on hold, CBG low since last night. Continue sliding scale insulin NovoLog. We will start her on regular diet.   Lactic acidosis Lactic acid was elevated 2.0, improved to 1.3.  Total CK is 775. Likely from seizure.  Hypomagnesemia Magnesium still low at 1.5.  Will give additional magnesium sulfate 2 g IV x1. Follow serum magnesium level in a.m.  Hypertension Blood pressure is stable, as patient was not taking p.o. meds, she was started on metoprolol 2.5 mg IV every 8 hours scheduled.  Continue to monitor.    Opiate dependence continue buprenorphine 8 mg 3 times daily    Scheduled medications:   . aspirin EC  81 mg Oral Daily  . buprenorphine  8 mg Sublingual TID  . Chlorhexidine Gluconate Cloth  6 each Topical Daily  . cholecalciferol  2,000 Units Oral Daily  . dextrose      . enoxaparin (LOVENOX) injection  40 mg Subcutaneous Q24H  . fluticasone  1 spray Each Nare QHS  . insulin aspart  0-9 Units Subcutaneous TID WC  . isosorbide mononitrate  30 mg Oral Daily  . metoprolol tartrate  2.5 mg Intravenous Q8H  . mometasone-formoterol  2 puff Inhalation BID  . nicotine  21 mg Transdermal Daily  . pantoprazole  40 mg Oral Daily  . vitamin B-12  2,500 mcg Oral Daily  . vitamin E  400 Units Oral Daily  Data Reviewed:   CBG:  Recent Labs  Lab 06/17/20 2035 06/17/20 2329 06/18/20 0405 06/18/20 0733 06/18/20 0829  GLUCAP 79 96 81 61* 100*    SpO2: 97 % O2 Flow Rate (L/min): 2 L/min    Vitals:   06/18/20 0700 06/18/20 0800 06/18/20 0900 06/18/20 1000  BP: (!) 166/75 (!) 165/78 (!) 168/92 (!) 141/76  Pulse: 94 99 95 99  Resp: 11 15 11 17   Temp:  97.9 F (36.6 C)    TempSrc:  Axillary    SpO2: 93% 98% 95% 97%  Weight:      Height:         Intake/Output Summary (Last 24 hours) at 06/18/2020 1112 Last data filed at  06/18/2020 0825 Gross per 24 hour  Intake 4454.06 ml  Output 2750 ml  Net 1704.06 ml    05/27 1901 - 05/29 0700 In: -  Out: 2300 [Urine:2300]  Filed Weights   06/15/20 0739  Weight: 63 kg    CBC:  Recent Labs  Lab 06/15/20 0749 06/16/20 0535 06/17/20 0423 06/18/20 0448  WBC 9.0 6.1 13.7* 6.4  HGB 12.6 12.3 13.3 12.8  HCT 37.4 36.6 39.0 38.2  PLT 210 191 153 149*  MCV 92.6 94.6 92.2 94.8  MCH 31.2 31.8 31.4 31.8  MCHC 33.7 33.6 34.1 33.5  RDW 12.7 13.0 12.4 12.0  LYMPHSABS 0.7  --   --   --   MONOABS 0.5  --   --   --   EOSABS 0.1  --   --   --   BASOSABS 0.0  --   --   --     Complete metabolic panel:  Recent Labs  Lab 06/15/20 0749 06/15/20 1032 06/16/20 0535 06/17/20 0423 06/18/20 0448  NA 137  --  141 140 137  K 3.3*  --  3.7 4.9 4.5  CL 94*  --  104 103 101  CO2 29  --  29 27 27   GLUCOSE 130*  --  104* 87 94  BUN 17  --  15 14 13   CREATININE 1.04*  --  0.66 0.66 0.56  CALCIUM 8.9  --  8.4* 9.0 8.9  AST 20  --   --   --  244*  ALT 9  --   --   --  82*  ALKPHOS 61  --   --   --  99  BILITOT 0.4  --   --   --  0.9  ALBUMIN 3.6  --   --   --  3.3*  MG 1.6*  --   --   --  1.5*  LATICACIDVEN 2.0* 1.3  --   --   --     No results for input(s): LIPASE, AMYLASE in the last 168 hours.  Recent Labs  Lab 06/15/20 0914  SARSCOV2NAA NEGATIVE    ------------------------------------------------------------------------------------------------------------------ No results for input(s): CHOL, HDL, LDLCALC, TRIG, CHOLHDL, LDLDIRECT in the last 72 hours.  Lab Results  Component Value Date   HGBA1C 6.0 (H) 05/01/2020   ------------------------------------------------------------------------------------------------------------------ No results for input(s): TSH, T4TOTAL, T3FREE, THYROIDAB in the last 72 hours.  Invalid input(s): FREET3 ------------------------------------------------------------------------------------------------------------------ No  results for input(s): VITAMINB12, FOLATE, FERRITIN, TIBC, IRON, RETICCTPCT in the last 72 hours.  Coagulation profile No results for input(s): INR, PROTIME in the last 168 hours. No results for input(s): DDIMER in the last 72 hours.  Cardiac Enzymes Recent Labs  Lab 06/15/20 1032  CKTOTAL 775*    ------------------------------------------------------------------------------------------------------------------  Component Value Date/Time   BNP 156.9 (H) 04/30/2020 2585     Antibiotics: Anti-infectives (From admission, onward)   None       Radiology Reports  EEG adult  Result Date: 06/17/2020 Jefferson Fuel, MD     06/17/2020 10:44 PM Routine EEG Report Angela Daniel is a 65 y.o. female with a history of encephalopathy and withdrawal seizures who is undergoing an EEG to evaluate for seizures. Report: This EEG was acquired with electrodes placed according to the International 10-20 electrode system (including Fp1, Fp2, F3, F4, C3, C4, P3, P4, O1, O2, T3, T4, T5, T6, A1, A2, Fz, Cz, Pz). The following electrodes were missing or displaced: none. The background was primarily composed of activity in the 6-8 Hz range. There was no clear awake recording and no clear posterior dominant rhythm.This activity is reactive to stimulation. Drowsiness was manifested by background fragmentation; sleep spindles signified deeper sleep. There was no focal slowing. There were no interictal epileptiform discharges. There were no electrographic seizures identified. There was no abnormal response to photic stimulation. Hyperventilation was not performed. Impression and clinical correlation: This EEG was obtained while somnolent and was abnoraml due to mild diffuse slowing signaling global cerebral dysfunction. Bing Neighbors, MD [facility] [phone] If 7pm- 7am, please page neurology on call as listed in AMION.      DVT prophylaxis: Lovenox  Code Status: Full code  Family  Communication: Discussed with daughter at bedside   Consultants:  Neurology  Procedures:      Objective    Physical Examination:   General-appears in no acute distress  Heart-S1-S2, regular, no murmur auscultated  Lungs-clear to auscultation bilaterally, no wheezing or crackles auscultated  Abdomen-soft, nontender, no organomegaly  Extremities-no edema in the lower extremities  Neuro-alert, oriented x3, no focal deficit noted   Status is: Inpatient  Dispo: The patient is from: Home              Anticipated d/c is to: Home              Anticipated d/c date is: 06/19/2020              Patient currently not stable for discharge  Barrier to discharge-altered mental status, somnolence  COVID-19 Labs  No results for input(s): DDIMER, FERRITIN, LDH, CRP in the last 72 hours.  Lab Results  Component Value Date   SARSCOV2NAA NEGATIVE 06/15/2020   SARSCOV2NAA NEGATIVE 04/30/2020   SARSCOV2NAA NEGATIVE 05/14/2019    Microbiology  Recent Results (from the past 240 hour(s))  Resp Panel by RT-PCR (Flu A&B, Covid)     Status: None   Collection Time: 06/15/20  9:14 AM   Specimen: Nasopharyngeal(NP) swabs in vial transport medium  Result Value Ref Range Status   SARS Coronavirus 2 by RT PCR NEGATIVE NEGATIVE Final    Comment: (NOTE) SARS-CoV-2 target nucleic acids are NOT DETECTED.  The SARS-CoV-2 RNA is generally detectable in upper respiratory specimens during the acute phase of infection. The lowest concentration of SARS-CoV-2 viral copies this assay can detect is 138 copies/mL. A negative result does not preclude SARS-Cov-2 infection and should not be used as the sole basis for treatment or other patient management decisions. A negative result may occur with  improper specimen collection/handling, submission of specimen other than nasopharyngeal swab, presence of viral mutation(s) within the areas targeted by this assay, and inadequate number of  viral copies(<138 copies/mL). A negative result must be combined with clinical observations, patient history, and epidemiological  information. The expected result is Negative.  Fact Sheet for Patients:  BloggerCourse.com  Fact Sheet for Healthcare Providers:  SeriousBroker.it  This test is no t yet approved or cleared by the [place] FDA and  has been authorized for detection and/or diagnosis of SARS-CoV-2 by FDA under an Emergency Use Authorization (EUA). This EUA will remain  in effect (meaning this test can be used) for the duration of the COVID-19 declaration under Section 564(b)(1) of the Act, 21 U.S.C.section 360bbb-3(b)(1), unless the authorization is terminated  or revoked sooner.       Influenza A by PCR NEGATIVE NEGATIVE Final   Influenza B by PCR NEGATIVE NEGATIVE Final    Comment: (NOTE) The Xpert Xpress SARS-CoV-2/FLU/RSV plus assay is intended as an aid in the diagnosis of influenza from Nasopharyngeal swab specimens and should not be used as a sole basis for treatment. Nasal washings and aspirates are unacceptable for Xpert Xpress SARS-CoV-2/FLU/RSV testing.  Fact Sheet for Patients: BloggerCourse.com  Fact Sheet for Healthcare Providers: SeriousBroker.it  This test is not yet approved or cleared by the [place] FDA and has been authorized for detection and/or diagnosis of SARS-CoV-2 by FDA under an Emergency Use Authorization (EUA). This EUA will remain in effect (meaning this test can be used) for the duration of the COVID-19 declaration under Section 564(b)(1) of the Act, 21 U.S.C. section 360bbb-3(b)(1), unless the authorization is terminated or revoked.  Performed at [facility], [address] [place], [place] [address]         Meredeth Ide   [facility] If 7PM-7AM, please contact night-coverage at  [email or web address], Office  [phone]   06/18/2020, 11:12 AM  LOS: 1 day

## 2020-06-19 ENCOUNTER — Inpatient Hospital Stay: Payer: Medicare Other

## 2020-06-19 DIAGNOSIS — E119 Type 2 diabetes mellitus without complications: Secondary | ICD-10-CM | POA: Diagnosis not present

## 2020-06-19 DIAGNOSIS — R4182 Altered mental status, unspecified: Secondary | ICD-10-CM | POA: Diagnosis not present

## 2020-06-19 DIAGNOSIS — R569 Unspecified convulsions: Secondary | ICD-10-CM | POA: Diagnosis not present

## 2020-06-19 DIAGNOSIS — I10 Essential (primary) hypertension: Secondary | ICD-10-CM | POA: Diagnosis not present

## 2020-06-19 LAB — GLUCOSE, CAPILLARY
Glucose-Capillary: 100 mg/dL — ABNORMAL HIGH (ref 70–99)
Glucose-Capillary: 107 mg/dL — ABNORMAL HIGH (ref 70–99)
Glucose-Capillary: 116 mg/dL — ABNORMAL HIGH (ref 70–99)
Glucose-Capillary: 130 mg/dL — ABNORMAL HIGH (ref 70–99)
Glucose-Capillary: 145 mg/dL — ABNORMAL HIGH (ref 70–99)
Glucose-Capillary: 91 mg/dL (ref 70–99)

## 2020-06-19 LAB — MAGNESIUM: Magnesium: 1.9 mg/dL (ref 1.7–2.4)

## 2020-06-19 MED ORDER — ALPRAZOLAM 0.5 MG PO TABS
0.5000 mg | ORAL_TABLET | Freq: Four times a day (QID) | ORAL | Status: DC
  Administered 2020-06-21: 0.5 mg via ORAL
  Filled 2020-06-19 (×2): qty 1

## 2020-06-19 MED ORDER — ALPRAZOLAM 1 MG PO TABS: 1.0000 mg | ORAL_TABLET | Freq: Two times a day (BID) | ORAL | Status: DC | PRN

## 2020-06-19 MED ORDER — GUAIFENESIN-DM 100-10 MG/5ML PO SYRP
5.0000 mL | ORAL_SOLUTION | ORAL | Status: DC | PRN
  Administered 2020-06-20: 5 mL via ORAL
  Filled 2020-06-19: qty 5

## 2020-06-19 MED ORDER — MAGNESIUM SULFATE 4 GM/100ML IV SOLN
4.0000 g | Freq: Once | INTRAVENOUS | Status: DC
  Filled 2020-06-19: qty 100

## 2020-06-19 NOTE — Progress Notes (Signed)
Neurology attempted visit note  Came to evaluate patient today but was unable to do so 2/2 pt being OTF. Will f/u tmrw.  Bing Neighbors, MD [facility] [phone]  If 7pm- 7am, please page neurology on call as listed in AMION.

## 2020-06-19 NOTE — Progress Notes (Addendum)
Neurology Progress Note   S:// Seen and examined. No acute changes overnight. Brother Joey at bedside.  Reports that she is in in and out of her mentation.   O:// Current vital signs: BP 131/72 (BP Location: Right Arm)   Pulse 61   Temp 97.7 F (36.5 C)   Resp 16   Ht 5\' 3"  (1.6 m)   Wt 63 kg   SpO2 100%   BMI 24.60 kg/m  Vital signs in last 24 hours: Temp:  [97.5 F (36.4 C)-98.7 F (37.1 C)] 97.7 F (36.5 C) (05/30 0754) Pulse Rate:  [61-119] 61 (05/30 0754) Resp:  [12-18] 16 (05/30 0754) BP: (111-164)/(69-106) 131/72 (05/30 0754) SpO2:  [94 %-100 %] 100 % (05/30 0754) General: Sleeping comfortably no distress HEENT: Normocephalic/atraumatic Respiratory: Clear Cardiovascular: Regular rate rhythm Abdomen soft nondistended nontender Extremities warm well perfused Neurological exam She is drowsy, opens eyes to voice, follows commands. Is able to tell me her name and the fact that she is in the hospital. Poor attention concentration Cranial nerves II to XII intact Motor examination: Able to lift both upper extremities antigravity although all her actions are very slow.  Is able to wiggle toes to command and reports that she is having back pain that makes moving legs difficult. Sensory exam: Intact to touch Coord: difficult to assess   Medications  Current Facility-Administered Medications:  .  0.9 %  sodium chloride infusion, , Intravenous, Continuous, 11[birth year]-1982, MD, Last Rate: 75 mL/hr at 06/19/20 0203, New Bag at 06/19/20 0203 .  acetaminophen (TYLENOL) tablet 650 mg, 650 mg, Oral, Q6H PRN, 0204, MD, 650 mg at 06/18/20 2328 .  aspirin EC tablet 81 mg, 81 mg, Oral, Daily, 2329, Gagan S, MD, 81 mg at 06/18/20 0936 .  buprenorphine (SUBUTEX) SL tablet 8 mg, 8 mg, Sublingual, TID, 06/20/20, Sharl Ma, MD, 8 mg at 06/18/20 2207 .  Chlorhexidine Gluconate Cloth 2 % PADS 6 each, 6 each, Topical, Daily, 2208, MD, 6 each at 06/17/20 1023 .  cholecalciferol  (VITAMIN D3) tablet 2,000 Units, 2,000 Units, Oral, Daily, 06/19/20, MD, 2,000 Units at 06/18/20 0935 .  enoxaparin (LOVENOX) injection 40 mg, 40 mg, Subcutaneous, Q24H, 06/20/20, Sharl Ma, MD, 40 mg at 06/18/20 2202 .  fluticasone (FLONASE) 50 MCG/ACT nasal spray 1 spray, 1 spray, Each Nare, QHS, 2203, MD, 1 spray at 06/17/20 2104 .  insulin aspart (novoLOG) injection 0-9 Units, 0-9 Units, Subcutaneous, TID WC, Lama, 2105, MD .  isosorbide mononitrate (IMDUR) 24 hr tablet 30 mg, 30 mg, Oral, Daily, Sarina Ill, Sharl Ma, MD, 30 mg at 06/18/20 0934 .  [COMPLETED] levETIRAcetam (KEPPRA) 1,260 mg in sodium chloride 0.9 % 100 mL IVPB, 20 mg/kg, Intravenous, Once, Stopped at 06/15/20 1447 **FOLLOWED BY** levETIRAcetam (KEPPRA) IVPB 500 mg/100 mL premix, 500 mg, Intravenous, Q12H, 06/17/20, Sharl Ma, MD, Last Rate: 400 mL/hr at 06/19/20 0205, 500 mg at 06/19/20 0205 .  LORazepam (ATIVAN) injection 2 mg, 2 mg, Intravenous, Q4H PRN, 06/21/20, Gagan S, MD .  magnesium sulfate IVPB 4 g 100 mL, 4 g, Intravenous, Once, Sharl Ma, Gagan S, MD .  metoprolol tartrate (LOPRESSOR) injection 2.5 mg, 2.5 mg, Intravenous, Q8H, 02-10-1977, Sharl Ma, MD, 2.5 mg at 06/19/20 0502 .  mometasone-formoterol (DULERA) 200-5 MCG/ACT inhaler 2 puff, 2 puff, Inhalation, BID, 06/21/20, Sharl Ma, MD, 2 puff at 06/18/20 0755 .  nicotine (NICODERM CQ - dosed in mg/24 hours) patch 21 mg, 21 mg, Transdermal, Daily,  Meredeth Ide, MD, 21 mg at 06/18/20 [id number] .  ondansetron (ZOFRAN) tablet 4 mg, 4 mg, Oral, Q6H PRN **OR** ondansetron (ZOFRAN) injection 4 mg, 4 mg, Intravenous, Q6H PRN, Sharl Ma, Sarina Ill, MD .  pantoprazole (PROTONIX) EC tablet 40 mg, 40 mg, Oral, Daily, Sharl Ma, Sarina Ill, MD, 40 mg at 06/18/20 0935 .  vitamin B-12 (CYANOCOBALAMIN) tablet 2,500 mcg, 2,500 mcg, Oral, Daily, Meredeth Ide, MD, 2,500 mcg at 06/18/20 0935 .  vitamin E capsule 400 Units, 400 Units, Oral, Daily, Meredeth Ide, MD, 400 Units at 06/18/20 0934 Labs CBC    Component Value  Date/Time   WBC 6.4 06/18/2020 0448   RBC 4.03 06/18/2020 0448   HGB 12.8 06/18/2020 0448   HGB 12.9 06/28/2012 2218   HCT 38.2 06/18/2020 0448   HCT 37.3 06/28/2012 2218   PLT 149 (L) 06/18/2020 0448   PLT 218 06/28/2012 2218   MCV 94.8 06/18/2020 0448   MCV 91 06/28/2012 2218   MCH 31.8 06/18/2020 0448   MCHC 33.5 06/18/2020 0448   RDW 12.0 06/18/2020 0448   RDW 13.2 06/28/2012 2218   LYMPHSABS 0.7 06/15/2020 0749   MONOABS 0.5 06/15/2020 0749   EOSABS 0.1 06/15/2020 0749   BASOSABS 0.0 06/15/2020 0749    CMP     Component Value Date/Time   NA 137 06/18/2020 0448   NA 139 06/28/2012 2218   K 4.5 06/18/2020 0448   K 3.7 06/28/2012 2218   CL 101 06/18/2020 0448   CL 108 (H) 06/28/2012 2218   CO2 27 06/18/2020 0448   CO2 25 06/28/2012 2218   GLUCOSE 94 06/18/2020 0448   GLUCOSE 118 (H) 06/28/2012 2218   BUN 13 06/18/2020 0448   BUN 9 06/28/2012 2218   CREATININE 0.56 06/18/2020 0448   CREATININE 0.73 06/28/2012 2218   CALCIUM 8.9 06/18/2020 0448   CALCIUM 8.8 06/28/2012 2218   PROT 6.6 06/18/2020 0448   ALBUMIN 3.3 (L) 06/18/2020 0448   AST 244 (H) 06/18/2020 0448   ALT 82 (H) 06/18/2020 0448   ALKPHOS 99 06/18/2020 0448   BILITOT 0.9 06/18/2020 0448   GFRNONAA >60 06/18/2020 0448   GFRNONAA >60 06/28/2012 2218   GFRAA >60 07/27/2019 2323   GFRAA >60 06/28/2012 2218   EEG:    Imaging I have reviewed images in epic and the results pertinent to this consultation are: CT head with no acute changes MRI brain with and without contrast-no acute intracranial abnormality.  Moderate T2/flair hyperintensity in the cerebral white matter-nonspecific but most commonly related to chronic microvascular ischemic disease.  Assessment:  65 year old woman with a history of benzodiazepine withdrawal seizures admitted with encephalopathy.  Urinary toxicology screen negative for benzodiazepine. Multifactorial toxic metabolic encephalopathy likely secondary to benzodiazepine  withdrawal-?  If there was a seizure as on presentation she had some foaming at the mouth.  Impression:  Multifactorial toxic metabolic encephalopathy  Benzodiazepine withdrawal seizure  Polypharmacy  Recommendations:  Agree with discontinuing gabapentin-as she is currently on Keppra.  Also agree with discontinuing stimulants.  As previously recommended by Dr. Selina Cooley, I would taper his Xanax slowly- reportedly on 2 mg twice daily at home.  I would do a total of 2 mg a day in 4 divided doses-0.5 mg every 6 hours.  Goal of this is to avoid any withdrawal seizures.  Continues to remain encephalopathic due to pain.  Pain management per primary team  Plan discussed with Dr. Sharl Ma over secure chat  -- Milon Dikes, MD Neurologist  [facility] Pager: [phone]

## 2020-06-19 NOTE — Consult Note (Signed)
NEUROLOGY CONSULTATION NOTE   Date of service: Jun 19, 2020 Patient Name: Angela Daniel MRN:  [id number] DOB:  04-18-1955 Reason for consult: encephalopathy, sz activity _ _ _   _ __   _ __ _ _  __ __   _ __   __ _  History of Present Illness   Patient is a 65 year old female who was brought into the ER by EMS for evaluation after she had a suspected seizure episode at home and became somnolent thereafter.  She was admitted to the hospital 6 weeks ago for similar incident and had an EEG which showed mild to moderate generalized slowing, nonspecific finding consistent with a generalized disturbance of cerebral function including toxic metabolic or structural abnormalities that are multifocal or diffuse.  No definite epileptiform discharges or electrographic seizures noted. When admitted by the hospitalist she was per report more awake and oriented to person and place but not back to baseline and thus admitted to observation status. On my examination she refuses to answer orientation questions or open her eyes and resists examination. I am unable to obtain any more history from her and family is currently unavailable for collateral. Hospitalist was able to speak with daughter earlier; per her H&P:  "Angela Daniel is a 65 y.o. female with medical history significant for hypertension, diabetes mellitus, COPD, nicotine dependence, obstructive sleep apnea, GERD, anxiety and depression who was brought into the ER by EMS for evaluation of ??  Seizure episode and mental status changes. Patient's daughter states that she was in her usual state of health and had gone to bed last night.  Patient usually sleeps in a recliner in the living room.  She woke up this morning at about 6:30 AM and heard a loud noise in the living room and went intact to find her mother having generalized shaking spell and foaming at the mouth. No incontinence of urine or feces. She states that patient had a similar episode about 6  weeks ago and was hospitalized.  She also states that her mother has a remote history of seizures and has been on Xanax for years.   Patient's daughter states that after the seizure episode the patient became somnolent and so she called 911.  She also states that patient was recently placed on Levaquin for community-acquired pneumonia. Patient is more awake and alert and is oriented to person and place but is unable to provide any history. Labs show sodium 137, potassium 3.3, chloride 94, bicarb 29, BUN 17, creatinine 1.04, calcium 8.9, magnesium 1.6, alkaline phosphatase 61, albumin 3.6, AST 20, ALT 9, total protein 7.1, troponin 16, lactic acid 2.0, white count 9.0, hemoglobin 12.6, hematocrit 37.4, MCV 92.6, RDW 12.7, platelet count 210 Urine drug screen is negative Respiratory viral panel is negative CT scan of the head without contrast shows chronic atrophic and ischemic changes.  No acute abnormality noted. Chest x-ray reviewed by me shows chronic changes with scarring. Twelve-lead EKG reviewed by me shows normal sinus rhythm with an incomplete right bundle branch block."   ROS   UTA 2/2 encephalopathy  Past History   Past Medical History:  Diagnosis Date  . Congestive heart failure ([place])   . COPD (chronic obstructive pulmonary disease) ([place])   . Degenerative joint disease   . Diabetes mellitus without complication ([place])   . Fibromyalgia   . GERD (gastroesophageal reflux disease)   . Hypertension   . Scoliosis   . Sleep apnea    Past Surgical  History:  Procedure Laterality Date  . ABDOMINAL HYSTERECTOMY    . APPENDECTOMY    . CESAREAN SECTION     x3  . CHOLECYSTECTOMY    . HIP SURGERY Left    joint replacement  . KYPHOPLASTY N/A 05/18/2019   Procedure: T6 KYPHOPLASTY;  Surgeon: Hessie Knows, MD;  Location: [facility] ORS;  Service: Orthopedics;  Laterality: N/A;   Family History  Problem Relation Age of Onset  . CVA Mother   . Lung cancer Mother   . Diabetes Mother   .  Heart attack Father   . Hypertension Father   . Lung cancer Father   . Asthma Father   . Breast cancer Neg Hx    Social History   Socioeconomic History  . Marital status: Single    Spouse name: Not on file  . Number of children: Not on file  . Years of education: Not on file  . Highest education level: Not on file  Occupational History  . Not on file  Tobacco Use  . Smoking status: Current Every Day Smoker    Packs/day: 0.50    Types: Cigarettes  . Smokeless tobacco: Never Used  Vaping Use  . Vaping Use: Never used  Substance and Sexual Activity  . Alcohol use: No  . Drug use: No  . Sexual activity: Not on file  Other Topics Concern  . Not on file  Social History Narrative  . Not on file   Social Determinants of Health   Financial Resource Strain: Not on file  Food Insecurity: Not on file  Transportation Needs: Not on file  Physical Activity: Not on file  Stress: Not on file  Social Connections: Not on file   Allergies  Allergen Reactions  . Tramadol Hives  . Tylenol [Acetaminophen] Nausea And Vomiting  . Vicodin [Hydrocodone-Acetaminophen] Hives    Medications   Medications Prior to Admission  Medication Sig Dispense Refill Last Dose  . alprazolam (XANAX) 2 MG tablet Take 2 mg by mouth 3 (three) times daily.   06/14/2020 at 2100  . amLODipine (NORVASC) 2.5 MG tablet Take 2.5 mg by mouth daily.   06/14/2020 at Unknown  . amphetamine-dextroamphetamine (ADDERALL) 20 MG tablet Take 20 mg by mouth daily at 12 noon.   06/14/2020 at 1200  . amphetamine-dextroamphetamine (ADDERALL) 30 MG tablet Take 30 mg by mouth 2 (two) times daily. (morning and evening)   06/14/2020 at 1800  . ARIPiprazole (ABILIFY) 15 MG tablet Take 7.5 mg by mouth daily.   06/14/2020 at Unknown  . aspirin EC 81 MG tablet Take 81 mg by mouth daily.   06/14/2020 at Unknown  . buprenorphine (SUBUTEX) 8 MG SUBL SL tablet Place 8 mg under the tongue 3 (three) times daily.   06/14/2020 at 2100  .  Cholecalciferol (VITAMIN D3) 50 MCG (2000 UT) TABS Take 2,000 mg by mouth daily.     . Cyanocobalamin 2500 MCG CHEW Chew 2,500 mcg by mouth daily.     . diclofenac (VOLTAREN) 50 MG EC tablet Take 50 mg by mouth 2 (two) times daily as needed for mild pain.   Unknown at PRN  . esomeprazole (NEXIUM) 40 MG capsule Take 40 mg by mouth daily.   06/14/2020 at Unknown  . fluticasone (FLONASE) 50 MCG/ACT nasal spray Place 1 spray into both nostrils at bedtime.   06/14/2020 at 2100  . Fluticasone-Salmeterol (ADVAIR) 250-50 MCG/DOSE AEPB Inhale 1 puff into the lungs 2 (two) times daily.   06/14/2020 at Unknown  .  furosemide (LASIX) 20 MG tablet Take 20 mg by mouth every morning.   06/14/2020 at 0800  . gabapentin (NEURONTIN) 800 MG tablet Take 800 mg by mouth 4 (four) times daily.   06/14/2020 at 2100  . isosorbide mononitrate (IMDUR) 30 MG 24 hr tablet Take 30 mg by mouth daily.   06/14/2020 at Unknown  . levofloxacin (LEVAQUIN) 750 MG tablet Take 750 mg by mouth daily.   06/14/2020 at Unknown  . lisinopril (ZESTRIL) 10 MG tablet Take 10 mg by mouth daily.   06/14/2020 at Unknown  . metFORMIN (GLUCOPHAGE) 500 MG tablet Take 500 mg by mouth 2 (two) times daily with a meal.    06/14/2020 at 1800  . metoprolol succinate (TOPROL-XL) 50 MG 24 hr tablet Take 50 mg by mouth daily.   06/14/2020 at Unknown  . NARCAN 4 MG/0.1ML LIQD nasal spray kit Place 1 spray into the nose as directed.   Unknown at PRN  . pantoprazole (PROTONIX) 20 MG tablet Take 20 mg by mouth daily.   06/14/2020 at Unknown  . rosuvastatin (CRESTOR) 20 MG tablet Take 20 mg by mouth daily.   06/14/2020 at Unknown  . tiZANidine (ZANAFLEX) 4 MG tablet Take 4 mg by mouth 3 (three) times daily as needed for muscle spasms.   Unknown at PRN  . Vitamin E 400 units TABS Take 400 Units by mouth daily.     Marland Kitchen zinc gluconate 50 MG tablet Take 50 mg by mouth daily.        Vitals   Vitals:   06/18/20 1711 06/18/20 2012 06/19/20 0021 06/19/20 0424  BP: (!) 142/80  126/73 126/69 (!) 111/96  Pulse: (!) 105 (!) 104 79 82  Resp: 16 18 16 12   Temp: 98.7 F (37.1 C) 98.4 F (36.9 C) (!) 97.5 F (36.4 C) 97.6 F (36.4 C)  TempSrc:  Oral Oral Oral  SpO2:  94% 98% 98%  Weight:      Height:         Body mass index is 24.6 kg/m.  Physical Exam   Physical Exam Gen: lethargic, will not open eyes, answer orientation questions, or follow commands Resp: CTAB, no w/r/r CV: RRR, no m/g/r; nml S1 and S2. 2+ symmetric peripheral pulses.  Neuro: *MS: lethargic, will not open eyes, answer orientation questions, or follow commands *Speech: no response *CN: PERRL, (+) corneals, oculocephalics, actively resists examiner opening eyes, sensation intact, face symmetric *Motor & sensory: withdraws to noxious stimuli x4 extrem * Reflexes: 1+ symmetric throughout except 0 bilat achilles, toes w/d only bilat   Labs   CBC:  Recent Labs  Lab 06/15/20 0749 06/16/20 0535 06/17/20 0423 06/18/20 0448  WBC 9.0   < > 13.7* 6.4  NEUTROABS 7.7  --   --   --   HGB 12.6   < > 13.3 12.8  HCT 37.4   < > 39.0 38.2  MCV 92.6   < > 92.2 94.8  PLT 210   < > 153 149*   < > = values in this interval not displayed.    Basic Metabolic Panel:  Lab Results  Component Value Date   NA 137 06/18/2020   K 4.5 06/18/2020   CO2 27 06/18/2020   GLUCOSE 94 06/18/2020   BUN 13 06/18/2020   CREATININE 0.56 06/18/2020   CALCIUM 8.9 06/18/2020   GFRNONAA >60 06/18/2020   GFRAA >60 07/27/2019   Lipid Panel: No results found for: LDLCALC HgbA1c:  Lab Results  Component Value  Date   HGBA1C 6.0 (H) 05/01/2020   Urine Drug Screen:     Component Value Date/Time   LABOPIA NONE DETECTED 06/15/2020 0914   COCAINSCRNUR NONE DETECTED 06/15/2020 0914   LABBENZ NONE DETECTED 06/15/2020 0914   AMPHETMU NONE DETECTED 06/15/2020 0914   THCU NONE DETECTED 06/15/2020 0914   LABBARB NONE DETECTED 06/15/2020 0914    Alcohol Level     Component Value Date/Time   ETH <10 04/30/2020  0605     Impression   65 yo woman admitted after possible sz-like episode. She certainly could have had a seizure based on description, and seems potentially post-ictal on exam. I'm going to restart her gabapentin, it could make her drowsy but she is on a high dose and withdrawal from that could make her seize. I am also going to start her on keppra given that she has had 2 admissions for this over the past 6 wks. She had EEG last admission, no indication for repeat, but I am going to order a brain MRI with and without contrast which she has not had. Monitor overnight for return to mental status baseline. I see she was on levaquin for community-acquired PNA. If she still needs abx, please use something other than a fluoroquinolone bc they can decrease seizure threshold.   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 If you have any further questions, please contact the neurology consultation attending.  Signed,  CSu Monks MD [facility] [phone] If 7pm- 7am, please page neurology on call as listed in [place]

## 2020-06-19 NOTE — Progress Notes (Signed)
[facility]  PROGRESS NOTE  Angela Daniel CVE:[id number] DOB: 16-Jul-1955 DOA: 06/15/2020 PCP: [facility], NP   Brief HPI:   65 year old female with history of hypertension, diabetes mellitus type 2, COPD, nicotine dependence, OSA, GERD, anxiety, depression was brought to the ER by EMS for evaluation of possible seizure episode and mental status change. Patient daughter noted that patient was sleeping in recliner living room and woke up in the morning at 6:30 AM and heard a loud noise in the living room and went to find the mother was having generalized shaking spell and foaming at the mouth. She states that patient had similar episode about 6 weeks ago and was hospitalized.  Her mother has a remote history of seizures and has been on Xanax for years. In the ED CT of the head showed chronic atrophic and ischemic changes, no acute normal to noted.    Subjective   Patient seen and examined, continues to be somnolent this morning.  She received Xanax 2 mg last night.   Assessment/Plan:     Altered mental status ? Postictal state from seizure versus polypharmacy.  There was questionable witnessed seizure at home as per daughter.  Patient has been started on IV Keppra per neurology.  Patient is also on multiple psychotropic medications including Xanax 2 mg 3 times daily, gabapentin 800 mg 4 times a day, Abilify, buprenorphine Adderall, after discussion with neurologist, gabapentin was discontinued and Xanax is being weaned off.  Adderall has also been discontinued.  P.o. meds have been on hold due to extreme somnolence.  Xanax has been changed to 0.5 mg p.o. every 6 hours per neurology to prevent benzodiazepine withdrawal seizures.   Seizure  ?  Benzodiazepine withdrawal.  Patient is on Xanax for anxiety disorder for long time.  Urine drug screen was negative for benzodiazepine.  Patient has been started on Keppra by neurology.  No definite epileptiform discharges seen on  EEG.  Xanax is being weaned off.  Started on Xanax as above   Diabetes mellitus type 2 Metformin on hold,  Continue sliding scale insulin NovoLog. CBG well controlled We will start her on regular diet.   Lactic acidosis Lactic acid was elevated 2.0, improved to 1.3.  Total CK is 775. Likely from seizure.  Hypomagnesemia Magnesium was low at 1.5 yesterday.  -She received additional magnesium sulfate 2 g IV x1. -Check serum magnesium level today  Hypertension Blood pressure is stable, as patient was not taking p.o. meds, she was started on metoprolol 2.5 mg IV every 8 hours scheduled.  Continue to monitor.    Opiate dependence continue buprenorphine 8 mg 3 times daily  Transaminitis - AST 244; ALT 82.  Unclear etiology -Previous LFTs on 526 were normal -We will obtain abdominal ultrasound    Scheduled medications:   . ALPRAZolam  0.5 mg Oral Q6H  . aspirin EC  81 mg Oral Daily  . buprenorphine  8 mg Sublingual TID  . Chlorhexidine Gluconate Cloth  6 each Topical Daily  . cholecalciferol  2,000 Units Oral Daily  . enoxaparin (LOVENOX) injection  40 mg Subcutaneous Q24H  . fluticasone  1 spray Each Nare QHS  . insulin aspart  0-9 Units Subcutaneous TID WC  . isosorbide mononitrate  30 mg Oral Daily  . metoprolol tartrate  2.5 mg Intravenous Q8H  . mometasone-formoterol  2 puff Inhalation BID  . nicotine  21 mg Transdermal Daily  . pantoprazole  40 mg Oral Daily  . vitamin B-12  2,500 mcg Oral Daily  . vitamin E  400 Units Oral Daily         Data Reviewed:   CBG:  Recent Labs  Lab 06/18/20 1742 06/18/20 2017 06/19/20 0018 06/19/20 0436 06/19/20 0756  GLUCAP 113* 140* 130* 116* 91    SpO2: 100 % O2 Flow Rate (L/min): 2 L/min    Vitals:   06/18/20 2012 06/19/20 0021 06/19/20 0424 06/19/20 0754  BP: 126/73 126/69 (!) 111/96 131/72  Pulse: (!) 104 79 82 61  Resp: 18 16 12 16   Temp: 98.4 F (36.9 C) (!) 97.5 F (36.4 C) 97.6 F (36.4 C) 97.7 F  (36.5 C)  TempSrc: Oral Oral Oral   SpO2: 94% 98% 98% 100%  Weight:      Height:         Intake/Output Summary (Last 24 hours) at 06/19/2020 1141 Last data filed at 06/19/2020 1003 Gross per 24 hour  Intake 1506.25 ml  Output 550 ml  Net 956.25 ml    05/28 1901 - 05/30 0700 In: 6173.3 [I.V.:5523.3] Out: 2750 [Urine:2750]  Filed Weights   06/15/20 0739  Weight: 63 kg    CBC:  Recent Labs  Lab 06/15/20 0749 06/16/20 0535 06/17/20 0423 06/18/20 0448  WBC 9.0 6.1 13.7* 6.4  HGB 12.6 12.3 13.3 12.8  HCT 37.4 36.6 39.0 38.2  PLT 210 191 153 149*  MCV 92.6 94.6 92.2 94.8  MCH 31.2 31.8 31.4 31.8  MCHC 33.7 33.6 34.1 33.5  RDW 12.7 13.0 12.4 12.0  LYMPHSABS 0.7  --   --   --   MONOABS 0.5  --   --   --   EOSABS 0.1  --   --   --   BASOSABS 0.0  --   --   --     Complete metabolic panel:  Recent Labs  Lab 06/15/20 0749 06/15/20 1032 06/16/20 0535 06/17/20 0423 06/18/20 0448  NA 137  --  141 140 137  K 3.3*  --  3.7 4.9 4.5  CL 94*  --  104 103 101  CO2 29  --  29 27 27   GLUCOSE 130*  --  104* 87 94  BUN 17  --  15 14 13   CREATININE 1.04*  --  0.66 0.66 0.56  CALCIUM 8.9  --  8.4* 9.0 8.9  AST 20  --   --   --  244*  ALT 9  --   --   --  82*  ALKPHOS 61  --   --   --  99  BILITOT 0.4  --   --   --  0.9  ALBUMIN 3.6  --   --   --  3.3*  MG 1.6*  --   --   --  1.5*  LATICACIDVEN 2.0* 1.3  --   --   --     No results for input(s): LIPASE, AMYLASE in the last 168 hours.  Recent Labs  Lab 06/15/20 0914  SARSCOV2NAA NEGATIVE    ------------------------------------------------------------------------------------------------------------------ No results for input(s): CHOL, HDL, LDLCALC, TRIG, CHOLHDL, LDLDIRECT in the last 72 hours.  Lab Results  Component Value Date   HGBA1C 6.0 (H) 05/01/2020   ------------------------------------------------------------------------------------------------------------------ No results for input(s): TSH, T4TOTAL,  T3FREE, THYROIDAB in the last 72 hours.  Invalid input(s): FREET3 ------------------------------------------------------------------------------------------------------------------ No results for input(s): VITAMINB12, FOLATE, FERRITIN, TIBC, IRON, RETICCTPCT in the last 72 hours.  Coagulation profile No results for input(s): INR, PROTIME in the last 168  hours. No results for input(s): DDIMER in the last 72 hours.  Cardiac Enzymes Recent Labs  Lab 06/15/20 1032  CKTOTAL 775*    ------------------------------------------------------------------------------------------------------------------    Component Value Date/Time   BNP 156.9 (H) 04/30/2020 0605     Antibiotics: Anti-infectives (From admission, onward)   None       Radiology Reports  EEG adult  Result Date: 06/17/2020 Jefferson Fuel, MD     06/17/2020 10:44 PM Routine EEG Report Angela Daniel is a 65 y.o. female with a history of encephalopathy and withdrawal seizures who is undergoing an EEG to evaluate for seizures. Report: This EEG was acquired with electrodes placed according to the International 10-20 electrode system (including Fp1, Fp2, F3, F4, C3, C4, P3, P4, O1, O2, T3, T4, T5, T6, A1, A2, Fz, Cz, Pz). The following electrodes were missing or displaced: none. The background was primarily composed of activity in the 6-8 Hz range. There was no clear awake recording and no clear posterior dominant rhythm.This activity is reactive to stimulation. Drowsiness was manifested by background fragmentation; sleep spindles signified deeper sleep. There was no focal slowing. There were no interictal epileptiform discharges. There were no electrographic seizures identified. There was no abnormal response to photic stimulation. Hyperventilation was not performed. Impression and clinical correlation: This EEG was obtained while somnolent and was abnoraml due to mild diffuse slowing signaling global cerebral dysfunction. Bing Neighbors, MD [facility] [phone] If 7pm- 7am, please page neurology on call as listed in [facility].      DVT prophylaxis: Lovenox  Code Status: Full code  Family Communication: Discussed with daughter at bedside   Consultants:  Neurology  Procedures:      Objective    Physical Examination:   General-appears lethargic  Heart-S1-S2, regular, no murmur auscultated  Lungs-clear to auscultation bilaterally, no wheezing or crackles auscultated  Abdomen-soft, nontender, no organomegaly  Extremities-no edema in the lower extremities  Neuro-somnolent but opens eyes to verbal stimuli, no other focal deficit noted   Status is: Inpatient  Dispo: The patient is from: Home              Anticipated d/c is to: Home              Anticipated d/c date is: 06/23/2020              Patient currently not stable for discharge  Barrier to discharge-altered mental status, somnolence  COVID-19 Labs  No results for input(s): DDIMER, FERRITIN, LDH, CRP in the last 72 hours.  Lab Results  Component Value Date   SARSCOV2NAA NEGATIVE 06/15/2020   SARSCOV2NAA NEGATIVE 04/30/2020   SARSCOV2NAA NEGATIVE 05/14/2019    Microbiology  Recent Results (from the past 240 hour(s))  Resp Panel by RT-PCR (Flu A&B, Covid)     Status: None   Collection Time: 06/15/20  9:14 AM   Specimen: Nasopharyngeal(NP) swabs in vial transport medium  Result Value Ref Range Status   SARS Coronavirus 2 by RT PCR NEGATIVE NEGATIVE Final    Comment: (NOTE) SARS-CoV-2 target nucleic acids are NOT DETECTED.  The SARS-CoV-2 RNA is generally detectable in upper respiratory specimens during the acute phase of infection. The lowest concentration of SARS-CoV-2 viral copies this assay can detect is 138 copies/mL. A negative result does not preclude SARS-Cov-2 infection and should not be used as the sole basis for treatment or other patient management decisions. A negative result may occur with   improper specimen collection/handling, submission of specimen other than nasopharyngeal  swab, presence of viral mutation(s) within the areas targeted by this assay, and inadequate number of viral copies(<138 copies/mL). A negative result must be combined with clinical observations, patient history, and epidemiological information. The expected result is Negative.  Fact Sheet for Patients:  BloggerCourse.com  Fact Sheet for Healthcare Providers:  SeriousBroker.it  This test is no t yet approved or cleared by the [place] FDA and  has been authorized for detection and/or diagnosis of SARS-CoV-2 by FDA under an Emergency Use Authorization (EUA). This EUA will remain  in effect (meaning this test can be used) for the duration of the COVID-19 declaration under Section 564(b)(1) of the Act, 21 U.S.C.section 360bbb-3(b)(1), unless the authorization is terminated  or revoked sooner.       Influenza A by PCR NEGATIVE NEGATIVE Final   Influenza B by PCR NEGATIVE NEGATIVE Final    Comment: (NOTE) The Xpert Xpress SARS-CoV-2/FLU/RSV plus assay is intended as an aid in the diagnosis of influenza from Nasopharyngeal swab specimens and should not be used as a sole basis for treatment. Nasal washings and aspirates are unacceptable for Xpert Xpress SARS-CoV-2/FLU/RSV testing.  Fact Sheet for Patients: BloggerCourse.com  Fact Sheet for Healthcare Providers: SeriousBroker.it  This test is not yet approved or cleared by the [place] FDA and has been authorized for detection and/or diagnosis of SARS-CoV-2 by FDA under an Emergency Use Authorization (EUA). This EUA will remain in effect (meaning this test can be used) for the duration of the COVID-19 declaration under Section 564(b)(1) of the Act, 21 U.S.C. section 360bbb-3(b)(1), unless the authorization is terminated  or revoked.  Performed at [facility], [address], [place], [place] [address]         Meredeth Ide   [facility] If 7PM-7AM, please contact night-coverage at [email or web address], Office  [phone]   06/19/2020, 11:41 AM  LOS: 2 days

## 2020-06-20 DIAGNOSIS — I10 Essential (primary) hypertension: Secondary | ICD-10-CM

## 2020-06-20 DIAGNOSIS — R4182 Altered mental status, unspecified: Secondary | ICD-10-CM | POA: Diagnosis not present

## 2020-06-20 LAB — HEPATIC FUNCTION PANEL
ALT: 53 U/L — ABNORMAL HIGH (ref 0–44)
AST: 85 U/L — ABNORMAL HIGH (ref 15–41)
Albumin: 3.1 g/dL — ABNORMAL LOW (ref 3.5–5.0)
Alkaline Phosphatase: 127 U/L — ABNORMAL HIGH (ref 38–126)
Bilirubin, Direct: 0.2 mg/dL (ref 0.0–0.2)
Indirect Bilirubin: 0.5 mg/dL (ref 0.3–0.9)
Total Bilirubin: 0.7 mg/dL (ref 0.3–1.2)
Total Protein: 6.4 g/dL — ABNORMAL LOW (ref 6.5–8.1)

## 2020-06-20 LAB — GLUCOSE, CAPILLARY
Glucose-Capillary: 102 mg/dL — ABNORMAL HIGH (ref 70–99)
Glucose-Capillary: 104 mg/dL — ABNORMAL HIGH (ref 70–99)
Glucose-Capillary: 77 mg/dL (ref 70–99)
Glucose-Capillary: 86 mg/dL (ref 70–99)
Glucose-Capillary: 91 mg/dL (ref 70–99)
Glucose-Capillary: 97 mg/dL (ref 70–99)

## 2020-06-20 LAB — HEMOGLOBIN A1C
Hgb A1c MFr Bld: 6 % — ABNORMAL HIGH (ref 4.8–5.6)
Mean Plasma Glucose: 126 mg/dL

## 2020-06-20 MED ORDER — SENNOSIDES-DOCUSATE SODIUM 8.6-50 MG PO TABS
1.0000 | ORAL_TABLET | Freq: Two times a day (BID) | ORAL | Status: DC
  Administered 2020-06-20 – 2020-06-23 (×7): 1 via ORAL
  Filled 2020-06-20 (×7): qty 1

## 2020-06-20 MED ORDER — POTASSIUM CHLORIDE CRYS ER 20 MEQ PO TBCR
40.0000 meq | EXTENDED_RELEASE_TABLET | Freq: Once | ORAL | Status: AC
  Administered 2020-06-20: 40 meq via ORAL
  Filled 2020-06-20: qty 2

## 2020-06-20 MED ORDER — POLYETHYLENE GLYCOL 3350 17 G PO PACK
17.0000 g | PACK | Freq: Every day | ORAL | Status: DC
  Administered 2020-06-20 – 2020-06-23 (×4): 17 g via ORAL
  Filled 2020-06-20 (×4): qty 1

## 2020-06-20 NOTE — Progress Notes (Signed)
Neurology Progress Note   S:// Seen and examined. No acute changes overnight. More awake and sitting in chair today Reports that she might have missed taking her meds for few days.  O:// Current vital signs: BP (!) 154/81 (BP Location: Right Arm)   Pulse 81   Temp 97.8 F (36.6 C) (Oral)   Resp 17   Ht 5\' 3"  (1.6 m)   Wt 63 kg   SpO2 100%   BMI 24.60 kg/m  Vital signs in last 24 hours: Temp:  [97.5 F (36.4 C)-98.6 F (37 C)] 97.8 F (36.6 C) (05/31 0759) Pulse Rate:  [68-87] 81 (05/31 0759) Resp:  [15-18] 17 (05/31 0759) BP: (127-154)/(65-83) 154/81 (05/31 0759) SpO2:  [90 %-100 %] 100 % (05/31 0759) General: sitting in chair, NAD HEENT: Normocephalic/atraumatic Respiratory: Clear Cardiovascular: Regular rate rhythm Abdomen soft nondistended nontender Extremities cool and mildly discolored fingers b/l Neurological exam AAOx3 Mildly dysarthric No aphasia CN 2-12 intact Motor: 5/5 in all 4s Sensation intact to LT Coord: no dysmetria although slow to perform. Gait deferred  Medications  Current Facility-Administered Medications:  .  0.9 %  sodium chloride infusion, , Intravenous, Continuous, 02-03-2003, MD, Last Rate: 75 mL/hr at 06/20/20 0515, New Bag at 06/20/20 0515 .  acetaminophen (TYLENOL) tablet 650 mg, 650 mg, Oral, Q6H PRN, 06/22/20, MD, 650 mg at 06/18/20 2328 .  ALPRAZolam (XANAX) tablet 0.5 mg, 0.5 mg, Oral, Q6H, Lama, Gagan S, MD .  aspirin EC tablet 81 mg, 81 mg, Oral, Daily, 2329, Gagan S, MD, 81 mg at 06/20/20 0913 .  buprenorphine (SUBUTEX) SL tablet 8 mg, 8 mg, Sublingual, TID, 06/22/20, Sharl Ma, MD, 8 mg at 06/20/20 0914 .  Chlorhexidine Gluconate Cloth 2 % PADS 6 each, 6 each, Topical, Daily, 06/22/20, MD, 6 each at 06/19/20 1000 .  cholecalciferol (VITAMIN D3) tablet 2,000 Units, 2,000 Units, Oral, Daily, 06/21/20, MD, 2,000 Units at 06/20/20 0915 .  enoxaparin (LOVENOX) injection 40 mg, 40 mg, Subcutaneous, Q24H, 06/22/20, Sharl Ma,  MD, 40 mg at 06/19/20 2045 .  fluticasone (FLONASE) 50 MCG/ACT nasal spray 1 spray, 1 spray, Each Nare, QHS, 2046, MD, 1 spray at 06/17/20 2104 .  guaiFENesin-dextromethorphan (ROBITUSSIN DM) 100-10 MG/5ML syrup 5 mL, 5 mL, Oral, Q4H PRN, 2105, Sharl Ma, MD, 5 mL at 06/20/20 0001 .  insulin aspart (novoLOG) injection 0-9 Units, 0-9 Units, Subcutaneous, TID WC, Lama, 06/22/20, MD .  isosorbide mononitrate (IMDUR) 24 hr tablet 30 mg, 30 mg, Oral, Daily, Sarina Ill, Sharl Ma, MD, 30 mg at 06/20/20 0915 .  [COMPLETED] levETIRAcetam (KEPPRA) 1,260 mg in sodium chloride 0.9 % 100 mL IVPB, 20 mg/kg, Intravenous, Once, Stopped at 06/15/20 1447 **FOLLOWED BY** levETIRAcetam (KEPPRA) IVPB 500 mg/100 mL premix, 500 mg, Intravenous, Q12H, 06/17/20, MD, Last Rate: 400 mL/hr at 06/20/20 0135, 500 mg at 06/20/20 0135 .  LORazepam (ATIVAN) injection 2 mg, 2 mg, Intravenous, Q4H PRN, 06/22/20, Sharl Ma, MD .  metoprolol tartrate (LOPRESSOR) injection 2.5 mg, 2.5 mg, Intravenous, Q8H, Sarina Ill, Sharl Ma, MD, 2.5 mg at 06/20/20 0516 .  mometasone-formoterol (DULERA) 200-5 MCG/ACT inhaler 2 puff, 2 puff, Inhalation, BID, 06/22/20, Sharl Ma, MD, 2 puff at 06/20/20 [phone] .  nicotine (NICODERM CQ - dosed in mg/24 hours) patch 21 mg, 21 mg, Transdermal, Daily, 2353, Sharl Ma, MD, 21 mg at 06/20/20 0919 .  ondansetron (ZOFRAN) tablet 4 mg, 4 mg, Oral, Q6H PRN **OR** ondansetron (ZOFRAN) injection 4  mg, 4 mg, Intravenous, Q6H PRN, Sharl Ma, Sarina Ill, MD .  pantoprazole (PROTONIX) EC tablet 40 mg, 40 mg, Oral, Daily, Sharl Ma, Sarina Ill, MD, 40 mg at 06/20/20 0913 .  polyethylene glycol (MIRALAX / GLYCOLAX) packet 17 g, 17 g, Oral, Daily, Girguis, David, MD .  senna-docusate (Senokot-S) tablet 1 tablet, 1 tablet, Oral, BID, Girguis, David, MD .  vitamin B-12 (CYANOCOBALAMIN) tablet 2,500 mcg, 2,500 mcg, Oral, Daily, Sharl Ma, Sarina Ill, MD, 2,500 mcg at 06/20/20 0915 .  vitamin E capsule 400 Units, 400 Units, Oral, Daily, Meredeth Ide, MD, 400 Units at  06/20/20 0912  Labs CBC    Component Value Date/Time   WBC 6.4 06/18/2020 0448   RBC 4.03 06/18/2020 0448   HGB 12.8 06/18/2020 0448   HGB 12.9 06/28/2012 2218   HCT 38.2 06/18/2020 0448   HCT 37.3 06/28/2012 2218   PLT 149 (L) 06/18/2020 0448   PLT 218 06/28/2012 2218   MCV 94.8 06/18/2020 0448   MCV 91 06/28/2012 2218   MCH 31.8 06/18/2020 0448   MCHC 33.5 06/18/2020 0448   RDW 12.0 06/18/2020 0448   RDW 13.2 06/28/2012 2218   LYMPHSABS 0.7 06/15/2020 0749   MONOABS 0.5 06/15/2020 0749   EOSABS 0.1 06/15/2020 0749   BASOSABS 0.0 06/15/2020 0749    CMP     Component Value Date/Time   NA 137 06/18/2020 0448   NA 139 06/28/2012 2218   K 4.5 06/18/2020 0448   K 3.7 06/28/2012 2218   CL 101 06/18/2020 0448   CL 108 (H) 06/28/2012 2218   CO2 27 06/18/2020 0448   CO2 25 06/28/2012 2218   GLUCOSE 94 06/18/2020 0448   GLUCOSE 118 (H) 06/28/2012 2218   BUN 13 06/18/2020 0448   BUN 9 06/28/2012 2218   CREATININE 0.56 06/18/2020 0448   CREATININE 0.73 06/28/2012 2218   CALCIUM 8.9 06/18/2020 0448   CALCIUM 8.8 06/28/2012 2218   PROT 6.4 (L) 06/20/2020 0402   ALBUMIN 3.1 (L) 06/20/2020 0402   AST 85 (H) 06/20/2020 0402   ALT 53 (H) 06/20/2020 0402   ALKPHOS 127 (H) 06/20/2020 0402   BILITOT 0.7 06/20/2020 0402   GFRNONAA >60 06/18/2020 0448   GFRNONAA >60 06/28/2012 2218   GFRAA >60 07/27/2019 2323   GFRAA >60 06/28/2012 2218   EEG:    Imaging I have reviewed images in epic and the results pertinent to this consultation are: CT head with no acute changes MRI brain with and without contrast-no acute intracranial abnormality.  Moderate T2/flair hyperintensity in the cerebral white matter-nonspecific but most commonly related to chronic microvascular ischemic disease.  Assessment:  65 year old woman with a history of benzodiazepine withdrawal seizures admitted with encephalopathy.  Urinary toxicology screen negative for benzodiazepine. Multifactorial toxic metabolic  encephalopathy likely secondary to benzodiazepine withdrawal-question If there was a seizure preceding as on presentation she had some foaming at the mouth.  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  Continue Keppra 500 BID  Also agree with discontinuing stimulants.  As previously recommended by Dr. Selina Cooley, I would taper his Xanax slowly to avoid withdrawal symptoms including seizures-discharge on 0.5mg  q6h or 1mg  BID with slow and gradual taper as outpatient.  Pain management per primary team  Plan discussed with Dr. over secure chat  -- Frederick Peers, MD Neurologist [facility] Pager: [phone]

## 2020-06-20 NOTE — Progress Notes (Signed)
Patient lethargic and drowsy at times. Wakes up at times asking for daughter. VSS, will continue to monitor.

## 2020-06-20 NOTE — Evaluation (Signed)
Physical Therapy Evaluation Patient Details Name: Angela Daniel MRN: [id number] DOB: [birth year] Today's Date: 06/20/2020   History of Present Illness  65 year old female with history of hypertension, diabetes mellitus type 2, COPD, nicotine dependence, OSA, GERD, anxiety, depression was brought to the ER by EMS for evaluation of possible seizure episode and mental status change.  Clinical Impression  Pt up in recliner but clearly sleepy.  She did wake easily and was eager to work with PT though she did have trouble keeping her eyes open from time to time.  She was able to ambulate ~60 ft in the room but was highly reliant on the walker (reports baseline she is out of the house daily and has never needed an AD).  Pt acknowledges that she is far from her baseline and agrees with PT that she will need HHPT and a walker for home.     Follow Up Recommendations Home health PT;Supervision - Intermittent    Equipment Recommendations  Rolling walker with 5" wheels    Recommendations for Other Services       Precautions / Restrictions Precautions Precautions: Fall Restrictions Weight Bearing Restrictions: No      Mobility  Bed Mobility               General bed mobility comments: in recliner on arrival, not tested    Transfers Overall transfer level: Needs assistance Equipment used: Rolling walker (2 wheeled) Transfers: Sit to/from Stand Sit to Stand: Min guard         General transfer comment: minimal cuing for set up and hand placement, able to rise w/o assist but needed UEs to stabilize on walker  Ambulation/Gait Ambulation/Gait assistance: Min assist Gait Distance (Feet): 60 Feet Assistive device: Rolling walker (2 wheeled)       General Gait Details: Pt with slow and guarded gait.  Reports she normally does not need AD, is much faster and more confident.  She was clearly reliant on the walker, and indicates that she is not at all near her baseline.  Unable to get  consistent vitals t/o the effort but on 3L O2 she did not have objective or subjective fatigue.  Stairs            Wheelchair Mobility    Modified Rankin (Stroke Patients Only)       Balance Overall balance assessment: Needs assistance Sitting-balance support: No upper extremity supported Sitting balance-Leahy Scale: Good     Standing balance support: Bilateral upper extremity supported Standing balance-Leahy Scale: Fair Standing balance comment: highly reliant on walker during ambulation                             Pertinent Vitals/Pain Pain Assessment: No/denies pain    Home Living Family/patient expects to be discharged to:: Private residence Living Arrangements: Children (daughter and grandson) Available Help at Discharge: Available 24 hours/day   Home Access: Stairs to enter Entrance Stairs-Rails: Can reach both Entrance Stairs-Number of Steps: 5 Home Layout: One level Home Equipment: None      Prior Function Level of Independence: Independent         Comments: Pt reports that she is out of the home almost daily, runs errands, stays active     Hand Dominance        Extremity/Trunk Assessment   Upper Extremity Assessment Upper Extremity Assessment: Overall WFL for tasks assessed    Lower Extremity Assessment Lower Extremity Assessment: Overall WFL for  tasks assessed       Communication   Communication: No difficulties  Cognition Arousal/Alertness: Lethargic Behavior During Therapy: WFL for tasks assessed/performed Overall Cognitive Status: Difficult to assess                                 General Comments: Pt able to answer orientation questions relatively well, acknowledges that she is not at her baseline - had difficulty keeping her eyes open consistently t/o session      General Comments      Exercises     Assessment/Plan    PT Assessment Patient needs continued PT services  PT Problem List  Decreased strength;Decreased range of motion;Decreased activity tolerance;Decreased balance;Decreased mobility;Decreased knowledge of use of DME;Decreased safety awareness;Cardiopulmonary status limiting activity;Decreased cognition       PT Treatment Interventions DME instruction;Gait training;Functional mobility training;Stair training;Therapeutic exercise;Therapeutic activities;Balance training;Neuromuscular re-education;Cognitive remediation;Patient/family education    PT Goals (Current goals can be found in the Care Plan section)  Acute Rehab PT Goals Patient Stated Goal: go home PT Goal Formulation: With patient Time For Goal Achievement: 07/04/20 Potential to Achieve Goals: Good    Frequency Min 2X/week   Barriers to discharge        Co-evaluation               AM-PAC PT "6 Clicks" Mobility  Outcome Measure Help needed turning from your back to your side while in a flat bed without using bedrails?: A Little Help needed moving from lying on your back to sitting on the side of a flat bed without using bedrails?: A Little Help needed moving to and from a bed to a chair (including a wheelchair)?: A Little Help needed standing up from a chair using your arms (e.g., wheelchair or bedside chair)?: A Little Help needed to walk in hospital room?: A Little Help needed climbing 3-5 steps with a railing? : A Lot 6 Click Score: 17    End of Session Equipment Utilized During Treatment: Gait belt;Oxygen Activity Tolerance: Patient limited by fatigue Patient left: with chair alarm set;with call bell/phone within reach Nurse Communication: Mobility status PT Visit Diagnosis: Muscle weakness (generalized) (M62.81);Difficulty in walking, not elsewhere classified (R26.2)    Time: 6712-4580 PT Time Calculation (min) (ACUTE ONLY): 24 min   Charges:   PT Evaluation $PT Eval Low Complexity: 1 Low PT Treatments $Gait Training: 8-22 mins        Malachi Pro, DPT 06/20/2020,  1:46 PM

## 2020-06-20 NOTE — Evaluation (Signed)
Occupational Therapy Evaluation Patient Details Name: Angela Daniel MRN: [id number] DOB: [birth year] Today's Date: 06/20/2020    History of Present Illness 65 year old female with history of hypertension, diabetes mellitus type 2, COPD, nicotine dependence, OSA, GERD, anxiety, depression was brought to the ER by EMS for evaluation of possible seizure episode and mental status change.   Clinical Impression   Patient presenting with decreased I in self care, balance, functional mobility/transfers, endurance, and safety awareness. Patient reports being independent at home and living with daughter and 34 y/o grandson PTA. This session pt is very lethargic and found sleeping over top of lunch tray with fork in hand. OT aroused pt for therapeutic intervention and pt fixing salad and taking several bites while continuing to fall asleep. Pt requesting to return to bed secondary to lethargy and demonstrated stand pivot transfer with min HHA. Sit >supine with supervision for technique. Patient will benefit from acute OT to increase overall independence in the areas of ADLs, functional mobility, and safety awareness in order to safely discharge home with family.    Follow Up Recommendations  Home health OT    Equipment Recommendations  Other (comment);3 in 1 bedside commode (RW)       Precautions / Restrictions Precautions Precautions: Fall Restrictions Weight Bearing Restrictions: No      Mobility Bed Mobility Overal bed mobility: Needs Assistance Bed Mobility: Sit to Supine       Sit to supine: Supervision   General bed mobility comments: for safety    Transfers Overall transfer level: Needs assistance Equipment used: 1 person hand held assist Transfers: Sit to/from Stand;Stand Pivot Transfers Sit to Stand: Min guard Stand pivot transfers: Min guard       General transfer comment: minimal cuing for set up and hand placement, able to rise w/o assist but needed UEs to stabilize on  walker    Balance Overall balance assessment: Needs assistance Sitting-balance support: No upper extremity supported Sitting balance-Leahy Scale: Good     Standing balance support: Bilateral upper extremity supported Standing balance-Leahy Scale: Fair Standing balance comment: highly reliant on walker during ambulation                           ADL either performed or assessed with clinical judgement   ADL                                         General ADL Comments: min guard - min A overall for balance. Pt fatigues quickly.     Vision Patient Visual Report: No change from baseline              Pertinent Vitals/Pain Pain Assessment: No/denies pain     Hand Dominance Right   Extremity/Trunk Assessment Upper Extremity Assessment Upper Extremity Assessment: Overall WFL for tasks assessed   Lower Extremity Assessment Lower Extremity Assessment: Overall WFL for tasks assessed       Communication Communication Communication: No difficulties   Cognition Arousal/Alertness: Lethargic Behavior During Therapy: WFL for tasks assessed/performed Overall Cognitive Status: Difficult to assess                                 General Comments: Pt oriented to self, location , and time. She needed lots of cuing to remain alert.  Home Living Family/patient expects to be discharged to:: Private residence Living Arrangements: Children Available Help at Discharge: Available 24 hours/day   Home Access: Stairs to enter [employer] of Steps: 5 Entrance Stairs-Rails: Can reach both Home Layout: One level     Bathroom Shower/Tub: Tub/shower unit         Home Equipment: None          Prior Functioning/Environment Level of Independence: Independent        Comments: Pt reports that she is out of the home almost daily, runs errands, stays active        OT Problem List: Decreased strength;Impaired  balance (sitting and/or standing);Decreased activity tolerance;Decreased safety awareness;Decreased knowledge of precautions      OT Treatment/Interventions: Self-care/ADL training;Manual therapy;Therapeutic exercise;Patient/family education;Balance training;Energy conservation;Therapeutic activities;DME and/or AE instruction    OT Goals(Current goals can be found in the care plan section) Acute Rehab OT Goals Patient Stated Goal: go home OT Goal Formulation: With patient Time For Goal Achievement: 07/04/20 Potential to Achieve Goals: Good ADL Goals Pt Will Perform Grooming: with modified independence;standing Pt Will Perform Lower Body Dressing: with modified independence;sit to/from stand Pt Will Transfer to Toilet: with modified independence;ambulating Pt Will Perform Toileting - Clothing Manipulation and hygiene: with modified independence;sit to/from stand  OT Frequency: Min 2X/week   Barriers to D/C:    none known at this time          AM-PAC OT "6 Clicks" Daily Activity     Outcome Measure Help from another person eating meals?: None Help from another person taking care of personal grooming?: A Little Help from another person toileting, which includes using toliet, bedpan, or urinal?: A Little Help from another person bathing (including washing, rinsing, drying)?: A Little Help from another person to put on and taking off regular upper body clothing?: None Help from another person to put on and taking off regular lower body clothing?: A Little 6 Click Score: 20   End of Session Equipment Utilized During Treatment: Oxygen (2Ls) Nurse Communication: Mobility status;Precautions  Activity Tolerance: Patient tolerated treatment well Patient left: in bed;with call bell/phone within reach;with bed alarm set  OT Visit Diagnosis: Unsteadiness on feet (R26.81);Muscle weakness (generalized) (M62.81)                Time: 1345-1405 OT Time Calculation (min): 20 min Charges:  OT  General Charges $OT Visit: 1 Visit OT Evaluation $OT Eval Low Complexity: 1 Low OT Treatments $Self Care/Home Management : 8-22 mins  Jackquline Denmark, MS, OTR/L , CBIS ascom [phone]  06/20/20, 2:33 PM

## 2020-06-20 NOTE — Progress Notes (Signed)
Progress Note    Angela LovelessSara Jean Norment   ZOX:[id number]  DOB: 05/16/1955  DOA: 06/15/2020     3  PCP: [facility], NP  CC: AMS  Hospital Course: 65 year old female with history of hypertension, diabetes mellitus type 2, COPD, nicotine dependence, OSA, GERD, anxiety, depression was brought to the ER by EMS for evaluation of possible seizure episode and mental status change.  Patient daughter noted that patient was sleeping in recliner living room and woke up in the morning at 6:30 AM and heard a loud noise in the living room and went to find the mother was having generalized shaking spell and foaming at the mouth. She states that patient had similar episode about 6 weeks ago and was hospitalized.  Her mother has a remote history of seizures and has been on Xanax for years. In the ED CT of the head showed chronic atrophic and ischemic changes, no acute normal to noted. Furthermore, UDS on admission was negative and was also negative on 04/30/2020.  The patient stated she may have forgotten to take medications at times when she became more lucid.   She has been followed by neurology during hospitalization as well.  Xanax has been started on a decreased dose with plans to taper outpatient.  Interval History:  No events overnight.  Patient more awake and alert when seen this morning compared to prior days per her family who is bedside.  ROS: Constitutional: negative for chills and fevers, Respiratory: negative for cough, Cardiovascular: negative for chest pain and Gastrointestinal: negative for abdominal pain  Assessment & Plan:  Acute toxic metabolic encephalopathy ? Postictal state from seizure versus polypharmacy.  There was questionable witnessed seizure at home as per daughter.  Patient has been started on IV Keppra per neurology.  Patient is also on multiple psychotropic medications including Xanax 2 mg 3 times daily, gabapentin 800 mg 4 times a day, Abilify, buprenorphine  Adderall -Gabapentin and Adderall have been discontinued -Xanax now 0.5 mg every 6 hours scheduled and plan will be for outpatient further tapering to off.  This has been instructed to the patient -She is alert and oriented x3 as of 06/20/2020  Seizure  ?  Benzodiazepine withdrawal.  - see encephalopathy - continue keppra - neurology has also followed  Diabetes mellitus type 2 Metformin on hold -A1c 6% on 06/16/2020 Continue sliding scale insulin NovoLog.  Lactic acidosis -resolved Lactic acid was elevated 2.0, improved to 1.3.  Total CK is 775. Likely from seizure.  Hypomagnesemia -Replete and recheck as needed  Hypertension Blood pressure is stable, as patient was not taking p.o. meds, she was started on metoprolol 2.5 mg IV every 8 hours scheduled.  Continue to monitor.   Opiate dependence continue buprenorphine 8 mg 3 times daily  Transaminitis -Probable transient elevation from acute illness; already improving - Ultrasound unremarkable  Old records reviewed in assessment of this patient  Antimicrobials:   DVT prophylaxis: enoxaparin (LOVENOX) injection 40 mg Start: 06/15/20 2200   Code Status:   Code Status: Full Code Family Communication: Daughter  Disposition Plan: Status is: Inpatient  Remains inpatient appropriate because:Unsafe d/c plan and Inpatient level of care appropriate due to severity of illness   Dispo: The patient is from: Home              Anticipated d/c is to: pending PT/OT              Patient currently is not medically stable to d/c.   Difficult  to place patient No  Risk of unplanned readmission score: Unplanned Admission- Pilot do not use: 15.17   Objective: Blood pressure (!) 159/83, pulse 100, temperature 97.6 F (36.4 C), resp. rate 17, height 5\' 3"  (1.6 m), weight 63 kg, SpO2 94 %.  Examination: General appearance: alert, cooperative, no distress and Slightly disheveled Head: Normocephalic, without obvious abnormality,  atraumatic Eyes: EOMI Lungs: clear to auscultation bilaterally Heart: regular rate and rhythm and S1, S2 normal Abdomen: soft, NT, ND, BS present Extremities: no edema Skin: mobility and turgor normal Neurologic: Grossly normal  Consultants:   Neurology  Procedures:     Data Reviewed: I have personally reviewed following labs and imaging studies Results for orders placed or performed during the hospital encounter of 06/15/20 (from the past 24 hour(s))  Magnesium     Status: None   Collection Time: 06/19/20  1:02 PM  Result Value Ref Range   Magnesium 1.9 1.7 - 2.4 mg/dL  Glucose, capillary     Status: Abnormal   Collection Time: 06/19/20  4:11 PM  Result Value Ref Range   Glucose-Capillary 107 (H) 70 - 99 mg/dL  Glucose, capillary     Status: Abnormal   Collection Time: 06/19/20  7:53 PM  Result Value Ref Range   Glucose-Capillary 145 (H) 70 - 99 mg/dL  Glucose, capillary     Status: None   Collection Time: 06/20/20 12:21 AM  Result Value Ref Range   Glucose-Capillary 77 70 - 99 mg/dL  Hepatic function panel     Status: Abnormal   Collection Time: 06/20/20  4:02 AM  Result Value Ref Range   Total Protein 6.4 (L) 6.5 - 8.1 g/dL   Albumin 3.1 (L) 3.5 - 5.0 g/dL   AST 85 (H) 15 - 41 U/L   ALT 53 (H) 0 - 44 U/L   Alkaline Phosphatase 127 (H) 38 - 126 U/L   Total Bilirubin 0.7 0.3 - 1.2 mg/dL   Bilirubin, Direct 0.2 0.0 - 0.2 mg/dL   Indirect Bilirubin 0.5 0.3 - 0.9 mg/dL  Glucose, capillary     Status: None   Collection Time: 06/20/20  4:34 AM  Result Value Ref Range   Glucose-Capillary 86 70 - 99 mg/dL  Glucose, capillary     Status: Abnormal   Collection Time: 06/20/20  8:13 AM  Result Value Ref Range   Glucose-Capillary 102 (H) 70 - 99 mg/dL  Glucose, capillary     Status: None   Collection Time: 06/20/20 11:32 AM  Result Value Ref Range   Glucose-Capillary 91 70 - 99 mg/dL    Recent Results (from the past 240 hour(s))  Resp Panel by RT-PCR (Flu A&B, Covid)      Status: None   Collection Time: 06/15/20  9:14 AM   Specimen: Nasopharyngeal(NP) swabs in vial transport medium  Result Value Ref Range Status   SARS Coronavirus 2 by RT PCR NEGATIVE NEGATIVE Final    Comment: (NOTE) SARS-CoV-2 target nucleic acids are NOT DETECTED.  The SARS-CoV-2 RNA is generally detectable in upper respiratory specimens during the acute phase of infection. The lowest concentration of SARS-CoV-2 viral copies this assay can detect is 138 copies/mL. A negative result does not preclude SARS-Cov-2 infection and should not be used as the sole basis for treatment or other patient management decisions. A negative result may occur with  improper specimen collection/handling, submission of specimen other than nasopharyngeal swab, presence of viral mutation(s) within the areas targeted by this assay, and inadequate number of  viral copies(<138 copies/mL). A negative result must be combined with clinical observations, patient history, and epidemiological information. The expected result is Negative.  Fact Sheet for Patients:  BloggerCourse.com  Fact Sheet for Healthcare Providers:  SeriousBroker.it  This test is no t yet approved or cleared by the [place] FDA and  has been authorized for detection and/or diagnosis of SARS-CoV-2 by FDA under an Emergency Use Authorization (EUA). This EUA will remain  in effect (meaning this test can be used) for the duration of the COVID-19 declaration under Section 564(b)(1) of the Act, 21 U.S.C.section 360bbb-3(b)(1), unless the authorization is terminated  or revoked sooner.       Influenza A by PCR NEGATIVE NEGATIVE Final   Influenza B by PCR NEGATIVE NEGATIVE Final    Comment: (NOTE) The Xpert Xpress SARS-CoV-2/FLU/RSV plus assay is intended as an aid in the diagnosis of influenza from Nasopharyngeal swab specimens and should not be used as a sole basis for treatment. Nasal  washings and aspirates are unacceptable for Xpert Xpress SARS-CoV-2/FLU/RSV testing.  Fact Sheet for Patients: BloggerCourse.com  Fact Sheet for Healthcare Providers: SeriousBroker.it  This test is not yet approved or cleared by the [place] FDA and has been authorized for detection and/or diagnosis of SARS-CoV-2 by FDA under an Emergency Use Authorization (EUA). This EUA will remain in effect (meaning this test can be used) for the duration of the COVID-19 declaration under Section 564(b)(1) of the Act, 21 U.S.C. section 360bbb-3(b)(1), unless the authorization is terminated or revoked.  Performed at [facility], [address] [place], [place] [address]      Radiology Studies: US Abdomen Complete  Result Date: 06/19/2020 CLINICAL DATA:  Transaminitis.  History of a cholecystectomy. EXAM: ABDOMEN ULTRASOUND COMPLETE COMPARISON:  CT, 07/28/2019. FINDINGS: Gallbladder: Surgically absent. Common bile duct: Diameter: 9 mm. Liver: . Normal overall parenchymal echogenicity small echogenic lesion in the right lobe inferiorly, nonspecific, possibly a small hemangioma. This is not evident on the prior CT. No other liver masses or lesions. Portal vein is patent on color Doppler imaging with normal direction of blood flow towards the liver. IVC: No abnormality visualized. Pancreas: Visualized portion unremarkable. Spleen: Size and appearance within normal limits. Right Kidney: Length: 11.3 cm. Echogenicity within normal limits. No mass or hydronephrosis visualized. Left Kidney: Length: 10.1 cm. Echogenicity within normal limits. No mass or hydronephrosis visualized. Abdominal aorta: No aneurysm visualized. Other findings: None. IMPRESSION: 1. No acute findings. 2. Small echogenic area in the inferior aspect of the right liver lobe, nonspecific. Possible small area focal fatty infiltration versus a small hemangioma. This was not evident  on the prior CT, which was an unenhanced study, which supports fatty infiltration. This is felt to be incidental not warranting follow-up. 3. Status post cholecystectomy. Electronically Signed   By: Amie Portland M.D.   On: 06/19/2020 14:44   US Abdomen Complete  Final Result    MR BRAIN W WO CONTRAST  Final Result    DG Chest Portable 1 View  Final Result    CT Head Wo Contrast  Final Result      Scheduled Meds: . ALPRAZolam  0.5 mg Oral Q6H  . aspirin EC  81 mg Oral Daily  . buprenorphine  8 mg Sublingual TID  . Chlorhexidine Gluconate Cloth  6 each Topical Daily  . cholecalciferol  2,000 Units Oral Daily  . enoxaparin (LOVENOX) injection  40 mg Subcutaneous Q24H  . fluticasone  1 spray Each Nare QHS  . insulin  aspart  0-9 Units Subcutaneous TID WC  . isosorbide mononitrate  30 mg Oral Daily  . metoprolol tartrate  2.5 mg Intravenous Q8H  . mometasone-formoterol  2 puff Inhalation BID  . nicotine  21 mg Transdermal Daily  . pantoprazole  40 mg Oral Daily  . polyethylene glycol  17 g Oral Daily  . senna-docusate  1 tablet Oral BID  . vitamin B-12  2,500 mcg Oral Daily  . vitamin E  400 Units Oral Daily   PRN Meds: acetaminophen, guaiFENesin-dextromethorphan, LORazepam, ondansetron **OR** ondansetron (ZOFRAN) IV Continuous Infusions: . sodium chloride 75 mL/hr at 06/20/20 0515  . levETIRAcetam 500 mg (06/20/20 0135)     LOS: 3 days  Time spent: Greater than 50% of the 35 minute visit was spent in counseling/coordination of care for the patient as laid out in the A&P.   Lewie Chamber, MD [facility] 06/20/2020, 12:09 PM

## 2020-06-21 DIAGNOSIS — R4182 Altered mental status, unspecified: Secondary | ICD-10-CM | POA: Diagnosis not present

## 2020-06-21 DIAGNOSIS — I10 Essential (primary) hypertension: Secondary | ICD-10-CM | POA: Diagnosis not present

## 2020-06-21 LAB — HEPATIC FUNCTION PANEL
ALT: 34 U/L (ref 0–44)
AST: 25 U/L (ref 15–41)
Albumin: 3.1 g/dL — ABNORMAL LOW (ref 3.5–5.0)
Alkaline Phosphatase: 97 U/L (ref 38–126)
Bilirubin, Direct: <0.1 mg/dL (ref 0.0–0.2)
Total Bilirubin: 0.3 mg/dL (ref 0.3–1.2)
Total Protein: 6.4 g/dL — ABNORMAL LOW (ref 6.5–8.1)

## 2020-06-21 LAB — CBC WITH DIFFERENTIAL/PLATELET
Abs Immature Granulocytes: 0.01 10*3/uL (ref 0.00–0.07)
Basophils Absolute: 0 10*3/uL (ref 0.0–0.1)
Basophils Relative: 1 %
Eosinophils Absolute: 0.2 10*3/uL (ref 0.0–0.5)
Eosinophils Relative: 3 %
HCT: 35.1 % — ABNORMAL LOW (ref 36.0–46.0)
Hemoglobin: 11.7 g/dL — ABNORMAL LOW (ref 12.0–15.0)
Immature Granulocytes: 0 %
Lymphocytes Relative: 26 %
Lymphs Abs: 1.4 10*3/uL (ref 0.7–4.0)
MCH: 32.1 pg (ref 26.0–34.0)
MCHC: 33.3 g/dL (ref 30.0–36.0)
MCV: 96.4 fL (ref 80.0–100.0)
Monocytes Absolute: 0.5 10*3/uL (ref 0.1–1.0)
Monocytes Relative: 9 %
Neutro Abs: 3.4 10*3/uL (ref 1.7–7.7)
Neutrophils Relative %: 61 %
Platelets: 173 10*3/uL (ref 150–400)
RBC: 3.64 MIL/uL — ABNORMAL LOW (ref 3.87–5.11)
RDW: 12.4 % (ref 11.5–15.5)
WBC: 5.6 10*3/uL (ref 4.0–10.5)
nRBC: 0 % (ref 0.0–0.2)

## 2020-06-21 LAB — BASIC METABOLIC PANEL
Anion gap: 8 (ref 5–15)
BUN: 8 mg/dL (ref 8–23)
CO2: 30 mmol/L (ref 22–32)
Calcium: 9.3 mg/dL (ref 8.9–10.3)
Chloride: 101 mmol/L (ref 98–111)
Creatinine, Ser: 0.64 mg/dL (ref 0.44–1.00)
GFR, Estimated: >60 mL/min (ref >60)
Glucose, Bld: 114 mg/dL — ABNORMAL HIGH (ref 70–99)
Potassium: 4.3 mmol/L (ref 3.5–5.1)
Sodium: 139 mmol/L (ref 135–145)

## 2020-06-21 LAB — GLUCOSE, CAPILLARY
Glucose-Capillary: 103 mg/dL — ABNORMAL HIGH (ref 70–99)
Glucose-Capillary: 112 mg/dL — ABNORMAL HIGH (ref 70–99)
Glucose-Capillary: 114 mg/dL — ABNORMAL HIGH (ref 70–99)
Glucose-Capillary: 115 mg/dL — ABNORMAL HIGH (ref 70–99)
Glucose-Capillary: 117 mg/dL — ABNORMAL HIGH (ref 70–99)
Glucose-Capillary: 119 mg/dL — ABNORMAL HIGH (ref 70–99)
Glucose-Capillary: 143 mg/dL — ABNORMAL HIGH (ref 70–99)

## 2020-06-21 LAB — MAGNESIUM: Magnesium: 1.7 mg/dL (ref 1.7–2.4)

## 2020-06-21 MED ORDER — AMPHETAMINE-DEXTROAMPHETAMINE 10 MG PO TABS
10.0000 mg | ORAL_TABLET | Freq: Every day | ORAL | Status: DC
  Administered 2020-06-22 – 2020-06-23 (×2): 10 mg via ORAL
  Filled 2020-06-21 (×2): qty 1

## 2020-06-21 MED ORDER — METOPROLOL SUCCINATE ER 50 MG PO TB24: 50.0000 mg | ORAL_TABLET | Freq: Every day | ORAL | Status: DC

## 2020-06-21 MED ORDER — ALPRAZOLAM 0.5 MG PO TABS
0.5000 mg | ORAL_TABLET | Freq: Three times a day (TID) | ORAL | Status: DC
  Administered 2020-06-21 – 2020-06-22 (×3): 0.5 mg via ORAL
  Filled 2020-06-21 (×4): qty 1

## 2020-06-21 MED ORDER — METOPROLOL SUCCINATE ER 50 MG PO TB24
50.0000 mg | ORAL_TABLET | Freq: Every day | ORAL | Status: DC
  Administered 2020-06-21 – 2020-06-23 (×3): 50 mg via ORAL
  Filled 2020-06-21 (×3): qty 1

## 2020-06-21 MED ORDER — LEVETIRACETAM 500 MG PO TABS
500.0000 mg | ORAL_TABLET | Freq: Two times a day (BID) | ORAL | Status: DC
  Administered 2020-06-21 – 2020-06-23 (×5): 500 mg via ORAL
  Filled 2020-06-21 (×6): qty 1

## 2020-06-21 MED ORDER — OXYCODONE HCL 5 MG PO TABS
5.0000 mg | ORAL_TABLET | ORAL | Status: DC | PRN
  Administered 2020-06-21 (×2): 5 mg via ORAL
  Filled 2020-06-21 (×3): qty 1

## 2020-06-21 MED ORDER — ALPRAZOLAM 0.5 MG PO TABS: 0.5000 mg | ORAL_TABLET | Freq: Three times a day (TID) | ORAL | Status: DC

## 2020-06-21 MED ORDER — MAGNESIUM SULFATE 2 GM/50ML IV SOLN
2.0000 g | Freq: Once | INTRAVENOUS | Status: AC
  Administered 2020-06-21: 09:00:00 2 g via INTRAVENOUS
  Filled 2020-06-21: qty 50

## 2020-06-21 NOTE — Progress Notes (Signed)
Occupational Therapy Treatment Patient Details Name: Jacoya Bauman MRN: [id number] DOB: Jun 03, 1955 Today's Date: 06/21/2020    History of present illness 65 year old female with history of hypertension, diabetes mellitus type 2, COPD, nicotine dependence, OSA, GERD, anxiety, depression was brought to the ER by EMS for evaluation of possible seizure episode and mental status change.   OT comments  Pt seen for OT treatment on this date. Upon arrival to room, pt awake and seated upright in recliner on 2L of O2 via [place]. Pt alert upon arrival and during functional mobility, however appearing lethargic and closing eyes toward end of session. Pt was agreeable to tx. Pt presents with decreased activity tolerance and balance, and requires MIN GUARD for functional mobility, MIN GUARD for toilet transfers/hygiene, and MIN GUARD for standing hand hygiene. Pt participated in seated UB/LB therapeutic exercises and was instructed to perform x3/day, with pt verbalizing understanding. Of note, pt on 2L/min of O2 throughout session, with SpO2 >91% and HR MAX 130s during standing hand hygiene (HR 100s-110s at rest); RN informed. Pt is making good progress toward goals and continues to benefit from skilled OT services to maximize return to PLOF and minimize risk of future falls, injury, caregiver burden, and readmission. Will continue to follow POC. Discharge recommendation remains appropriate.    Follow Up Recommendations  Home health OT    Equipment Recommendations  Other (comment);3 in 1 bedside commode (RW)       Precautions / Restrictions Precautions Precautions: Fall Restrictions Weight Bearing Restrictions: No       Mobility Bed Mobility               General bed mobility comments: In recliner at beginning/end of session    Transfers Overall transfer level: Needs assistance Equipment used: 1 person hand held assist Transfers: Sit to/from Stand Sit to Stand: Min guard         General  transfer comment: Pt able to rise w/o assist but needed UEs stabilized when initially upright    Balance Overall balance assessment: Needs assistance Sitting-balance support: No upper extremity supported;Feet supported Sitting balance-Leahy Scale: Good Sitting balance - Comments: Good sitting balance reaching within BOS while seated in recliner   Standing balance support: Bilateral upper extremity supported;During functional activity Standing balance-Leahy Scale: Fair                             ADL either performed or assessed with clinical judgement   ADL Overall ADL's : Needs assistance/impaired     Grooming: Wash/dry hands;Min Production designer, theatre/television/film: Min guard;Regular Toilet;Grab bars   Toileting- Architect and Hygiene: Min guard;Sitting/lateral lean       Functional mobility during ADLs: Min guard (to walk to/from toilet (~25 ft total))                 Cognition Arousal/Alertness: Lethargic Behavior During Therapy: WFL for tasks assessed/performed Overall Cognitive Status: Difficult to assess                                 General Comments: Pt alert upon arrival and during functional mobility, however appearing lethargic toward end of session and closing eyes during seated therapy exercises        Exercises General Exercises - Upper Extremity Shoulder Flexion:  AROM;Strengthening;Both;10 reps;Seated General Exercises - Lower Extremity Ankle Circles/Pumps: AROM;Both;10 reps;Seated Long Arc Quad: AROM;Both;10 reps;Seated Hip Flexion/Marching: AROM;Both;10 reps;Seated           Pertinent Vitals/ Pain       Pain Assessment: 0-10 Pain Score: 10-Worst pain ever Pain Location: Chronic neck/back pain Pain Descriptors / Indicators: Aching;Discomfort Pain Intervention(s): Limited activity within patient's tolerance;Repositioned;Monitored during session         Frequency  Min 2X/week         Progress Toward Goals  OT Goals(current goals can now be found in the care plan section)  Progress towards OT goals: Progressing toward goals  Acute Rehab OT Goals Patient Stated Goal: go home OT Goal Formulation: With patient Time For Goal Achievement: 07/04/20 Potential to Achieve Goals: Good  Plan Discharge plan remains appropriate       AM-PAC OT "6 Clicks" Daily Activity     Outcome Measure   Help from another person eating meals?: None Help from another person taking care of personal grooming?: A Little Help from another person toileting, which includes using toliet, bedpan, or urinal?: A Little Help from another person bathing (including washing, rinsing, drying)?: A Little Help from another person to put on and taking off regular upper body clothing?: None Help from another person to put on and taking off regular lower body clothing?: A Little 6 Click Score: 20    End of Session Equipment Utilized During Treatment: Oxygen;Other (comment) (pt initiating b/l UE stabilization via IV pole during functional mobility)  OT Visit Diagnosis: Unsteadiness on feet (R26.81);Muscle weakness (generalized) (M62.81)   Activity Tolerance Patient tolerated treatment well   Patient Left in bed;with call bell/phone within reach;with bed alarm set   Nurse Communication Mobility status        Time: 7867-5449 OT Time Calculation (min): 28 min  Charges: OT General Charges $OT Visit: 1 Visit OT Treatments $Self Care/Home Management : 23-37 mins  Matthew Folks, OTR/L [facility] [phone]

## 2020-06-21 NOTE — TOC Initial Note (Signed)
Transition of Care [facility]) - Initial/Assessment Note    Patient Details  Name: Angela Daniel MRN: [id number] Date of Birth: 08/01/1955  [facility]) CM/SW Contact:    Allayne Butcher, RN Phone Number: 06/21/2020, 1:52 PM  Clinical Narrative:                 Patient admitted to the hospital with altered mental status and seizure activity.  RNCM was able to meet with patient at the bedside this morning.  Patient lives with her daughter in [place].  Patient is independent and drives at baseline.  PT is currently recommending home health services.  Patient agrees to receiving home health and would also like to have a walker.  [facility] will provide walker and deliver to the patient's room.  Barbara Cower with [facility] has agreed to accept patient for [facility] RN, PT, and OT.  [facility] will not be able to see patient until Monday, but no other HH agency able to see any sooner.  Daughter will pick up patient at discharge.   Expected Discharge Plan: Home w Home Health Services Barriers to Discharge: Continued Medical Work up   Patient Goals and CMS Choice Patient states their goals for this hospitalization and ongoing recovery are:: Patient wants to go home CMS Medicare.gov Compare Post Acute Care list provided to:: Patient Choice offered to / list presented to : Patient  Expected Discharge Plan and Services Expected Discharge Plan: Home w Home Health Services   Discharge Planning Services: CM Consult Post Acute Care Choice: Home Health Living arrangements for the past 2 months: Single Family Home                 DME Arranged: Walker rolling DME Agency: [facility] Date DME Agency Contacted: 06/21/20 Time DME Agency Contacted: 1339 Representative spoke with at DME Agency: Bjorn Loser HH Arranged: RN,PT,OT HH Agency: [facility] ([facility]) Date HH Agency Contacted: 06/21/20 Time HH Agency Contacted: 1351 Representative spoke with at [facility] Agency: Barbara Cower  Prior Living  Arrangements/Services Living arrangements for the past 2 months: Single Family Home Lives with:: Adult Children Patient language and need for interpreter reviewed:: Yes Do you feel safe going back to the place where you live?: Yes      Need for Family Participation in Patient Care: Yes (Comment) Care giver support system in place?: Yes (comment) (daughter)   Criminal Activity/Legal Involvement Pertinent to Current Situation/Hospitalization: No - Comment as needed  Activities of Daily Living Home Assistive Devices/Equipment: Oxygen,CPAP ADL Screening (condition at time of admission) Patient's cognitive ability adequate to safely complete daily activities?: No Is the patient deaf or have difficulty hearing?: No Does the patient have difficulty seeing, even when wearing glasses/contacts?: No Does the patient have difficulty concentrating, remembering, or making decisions?: No Patient able to express need for assistance with ADLs?: Yes Does the patient have difficulty dressing or bathing?: No Independently performs ADLs?: No Communication: Independent Dressing (OT): Needs assistance Is this a change from baseline?: Change from baseline, expected to last >3 days Grooming: Needs assistance Is this a change from baseline?: Change from baseline, expected to last >3 days Feeding: Needs assistance Is this a change from baseline?: Change from baseline, expected to last >3 days Bathing: Needs assistance Is this a change from baseline?: Change from baseline, expected to last >3 days Toileting: Needs assistance Is this a change from baseline?: Change from baseline, expected to last >3days In/Out Bed: Needs assistance Is this a change from baseline?: Change  from baseline, expected to last >3 days Walks in Home: Needs assistance Is this a change from baseline?: Change from baseline, expected to last >3 days Does the patient have difficulty walking or climbing stairs?: Yes Weakness of Legs:  Both Weakness of Arms/Hands: None  Permission Sought/Granted Permission sought to share information with : Case Manager,Family Supports,Other (comment) Permission granted to share information with : Yes, Verbal Permission Granted  Share Information with NAME: Marcelino Duster  Permission granted to share info w AGENCY: [facility]  Permission granted to share info w Relationship: daughter     Emotional Assessment Appearance:: Appears older than stated age Attitude/Demeanor/Rapport: Engaged Affect (typically observed): Accepting Orientation: : Oriented to Self,Oriented to Place,Oriented to  Time,Oriented to Situation Alcohol / Substance Use: Not Applicable Psych Involvement: No (comment)  Admission diagnosis:  Polypharmacy [Z79.899] Seizure-like activity (HCC) [R56.9] Altered mental status, unspecified altered mental status type [R41.82] AMS (altered mental status) [R41.82] Patient Active Problem List   Diagnosis Date Noted  . AMS (altered mental status) 06/15/2020  . Nicotine dependence 06/15/2020  . Seizure (HCC) 06/15/2020  . Lactic acidosis 06/15/2020  . COPD (chronic obstructive pulmonary disease) (HCC)   . Hypertension   . GERD (gastroesophageal reflux disease)   . Fibromyalgia   . Diabetes mellitus without complication (HCC)   . Congestive heart failure (HCC)   . Acute metabolic encephalopathy   . HLD (hyperlipidemia)   . Depression with anxiety   . Acute respiratory failure with hypoxia (HCC) 11/07/2014  . COPD exacerbation (HCC) 11/07/2014  . CAP (community acquired pneumonia) 11/07/2014   PCP:  [facility], NP Pharmacy:   [phone] - [place], [place] - [address] [place] [address] Phone: [phone] Fax: [phone]     Social Determinants of Health (SDOH) Interventions    Readmission Risk Interventions No flowsheet data found.

## 2020-06-21 NOTE — Progress Notes (Addendum)
Neurology Progress Note   S:// Recalled for continuing waxing/waning mentation On my exam, awake, sitting in chair pretty much same as yesterday Complaining of severe back and neck pain  O:// Current vital signs: BP 136/84 (BP Location: Right Arm)   Pulse 99   Temp 97.8 F (36.6 C) (Oral)   Resp 16   Ht 5\' 3"  (1.6 m)   Wt 63 kg   SpO2 100%   BMI 24.60 kg/m  Vital signs in last 24 hours: Temp:  [97.7 F (36.5 C)-98.3 F (36.8 C)] 97.8 F (36.6 C) (06/01 1138) Pulse Rate:  [62-99] 99 (06/01 1138) Resp:  [14-16] 16 (06/01 1138) BP: (113-136)/(71-84) 136/84 (06/01 1138) SpO2:  [96 %-100 %] 100 % (06/01 1138) General: sitting in chair, NAD HEENT: Normocephalic/atraumatic Respiratory: Clear Cardiovascular: Regular rate rhythm Abdomen soft nondistended nontender Extremities cool and mildly discolored fingers b/l Neurological exam AAOx3 Mildly dysarthric No aphasia CN 2-12 intact Motor: 5/5 in all 4s Sensation intact to LT Coord: no dysmetria although slow to perform. Gait deferred Unchanged from yesterday  Medications  Current Facility-Administered Medications:  .  0.9 %  sodium chloride infusion, , Intravenous, Continuous, 10-05-1987, MD, Last Rate: 75 mL/hr at 06/20/20 0515, New Bag at 06/20/20 0515 .  acetaminophen (TYLENOL) tablet 650 mg, 650 mg, Oral, Q6H PRN, 06/22/20, MD, 650 mg at 06/18/20 2328 .  ALPRAZolam (XANAX) tablet 0.5 mg, 0.5 mg, Oral, TID, Regalado, Belkys A, MD .  2329 ON 06/22/2020] amphetamine-dextroamphetamine (ADDERALL) tablet 10 mg, 10 mg, Oral, Q breakfast, Regalado, Belkys A, MD .  aspirin EC tablet 81 mg, 81 mg, Oral, Daily, 08/22/2020, Sharl Ma, MD, 81 mg at 06/21/20 0848 .  buprenorphine (SUBUTEX) SL tablet 8 mg, 8 mg, Sublingual, TID, 08/21/20, Sharl Ma, MD, 8 mg at 06/21/20 0846 .  Chlorhexidine Gluconate Cloth 2 % PADS 6 each, 6 each, Topical, Daily, 08/21/20, MD, 6 each at 06/21/20 0850 .  cholecalciferol (VITAMIN D3) tablet 2,000  Units, 2,000 Units, Oral, Daily, 08/21/20, MD, 2,000 Units at 06/21/20 0847 .  enoxaparin (LOVENOX) injection 40 mg, 40 mg, Subcutaneous, Q24H, 08/21/20, Sharl Ma, MD, 40 mg at 06/20/20 2257 .  fluticasone (FLONASE) 50 MCG/ACT nasal spray 1 spray, 1 spray, Each Nare, QHS, 06/22/20, MD, 1 spray at 06/20/20 2257 .  guaiFENesin-dextromethorphan (ROBITUSSIN DM) 100-10 MG/5ML syrup 5 mL, 5 mL, Oral, Q4H PRN, 2258, Sharl Ma, MD, 5 mL at 06/20/20 0001 .  insulin aspart (novoLOG) injection 0-9 Units, 0-9 Units, Subcutaneous, TID WC, Lama, 06/22/20, MD .  isosorbide mononitrate (IMDUR) 24 hr tablet 30 mg, 30 mg, Oral, Daily, Sarina Ill, Sharl Ma, MD, 30 mg at 06/21/20 0846 .  levETIRAcetam (KEPPRA) tablet 500 mg, 500 mg, Oral, BID, Regalado, Belkys A, MD, 500 mg at 06/21/20 1235 .  LORazepam (ATIVAN) injection 2 mg, 2 mg, Intravenous, Q4H PRN, 08/21/20, Sharl Ma, MD .  metoprolol succinate (TOPROL-XL) 24 hr tablet 50 mg, 50 mg, Oral, Daily, Regalado, Belkys A, MD .  mometasone-formoterol (DULERA) 200-5 MCG/ACT inhaler 2 puff, 2 puff, Inhalation, BID, Sarina Ill, Sharl Ma, MD, 2 puff at 06/21/20 0848 .  nicotine (NICODERM CQ - dosed in mg/24 hours) patch 21 mg, 21 mg, Transdermal, Daily, 08/21/20, Sharl Ma, MD, 21 mg at 06/21/20 0850 .  ondansetron (ZOFRAN) tablet 4 mg, 4 mg, Oral, Q6H PRN **OR** ondansetron (ZOFRAN) injection 4 mg, 4 mg, Intravenous, Q6H PRN, 08/21/20, Sharl Ma, MD .  pantoprazole (PROTONIX) EC tablet  40 mg, 40 mg, Oral, Daily, Meredeth Ide, MD, 40 mg at 06/21/20 0847 .  polyethylene glycol (MIRALAX / GLYCOLAX) packet 17 g, 17 g, Oral, Daily, Lewie Chamber, MD, 17 g at 06/21/20 0850 .  senna-docusate (Senokot-S) tablet 1 tablet, 1 tablet, Oral, BID, Lewie Chamber, MD, 1 tablet at 06/21/20 0848 .  vitamin B-12 (CYANOCOBALAMIN) tablet 2,500 mcg, 2,500 mcg, Oral, Daily, Meredeth Ide, MD, 2,500 mcg at 06/21/20 0846 .  vitamin E capsule 400 Units, 400 Units, Oral, Daily, Meredeth Ide, MD, 400 Units at 06/21/20  0845  Labs CBC    Component Value Date/Time   WBC 5.6 06/21/2020 0430   RBC 3.64 (L) 06/21/2020 0430   HGB 11.7 (L) 06/21/2020 0430   HGB 12.9 06/28/2012 2218   HCT 35.1 (L) 06/21/2020 0430   HCT 37.3 06/28/2012 2218   PLT 173 06/21/2020 0430   PLT 218 06/28/2012 2218   MCV 96.4 06/21/2020 0430   MCV 91 06/28/2012 2218   MCH 32.1 06/21/2020 0430   MCHC 33.3 06/21/2020 0430   RDW 12.4 06/21/2020 0430   RDW 13.2 06/28/2012 2218   LYMPHSABS 1.4 06/21/2020 0430   MONOABS 0.5 06/21/2020 0430   EOSABS 0.2 06/21/2020 0430   BASOSABS 0.0 06/21/2020 0430    CMP     Component Value Date/Time   NA 139 06/21/2020 0430   NA 139 06/28/2012 2218   K 4.3 06/21/2020 0430   K 3.7 06/28/2012 2218   CL 101 06/21/2020 0430   CL 108 (H) 06/28/2012 2218   CO2 30 06/21/2020 0430   CO2 25 06/28/2012 2218   GLUCOSE 114 (H) 06/21/2020 0430   GLUCOSE 118 (H) 06/28/2012 2218   BUN 8 06/21/2020 0430   BUN 9 06/28/2012 2218   CREATININE 0.64 06/21/2020 0430   CREATININE 0.73 06/28/2012 2218   CALCIUM 9.3 06/21/2020 0430   CALCIUM 8.8 06/28/2012 2218   PROT 6.4 (L) 06/21/2020 0430   ALBUMIN 3.1 (L) 06/21/2020 0430   AST 25 06/21/2020 0430   ALT 34 06/21/2020 0430   ALKPHOS 97 06/21/2020 0430   BILITOT 0.3 06/21/2020 0430   GFRNONAA >60 06/21/2020 0430   GFRNONAA >60 06/28/2012 2218   GFRAA >60 07/27/2019 2323   GFRAA >60 06/28/2012 2218   EEG:    Imaging I have reviewed images in epic and the results pertinent to this consultation are: CT head with no acute changes MRI brain with and without contrast-no acute intracranial abnormality.  Moderate T2/flair hyperintensity in the cerebral white matter-nonspecific but most commonly related to chronic microvascular ischemic disease.  Assessment:  65 year old woman with a history of benzodiazepine withdrawal seizures admitted with encephalopathy.  Urinary toxicology screen negative for benzodiazepine. Multifactorial toxic metabolic  encephalopathy likely secondary to benzodiazepine withdrawal-question If there was a seizure preceding as on presentation she had some foaming at the mouth. Extensive pain and pain meds history and extensive polypharmacy. Also on antidepressants and stimulants in the past.  Impression:  Multifactorial toxic metabolic encephalopathy  Benzodiazepine withdrawal seizure-not the first time-- now resolved  Polypharmacy  Recommendations:  Agree with discontinuing gabapentin-as she is currently on Keppra.  Continue Keppra 500 BID  Stimulants were discontinued with concern for sz. Can resume Adderall at 10 mg qam  Do not abruptly taper benzos. Dr Sunnie Nielsen has changed Xanax to 0.5 TID given her increased somnolence at times.  She has not received her benzo and  AED today - I have contacted the RN via secure chat to  administer now.  Consider psychiatry input regarding medication optimization  D/W Dr Sunnie Nielsen over phone and securechat   -- Milon Dikes, MD Neurologist [facility] Pager: [phone]

## 2020-06-21 NOTE — Care Management Important Message (Signed)
Important Message  Patient Details  Name: Angela Daniel MRN: [id number] Date of Birth: Mar 08, 1955   Medicare Important Message Given:  Yes     Olegario Messier A [name] 06/21/2020, 11:33 AM

## 2020-06-21 NOTE — Progress Notes (Signed)
PROGRESS NOTE    Angela Daniel  JJK:[id number] DOB: [birth year] DOA: 06/15/2020 PCP: [facility], NP   Brief Narrative: 65 year old with past medical history significant for hypertension, diabetes type 2, COPD, nicotine dependence, OSA, GERD, anxiety, depression who was brought by ER by EMS for evaluation of possible seizure episode of mental status changes.  Patient was noted to have tonic clonic seizure episode the day of admission.  Patient is on chronic Xanax.  UDS was negative for Xanax.  Patient reported that she might have forgotten to take Xanax. Patient Xanax was restarted decreased dose and plan to taper as an outpatient.   Assessment & Plan:   Principal Problem:   AMS (altered mental status) Active Problems:   Hypertension   GERD (gastroesophageal reflux disease)   Diabetes mellitus without complication (HCC)   Depression with anxiety   Nicotine dependence   Seizure (HCC)   Lactic acidosis   1-Acute toxic metabolic encephalopathy: Post ictal state from seizure versus polypharmacy Patient was noted to have witnessed seizure at home. Patient was a started on IV Keppra by neurology. Patient on multiple psychotropic medication: Xanax, gabapentin and Abilify 5 buprenorphine and Adderall Gabapentin and Adderall have been discontinued Xanax dose reduced to 0.5 every 6 hours, plan to taper fever off as an outpatient Plan to monitor overnight on resume dose of Xanax  Seizure: Presume related to benzodiazepine withdrawal Continue with Keppra.  Diabetes type 2: Metformin on hold Continue with sliding scale insulin  Hypomagnesemia: Replete  Lactic acidosis: From seizure, resolved  Hypertension: Change IV metoprolol to oral  Opioid dependence: Continue with buprenorphine 8 mg 3 times daily Transaminases;  Ultrasound unremarkable.  Resolved     Estimated body mass index is 24.6 kg/m as calculated from the following:   Height as of this encounter: 5'  3" (1.6 m).   Weight as of this encounter: 63 kg.   DVT prophylaxis: Lovenox Code Status: Full code Family Communication: No family at bedside.  Disposition Plan:  Status is: Inpatient  Remains inpatient appropriate because:IV treatments appropriate due to intensity of illness or inability to take PO   Dispo: The patient is from: Home              Anticipated d/c is to: Home              Patient currently is not medically stable to d/c.   Difficult to place patient No        Consultants:   Neurology   Procedures:   None  Antimicrobials:    Subjective: Wake up answer question. Asking to go home.  Appears sleepy  Objective: Vitals:   06/21/20 0036 06/21/20 0414 06/21/20 0751 06/21/20 1138  BP: 129/71 128/79 123/74 136/84  Pulse: 72 76 69 99  Resp: 15 14 16 16   Temp: 97.9 F (36.6 C) 97.9 F (36.6 C) 97.7 F (36.5 C) 97.8 F (36.6 C)  TempSrc:   Oral Oral  SpO2: 100% 96% 100% 100%  Weight:      Height:        Intake/Output Summary (Last 24 hours) at 06/21/2020 1447 Last data filed at 06/21/2020 1418 Gross per 24 hour  Intake 60 ml  Output 1025 ml  Net -965 ml   Filed Weights   06/15/20 0739  Weight: 63 kg    Examination:  General exam: Appears calm and comfortable  Respiratory system: Clear to auscultation. Respiratory effort normal. Cardiovascular system: S1 & S2 heard, RRR. No JVD,  murmurs, rubs, gallops or clicks. No pedal edema. Gastrointestinal system: Abdomen is nondistended, soft and nontender. No organomegaly or masses felt. Normal bowel sounds heard. Central nervous system: Alert and oriented. Follows command.  Extremities: Symmetric 5 x 5 power.    Data Reviewed: I have personally reviewed following labs and imaging studies  CBC: Recent Labs  Lab 06/15/20 0749 06/16/20 0535 06/17/20 0423 06/18/20 0448 06/21/20 0430  WBC 9.0 6.1 13.7* 6.4 5.6  NEUTROABS 7.7  --   --   --  3.4  HGB 12.6 12.3 13.3 12.8 11.7*  HCT 37.4 36.6  39.0 38.2 35.1*  MCV 92.6 94.6 92.2 94.8 96.4  PLT 210 191 153 149* 173   Basic Metabolic Panel: Recent Labs  Lab 06/15/20 0749 06/16/20 0535 06/17/20 0423 06/18/20 0448 06/19/20 1302 06/21/20 0430  NA 137 141 140 137  --  139  K 3.3* 3.7 4.9 4.5  --  4.3  CL 94* 104 103 101  --  101  CO2 29 29 27 27   --  30  GLUCOSE 130* 104* 87 94  --  114*  BUN 17 15 14 13   --  8  CREATININE 1.04* 0.66 0.66 0.56  --  0.64  CALCIUM 8.9 8.4* 9.0 8.9  --  9.3  MG 1.6*  --   --  1.5* 1.9 1.7   GFR: Estimated Creatinine Clearance: 61.8 mL/min (by C-G formula based on SCr of 0.64 mg/dL). Liver Function Tests: Recent Labs  Lab 06/15/20 0749 06/18/20 0448 06/20/20 0402 06/21/20 0430  AST 20 244* 85* 25  ALT 9 82* 53* 34  ALKPHOS 61 99 127* 97  BILITOT 0.4 0.9 0.7 0.3  PROT 7.1 6.6 6.4* 6.4*  ALBUMIN 3.6 3.3* 3.1* 3.1*   No results for input(s): LIPASE, AMYLASE in the last 168 hours. No results for input(s): AMMONIA in the last 168 hours. Coagulation Profile: No results for input(s): INR, PROTIME in the last 168 hours. Cardiac Enzymes: Recent Labs  Lab 06/15/20 1032  CKTOTAL 775*   BNP (last 3 results) No results for input(s): PROBNP in the last 8760 hours. HbA1C: No results for input(s): HGBA1C in the last 72 hours. CBG: Recent Labs  Lab 06/20/20 2004 06/21/20 0039 06/21/20 0416 06/21/20 0808 06/21/20 1206  GLUCAP 104* 112* 114* 103* 117*   Lipid Profile: No results for input(s): CHOL, HDL, LDLCALC, TRIG, CHOLHDL, LDLDIRECT in the last 72 hours. Thyroid Function Tests: No results for input(s): TSH, T4TOTAL, FREET4, T3FREE, THYROIDAB in the last 72 hours. Anemia Panel: No results for input(s): VITAMINB12, FOLATE, FERRITIN, TIBC, IRON, RETICCTPCT in the last 72 hours. Sepsis Labs: Recent Labs  Lab 06/15/20 0749 06/15/20 1032  LATICACIDVEN 2.0* 1.3    Recent Results (from the past 240 hour(s))  Resp Panel by RT-PCR (Flu A&B, Covid)     Status: None   Collection  Time: 06/15/20  9:14 AM   Specimen: Nasopharyngeal(NP) swabs in vial transport medium  Result Value Ref Range Status   SARS Coronavirus 2 by RT PCR NEGATIVE NEGATIVE Final    Comment: (NOTE) SARS-CoV-2 target nucleic acids are NOT DETECTED.  The SARS-CoV-2 RNA is generally detectable in upper respiratory specimens during the acute phase of infection. The lowest concentration of SARS-CoV-2 viral copies this assay can detect is 138 copies/mL. A negative result does not preclude SARS-Cov-2 infection and should not be used as the sole basis for treatment or other patient management decisions. A negative result may occur with  improper specimen collection/handling, submission  of specimen other than nasopharyngeal swab, presence of viral mutation(s) within the areas targeted by this assay, and inadequate number of viral copies(<138 copies/mL). A negative result must be combined with clinical observations, patient history, and epidemiological information. The expected result is Negative.  Fact Sheet for Patients:  BloggerCourse.com  Fact Sheet for Healthcare Providers:  SeriousBroker.it  This test is no t yet approved or cleared by the [place] FDA and  has been authorized for detection and/or diagnosis of SARS-CoV-2 by FDA under an Emergency Use Authorization (EUA). This EUA will remain  in effect (meaning this test can be used) for the duration of the COVID-19 declaration under Section 564(b)(1) of the Act, 21 U.S.C.section 360bbb-3(b)(1), unless the authorization is terminated  or revoked sooner.       Influenza A by PCR NEGATIVE NEGATIVE Final   Influenza B by PCR NEGATIVE NEGATIVE Final    Comment: (NOTE) The Xpert Xpress SARS-CoV-2/FLU/RSV plus assay is intended as an aid in the diagnosis of influenza from Nasopharyngeal swab specimens and should not be used as a sole basis for treatment. Nasal washings and aspirates are  unacceptable for Xpert Xpress SARS-CoV-2/FLU/RSV testing.  Fact Sheet for Patients: BloggerCourse.com  Fact Sheet for Healthcare Providers: SeriousBroker.it  This test is not yet approved or cleared by the [place] FDA and has been authorized for detection and/or diagnosis of SARS-CoV-2 by FDA under an Emergency Use Authorization (EUA). This EUA will remain in effect (meaning this test can be used) for the duration of the COVID-19 declaration under Section 564(b)(1) of the Act, 21 U.S.C. section 360bbb-3(b)(1), unless the authorization is terminated or revoked.  Performed at [facility], [address], [place], [place] [address]          Radiology Studies: No results found.      Scheduled Meds: . ALPRAZolam  0.5 mg Oral Q6H  . aspirin EC  81 mg Oral Daily  . buprenorphine  8 mg Sublingual TID  . Chlorhexidine Gluconate Cloth  6 each Topical Daily  . cholecalciferol  2,000 Units Oral Daily  . enoxaparin (LOVENOX) injection  40 mg Subcutaneous Q24H  . fluticasone  1 spray Each Nare QHS  . insulin aspart  0-9 Units Subcutaneous TID WC  . isosorbide mononitrate  30 mg Oral Daily  . levETIRAcetam  500 mg Oral BID  . metoprolol succinate  50 mg Oral Daily  . mometasone-formoterol  2 puff Inhalation BID  . nicotine  21 mg Transdermal Daily  . pantoprazole  40 mg Oral Daily  . polyethylene glycol  17 g Oral Daily  . senna-docusate  1 tablet Oral BID  . vitamin B-12  2,500 mcg Oral Daily  . vitamin E  400 Units Oral Daily   Continuous Infusions: . sodium chloride 75 mL/hr at 06/20/20 0515     LOS: 4 days    Time spent: 35 minutes    Lareen Mullings A Tye Juarez, MD [facility]   If 7PM-7AM, please contact night-coverage [email or web address]  06/21/2020, 2:47 PM

## 2020-06-22 DIAGNOSIS — I10 Essential (primary) hypertension: Secondary | ICD-10-CM | POA: Diagnosis not present

## 2020-06-22 LAB — CBC WITH DIFFERENTIAL/PLATELET
Abs Immature Granulocytes: 0.01 10*3/uL (ref 0.00–0.07)
Basophils Absolute: 0 10*3/uL (ref 0.0–0.1)
Basophils Relative: 1 %
Eosinophils Absolute: 0.2 10*3/uL (ref 0.0–0.5)
Eosinophils Relative: 3 %
HCT: 34.1 % — ABNORMAL LOW (ref 36.0–46.0)
Hemoglobin: 11.3 g/dL — ABNORMAL LOW (ref 12.0–15.0)
Immature Granulocytes: 0 %
Lymphocytes Relative: 27 %
Lymphs Abs: 1.5 10*3/uL (ref 0.7–4.0)
MCH: 31.9 pg (ref 26.0–34.0)
MCHC: 33.1 g/dL (ref 30.0–36.0)
MCV: 96.3 fL (ref 80.0–100.0)
Monocytes Absolute: 0.5 10*3/uL (ref 0.1–1.0)
Monocytes Relative: 8 %
Neutro Abs: 3.4 10*3/uL (ref 1.7–7.7)
Neutrophils Relative %: 61 %
Platelets: 189 10*3/uL (ref 150–400)
RBC: 3.54 MIL/uL — ABNORMAL LOW (ref 3.87–5.11)
RDW: 12.4 % (ref 11.5–15.5)
WBC: 5.6 10*3/uL (ref 4.0–10.5)
nRBC: 0 % (ref 0.0–0.2)

## 2020-06-22 LAB — BASIC METABOLIC PANEL
Anion gap: 7 (ref 5–15)
BUN: 7 mg/dL — ABNORMAL LOW (ref 8–23)
CO2: 30 mmol/L (ref 22–32)
Calcium: 9.2 mg/dL (ref 8.9–10.3)
Chloride: 103 mmol/L (ref 98–111)
Creatinine, Ser: 0.5 mg/dL (ref 0.44–1.00)
GFR, Estimated: >60 mL/min (ref >60)
Glucose, Bld: 98 mg/dL (ref 70–99)
Potassium: 4.1 mmol/L (ref 3.5–5.1)
Sodium: 140 mmol/L (ref 135–145)

## 2020-06-22 LAB — GLUCOSE, CAPILLARY
Glucose-Capillary: 103 mg/dL — ABNORMAL HIGH (ref 70–99)
Glucose-Capillary: 93 mg/dL (ref 70–99)
Glucose-Capillary: 130 mg/dL — ABNORMAL HIGH (ref 70–99)
Glucose-Capillary: 134 mg/dL — ABNORMAL HIGH (ref 70–99)
Glucose-Capillary: 100 mg/dL — ABNORMAL HIGH (ref 70–99)

## 2020-06-22 LAB — MAGNESIUM: Magnesium: 1.8 mg/dL (ref 1.7–2.4)

## 2020-06-22 MED ORDER — ALPRAZOLAM 1 MG PO TABS
1.0000 mg | ORAL_TABLET | Freq: Three times a day (TID) | ORAL | Status: DC
  Administered 2020-06-22 – 2020-06-23 (×2): 1 mg via ORAL
  Filled 2020-06-22 (×2): qty 1

## 2020-06-22 MED ORDER — MAGNESIUM SULFATE 2 GM/50ML IV SOLN
2.0000 g | Freq: Once | INTRAVENOUS | Status: AC
  Administered 2020-06-22: 09:00:00 2 g via INTRAVENOUS
  Filled 2020-06-22: qty 50

## 2020-06-22 MED ORDER — MAGNESIUM OXIDE -MG SUPPLEMENT 400 (240 MG) MG PO TABS
400.0000 mg | ORAL_TABLET | Freq: Two times a day (BID) | ORAL | Status: DC
  Administered 2020-06-22 – 2020-06-23 (×3): 400 mg via ORAL
  Filled 2020-06-22 (×3): qty 1

## 2020-06-22 NOTE — Consult Note (Signed)
[facility] Face-to-Face Psychiatry Consult   Reason for Consult: Patient seen in consult because of concern about her outpatient medicine Referring Physician: Regalado Patient Identification: Angela Daniel MRN:  [id number] Principal Diagnosis: AMS (altered mental status) Diagnosis:  Principal Problem:   AMS (altered mental status) Active Problems:   Hypertension   GERD (gastroesophageal reflux disease)   Diabetes mellitus without complication (HCC)   Depression with anxiety   Nicotine dependence   Seizure (HCC)   Lactic acidosis   Total Time spent with patient: 1 hour  Subjective:   Angela Daniel is a 65 y.o. female patient admitted with "I really do not know".  HPI: Patient seen chart reviewed.  Patient was brought in from home with altered mental status but this time apparently with some confirmatory evidence that she may have had a seizure.  Patient has been seen by neurology and has had a work-up at this point has returned to her baseline mental state.  Concern was raised about whether her condition could be related to her prescription medicine.  Patient is prescribed 2 mg of alprazolam 3 times a day, 8 mg of Subutex 3 times a day and has just recently had her Adderall increased to 60 mg/day.  Looking at the controlled substance database her last prescription fill of her Xanax was April 29 which certainly could have led to her having run out of her Xanax early and had that caused a seizure.  Patient tells me that she had in fact run out of the pills for a couple days before coming into the hospital.  Patient states that her current mood is fine.  Denies any depression.  Denies feeling anxious.  Denies suicidal or homicidal thoughts denies hallucinations.  Patient says that neither she nor anyone around her has ever thought that her high doses of controlled substances were in any way a problem for her.  She cannot really describe what symptoms she had that started her use of Xanax saying she  has been taking it perhaps for 20 years.  The Subutex apparently is being prescribed because there was concern she might have been abusing Percocets for her chronic pain.  Unclear what may have triggered starting on the Adderall.  Past Psychiatric History: Patient denies any hospitalizations.  Denies suicide attempts.  Denies psychotic symptoms.  When asked why she started seeing a psychiatrist in the first place talks about vaguely having some problems with her husband and that sort of thing many years ago.  Patient appears to have been chronically on quite high doses of multiple controlled substances.    Risk to Self:   Risk to Others:   Prior Inpatient Therapy:   Prior Outpatient Therapy:    Past Medical History:  Past Medical History:  Diagnosis Date  . Congestive heart failure (HCC)   . COPD (chronic obstructive pulmonary disease) (HCC)   . Degenerative joint disease   . Diabetes mellitus without complication (HCC)   . Fibromyalgia   . GERD (gastroesophageal reflux disease)   . Hypertension   . Scoliosis   . Sleep apnea     Past Surgical History:  Procedure Laterality Date  . ABDOMINAL HYSTERECTOMY    . APPENDECTOMY    . CESAREAN SECTION     x3  . CHOLECYSTECTOMY    . HIP SURGERY Left    joint replacement  . KYPHOPLASTY N/A 05/18/2019   Procedure: T6 KYPHOPLASTY;  Surgeon: Kennedy Bucker, MD;  Location: [facility] ORS;  Service: Orthopedics;  Laterality: N/A;  Family History:  Family History  Problem Relation Age of Onset  . CVA Mother   . Lung cancer Mother   . Diabetes Mother   . Heart attack Father   . Hypertension Father   . Lung cancer Father   . Asthma Father   . Breast cancer Neg Hx    Family Psychiatric  History: None reported Social History:  Social History   Substance and Sexual Activity  Alcohol Use No     Social History   Substance and Sexual Activity  Drug Use No    Social History   Socioeconomic History  . Marital status: Single    Spouse  name: Not on file  . Number of children: Not on file  . Years of education: Not on file  . Highest education level: Not on file  Occupational History  . Not on file  Tobacco Use  . Smoking status: Current Every Day Smoker    Packs/day: 0.50    Types: Cigarettes  . Smokeless tobacco: Never Used  Vaping Use  . Vaping Use: Never used  Substance and Sexual Activity  . Alcohol use: No  . Drug use: No  . Sexual activity: Not on file  Other Topics Concern  . Not on file  Social History Narrative  . Not on file   Social Determinants of Health   Financial Resource Strain: Not on file  Food Insecurity: Not on file  Transportation Needs: Not on file  Physical Activity: Not on file  Stress: Not on file  Social Connections: Not on file   Additional Social History:    Allergies:   Allergies  Allergen Reactions  . Tramadol Hives  . Tylenol [Acetaminophen] Nausea And Vomiting  . Vicodin [Hydrocodone-Acetaminophen] Hives    Labs:  Results for orders placed or performed during the hospital encounter of 06/15/20 (from the past 48 hour(s))  Glucose, capillary     Status: Abnormal   Collection Time: 06/20/20  8:04 PM  Result Value Ref Range   Glucose-Capillary 104 (H) 70 - 99 mg/dL    Comment: Glucose reference range applies only to samples taken after fasting for at least 8 hours.  Glucose, capillary     Status: Abnormal   Collection Time: 06/21/20 12:39 AM  Result Value Ref Range   Glucose-Capillary 112 (H) 70 - 99 mg/dL    Comment: Glucose reference range applies only to samples taken after fasting for at least 8 hours.  Glucose, capillary     Status: Abnormal   Collection Time: 06/21/20  4:16 AM  Result Value Ref Range   Glucose-Capillary 114 (H) 70 - 99 mg/dL    Comment: Glucose reference range applies only to samples taken after fasting for at least 8 hours.  Basic metabolic panel     Status: Abnormal   Collection Time: 06/21/20  4:30 AM  Result Value Ref Range   Sodium  139 135 - 145 mmol/L   Potassium 4.3 3.5 - 5.1 mmol/L   Chloride 101 98 - 111 mmol/L   CO2 30 22 - 32 mmol/L   Glucose, Bld 114 (H) 70 - 99 mg/dL    Comment: Glucose reference range applies only to samples taken after fasting for at least 8 hours.   BUN 8 8 - 23 mg/dL   Creatinine, Ser 7.820.64 0.44 - 1.00 mg/dL   Calcium 9.3 8.9 - 95.610.3 mg/dL   GFR, Estimated >21>60 >30>60 mL/min    Comment: (NOTE) Calculated using the CKD-EPI Creatinine Equation (2021)  Anion gap 8 5 - 15    Comment: Performed at [facility], [address] [place], [place] [address]  CBC with Differential/Platelet     Status: Abnormal   Collection Time: 06/21/20  4:30 AM  Result Value Ref Range   WBC 5.6 4.0 - 10.5 K/uL   RBC 3.64 (L) 3.87 - 5.11 MIL/uL   Hemoglobin 11.7 (L) 12.0 - 15.0 g/dL   HCT 20.9 (L) 47.0 - 96.2 %   MCV 96.4 80.0 - 100.0 fL   MCH 32.1 26.0 - 34.0 pg   MCHC 33.3 30.0 - 36.0 g/dL   RDW 83.6 62.9 - 47.6 %   Platelets 173 150 - 400 K/uL   nRBC 0.0 0.0 - 0.2 %   Neutrophils Relative % 61 %   Neutro Abs 3.4 1.7 - 7.7 K/uL   Lymphocytes Relative 26 %   Lymphs Abs 1.4 0.7 - 4.0 K/uL   Monocytes Relative 9 %   Monocytes Absolute 0.5 0.1 - 1.0 K/uL   Eosinophils Relative 3 %   Eosinophils Absolute 0.2 0.0 - 0.5 K/uL   Basophils Relative 1 %   Basophils Absolute 0.0 0.0 - 0.1 K/uL   Immature Granulocytes 0 %   Abs Immature Granulocytes 0.01 0.00 - 0.07 K/uL    Comment: Performed at [facility], [address] [place], [place] [address]  Magnesium     Status: None   Collection Time: 06/21/20  4:30 AM  Result Value Ref Range   Magnesium 1.7 1.7 - 2.4 mg/dL    Comment: Performed at [facility], [address] [place], [place] [address]  Hepatic function panel     Status: Abnormal   Collection Time: 06/21/20  4:30 AM  Result Value Ref Range   Total Protein 6.4 (L) 6.5 - 8.1 g/dL   Albumin 3.1 (L) 3.5 - 5.0 g/dL   AST 25 15 - 41 U/L   ALT 34 0 - 44 U/L    Alkaline Phosphatase 97 38 - 126 U/L   Total Bilirubin 0.3 0.3 - 1.2 mg/dL   Bilirubin, Direct <6.8 0.0 - 0.2 mg/dL   Indirect Bilirubin NOT CALCULATED 0.3 - 0.9 mg/dL    Comment: Performed at [facility], [address] [place], [place] [address]  Glucose, capillary     Status: Abnormal   Collection Time: 06/21/20  8:08 AM  Result Value Ref Range   Glucose-Capillary 103 (H) 70 - 99 mg/dL    Comment: Glucose reference range applies only to samples taken after fasting for at least 8 hours.  Glucose, capillary     Status: Abnormal   Collection Time: 06/21/20 12:06 PM  Result Value Ref Range   Glucose-Capillary 117 (H) 70 - 99 mg/dL    Comment: Glucose reference range applies only to samples taken after fasting for at least 8 hours.  Glucose, capillary     Status: Abnormal   Collection Time: 06/21/20  3:36 PM  Result Value Ref Range   Glucose-Capillary 143 (H) 70 - 99 mg/dL    Comment: Glucose reference range applies only to samples taken after fasting for at least 8 hours.  Glucose, capillary     Status: Abnormal   Collection Time: 06/21/20  8:57 PM  Result Value Ref Range   Glucose-Capillary 119 (H) 70 - 99 mg/dL    Comment: Glucose reference range applies only to samples taken after fasting for at least 8 hours.  Glucose, capillary     Status:  Abnormal   Collection Time: 06/21/20 11:56 PM  Result Value Ref Range   Glucose-Capillary 115 (H) 70 - 99 mg/dL    Comment: Glucose reference range applies only to samples taken after fasting for at least 8 hours.  Glucose, capillary     Status: Abnormal   Collection Time: 06/22/20  3:39 AM  Result Value Ref Range   Glucose-Capillary 103 (H) 70 - 99 mg/dL    Comment: Glucose reference range applies only to samples taken after fasting for at least 8 hours.  Basic metabolic panel     Status: Abnormal   Collection Time: 06/22/20  4:04 AM  Result Value Ref Range   Sodium 140 135 - 145 mmol/L   Potassium 4.1 3.5 - 5.1 mmol/L    Chloride 103 98 - 111 mmol/L   CO2 30 22 - 32 mmol/L   Glucose, Bld 98 70 - 99 mg/dL    Comment: Glucose reference range applies only to samples taken after fasting for at least 8 hours.   BUN 7 (L) 8 - 23 mg/dL   Creatinine, Ser 1.61 0.44 - 1.00 mg/dL   Calcium 9.2 8.9 - 09.6 mg/dL   GFR, Estimated >04 >54 mL/min    Comment: (NOTE) Calculated using the CKD-EPI Creatinine Equation (2021)    Anion gap 7 5 - 15    Comment: Performed at [facility], [address] [place], [place] [address]  CBC with Differential/Platelet     Status: Abnormal   Collection Time: 06/22/20  4:04 AM  Result Value Ref Range   WBC 5.6 4.0 - 10.5 K/uL   RBC 3.54 (L) 3.87 - 5.11 MIL/uL   Hemoglobin 11.3 (L) 12.0 - 15.0 g/dL   HCT 91.4 (L) 78.2 - 95.6 %   MCV 96.3 80.0 - 100.0 fL   MCH 31.9 26.0 - 34.0 pg   MCHC 33.1 30.0 - 36.0 g/dL   RDW 21.3 08.6 - 57.8 %   Platelets 189 150 - 400 K/uL   nRBC 0.0 0.0 - 0.2 %   Neutrophils Relative % 61 %   Neutro Abs 3.4 1.7 - 7.7 K/uL   Lymphocytes Relative 27 %   Lymphs Abs 1.5 0.7 - 4.0 K/uL   Monocytes Relative 8 %   Monocytes Absolute 0.5 0.1 - 1.0 K/uL   Eosinophils Relative 3 %   Eosinophils Absolute 0.2 0.0 - 0.5 K/uL   Basophils Relative 1 %   Basophils Absolute 0.0 0.0 - 0.1 K/uL   Immature Granulocytes 0 %   Abs Immature Granulocytes 0.01 0.00 - 0.07 K/uL    Comment: Performed at [facility], [address] [place], [place] [address]  Magnesium     Status: None   Collection Time: 06/22/20  4:04 AM  Result Value Ref Range   Magnesium 1.8 1.7 - 2.4 mg/dL    Comment: Performed at [facility], [address] [place], [place] [address]  Glucose, capillary     Status: None   Collection Time: 06/22/20  7:29 AM  Result Value Ref Range   Glucose-Capillary 93 70 - 99 mg/dL    Comment: Glucose reference range applies only to samples taken after fasting for at least 8 hours.  Glucose, capillary     Status: Abnormal    Collection Time: 06/22/20 11:32 AM  Result Value Ref Range   Glucose-Capillary 130 (H) 70 - 99 mg/dL    Comment: Glucose reference range applies only to samples taken after fasting for at  least 8 hours.  Glucose, capillary     Status: Abnormal   Collection Time: 06/22/20  3:46 PM  Result Value Ref Range   Glucose-Capillary 134 (H) 70 - 99 mg/dL    Comment: Glucose reference range applies only to samples taken after fasting for at least 8 hours.    Current Facility-Administered Medications  Medication Dose Route Frequency Provider Last Rate Last Admin  . 0.9 %  sodium chloride infusion   Intravenous Continuous Meredeth Ide, MD 75 mL/hr at 06/22/20 1642 Infusion Verify at 06/22/20 1642  . acetaminophen (TYLENOL) tablet 650 mg  650 mg Oral Q6H PRN Meredeth Ide, MD   650 mg at 06/18/20 2328  . ALPRAZolam (XANAX) tablet 1 mg  1 mg Oral TID Bow Buntyn T, MD      . amphetamine-dextroamphetamine (ADDERALL) tablet 10 mg  10 mg Oral Q breakfast Regalado, Belkys A, MD   10 mg at 06/22/20 0818  . aspirin EC tablet 81 mg  81 mg Oral Daily Meredeth Ide, MD   81 mg at 06/22/20 0818  . buprenorphine (SUBUTEX) sublingual tablet 8 mg  8 mg Sublingual TID Meredeth Ide, MD   8 mg at 06/22/20 0818  . Chlorhexidine Gluconate Cloth 2 % PADS 6 each  6 each Topical Daily Meredeth Ide, MD   6 each at 06/21/20 0850  . cholecalciferol (VITAMIN D3) tablet 2,000 Units  2,000 Units Oral Daily Meredeth Ide, MD   2,000 Units at 06/22/20 0818  . enoxaparin (LOVENOX) injection 40 mg  40 mg Subcutaneous Q24H Meredeth Ide, MD   40 mg at 06/21/20 2156  . fluticasone (FLONASE) 50 MCG/ACT nasal spray 1 spray  1 spray Each Nare QHS Meredeth Ide, MD   1 spray at 06/21/20 2150  . guaiFENesin-dextromethorphan (ROBITUSSIN DM) 100-10 MG/5ML syrup 5 mL  5 mL Oral Q4H PRN Meredeth Ide, MD   5 mL at 06/20/20 0001  . insulin aspart (novoLOG) injection 0-9 Units  0-9 Units Subcutaneous TID WC Meredeth Ide, MD   1 Units at  06/21/20 1713  . isosorbide mononitrate (IMDUR) 24 hr tablet 30 mg  30 mg Oral Daily Meredeth Ide, MD   30 mg at 06/22/20 0818  . levETIRAcetam (KEPPRA) tablet 500 mg  500 mg Oral BID Regalado, Belkys A, MD   500 mg at 06/22/20 0819  . LORazepam (ATIVAN) injection 2 mg  2 mg Intravenous Q4H PRN Meredeth Ide, MD      . magnesium oxide (MAG-OX) tablet 400 mg  400 mg Oral BID Regalado, Belkys A, MD   400 mg at 06/22/20 0818  . metoprolol succinate (TOPROL-XL) 24 hr tablet 50 mg  50 mg Oral Daily Regalado, Belkys A, MD   50 mg at 06/22/20 0818  . mometasone-formoterol (DULERA) 200-5 MCG/ACT inhaler 2 puff  2 puff Inhalation BID Meredeth Ide, MD   2 puff at 06/22/20 [phone]  . nicotine (NICODERM CQ - dosed in mg/24 hours) patch 21 mg  21 mg Transdermal Daily Meredeth Ide, MD   21 mg at 06/22/20 9604  . ondansetron (ZOFRAN) tablet 4 mg  4 mg Oral Q6H PRN Meredeth Ide, MD       Or  . ondansetron (ZOFRAN) injection 4 mg  4 mg Intravenous Q6H PRN Meredeth Ide, MD      . pantoprazole (PROTONIX) EC tablet 40 mg  40 mg Oral Daily Meredeth Ide, MD  40 mg at 06/22/20 0818  . polyethylene glycol (MIRALAX / GLYCOLAX) packet 17 g  17 g Oral Daily Lewie Chamber, MD   17 g at 06/22/20 0817  . senna-docusate (Senokot-S) tablet 1 tablet  1 tablet Oral BID Lewie Chamber, MD   1 tablet at 06/22/20 0818  . vitamin B-12 (CYANOCOBALAMIN) tablet 2,500 mcg  2,500 mcg Oral Daily Meredeth Ide, MD   2,500 mcg at 06/22/20 0818  . vitamin E capsule 400 Units  400 Units Oral Daily Meredeth Ide, MD   400 Units at 06/22/20 7564    Musculoskeletal: Strength & Muscle Tone: within normal limits Gait & Station: normal Patient leans: N/A            Psychiatric Specialty Exam:  Presentation  General Appearance: No data recorded Eye Contact:No data recorded Speech:No data recorded Speech Volume:No data recorded Handedness:No data recorded  Mood and Affect  Mood:No data recorded Affect:No data  recorded  Thought Process  Thought Processes:No data recorded Descriptions of Associations:No data recorded Orientation:No data recorded Thought Content:No data recorded History of Schizophrenia/Schizoaffective disorder:No data recorded Duration of Psychotic Symptoms:No data recorded Hallucinations:No data recorded Ideas of Reference:No data recorded Suicidal Thoughts:No data recorded Homicidal Thoughts:No data recorded  Sensorium  Memory:No data recorded Judgment:No data recorded Insight:No data recorded  Executive Functions  Concentration:No data recorded Attention Span:No data recorded Recall:No data recorded Fund of Knowledge:No data recorded Language:No data recorded  Psychomotor Activity  Psychomotor Activity:No data recorded  Assets  Assets:No data recorded  Sleep  Sleep:No data recorded  Physical Exam: Physical Exam Vitals and nursing note reviewed.  Constitutional:      Appearance: Normal appearance.  HENT:     Head: Normocephalic and atraumatic.     Mouth/Throat:     Pharynx: Oropharynx is clear.  Eyes:     Pupils: Pupils are equal, round, and reactive to light.  Cardiovascular:     Rate and Rhythm: Normal rate and regular rhythm.  Pulmonary:     Effort: Pulmonary effort is normal.     Breath sounds: Normal breath sounds.  Abdominal:     General: Abdomen is flat.     Palpations: Abdomen is soft.  Musculoskeletal:        General: Normal range of motion.  Skin:    General: Skin is warm and dry.  Neurological:     General: No focal deficit present.     Mental Status: She is alert. Mental status is at baseline.  Psychiatric:        Attention and Perception: Attention normal.        Mood and Affect: Mood normal.        Speech: Speech normal.        Behavior: Behavior normal.        Thought Content: Thought content normal.        Cognition and Memory: Memory is impaired.    Review of Systems  Constitutional: Negative.   HENT: Negative.    Eyes: Negative.   Respiratory: Negative.   Cardiovascular: Negative.   Gastrointestinal: Negative.   Musculoskeletal: Negative.   Skin: Negative.   Neurological: Negative.   Psychiatric/Behavioral: Negative.    Blood pressure 114/67, pulse 67, temperature 98.6 F (37 C), resp. rate 16, height 5\' 3"  (1.6 m), weight 63 kg, SpO2 97 %. Body mass index is 24.6 kg/m.  Treatment Plan Summary: Plan 65 year old woman on high doses of multiple controlled substances.  2 episodes of altered mental status since April  this 1 with witnessed reports of seizures.  This 1 seems to have been possibly related to withdrawal from alprazolam.  Patient insists that the other 1 could not do been related to that because she had not run out of her alprazolam.  Patient insists that she does not abuse her medication and that she has never felt like any of it caused problems.  Psychoeducation provided regarding the dangers of benzodiazepine use in older people leading to memory problems and increasing the risk of dementia.  Psychoeducation about the risks of amphetamine use including agitation possible psychosis at least possible increased risk of seizures.  Patient states she does not care at all about any of those things.  She does not want to change any of her medicines.  She feels that the medicines in no way are causing any problems whatsoever.  I see that the dose of Xanax had been cut down to 0.53 times a day.  That is a pretty big decrease from her 2 mg.  I went ahead and put her back on 1 mg.  She is also on a much lower dose of Adderall.  None of it seems to be causing her any acute problems right now.  However I think it is probably to no avail to try and make any changes.  Patient is devoted to her outpatient psychiatrist and surely will continue taking what he prescribes for her.  I recommended to her that she discuss with her outpatient psychiatrist these 2 episodes of altered mental status and possible seizures and  talk with him about possibly altering doses of her medicine.  She at least acknowledge that.  I do not think there is any other route for any intervention at this point prior to discharge tomorrow.  Disposition: No evidence of imminent risk to self or others at present.   Patient does not meet criteria for psychiatric inpatient admission. Supportive therapy provided about ongoing stressors.  Mordecai Rasmussen, MD 06/22/2020 6:42 PM

## 2020-06-22 NOTE — Progress Notes (Signed)
Physical Therapy Treatment Patient Details Name: Angela Daniel MRN: [id number] DOB: May 18, 1955 Today's Date: 06/22/2020    History of Present Illness 65 year old female with history of hypertension, diabetes mellitus type 2, COPD, nicotine dependence, OSA, GERD, anxiety, depression was brought to the ER by EMS for evaluation of possible seizure episode and mental status change.    PT Comments    Cues to awaken and engage in session.  Gait in room with and without AD.  Several small LOB"s without AD.  Encouraged to use RW upon discharge especially outside and in community.  Recommend +1 assist for safety upon discharge.  Voiced understanding and reviewed with RN.    Follow Up Recommendations  Home health PT;Supervision for mobility/OOB     Equipment Recommendations  Rolling walker with 5" wheels    Recommendations for Other Services       Precautions / Restrictions Precautions Precautions: Fall Restrictions Weight Bearing Restrictions: No    Mobility  Bed Mobility               General bed mobility comments: In recliner at beginning/end of session    Transfers Overall transfer level: Needs assistance Equipment used: Rolling walker (2 wheeled);None Transfers: Sit to/from Stand Sit to Stand: Min guard            Ambulation/Gait Ambulation/Gait assistance: Min assist Gait Distance (Feet): 100 Feet Assistive device: Rolling walker (2 wheeled);None Gait Pattern/deviations: Step-through pattern;Narrow base of support Gait velocity: decreased   General Gait Details: 21' with RW but pt stated she felt good and did not need it.  84' with no AD.  Several small LOB's which she was able to self recover or hold onto objects in room to rebalance.  encouraged RW use at home especially when alone or outside   Stairs             Wheelchair Mobility    Modified Rankin (Stroke Patients Only)       Balance Overall balance assessment: Needs  assistance Sitting-balance support: No upper extremity supported;Feet supported Sitting balance-Leahy Scale: Good Sitting balance - Comments: Good sitting balance reaching within BOS while seated in recliner   Standing balance support: No upper extremity supported Standing balance-Leahy Scale: Fair                              Cognition Arousal/Alertness: Youth worker During Therapy: WFL for tasks assessed/performed Overall Cognitive Status: Within Functional Limits for tasks assessed                                 General Comments: seemed more interested in ceel phone than safety instructions      Exercises      General Comments        Pertinent Vitals/Pain Pain Assessment: No/denies pain    Home Living                      Prior Function            PT Goals (current goals can now be found in the care plan section) Progress towards PT goals: Progressing toward goals    Frequency    Min 2X/week      PT Plan      Co-evaluation              AM-PAC PT "6 Clicks" Mobility  Outcome Measure  Help needed turning from your back to your side while in a flat bed without using bedrails?: A Little Help needed moving from lying on your back to sitting on the side of a flat bed without using bedrails?: A Little Help needed moving to and from a bed to a chair (including a wheelchair)?: A Little Help needed standing up from a chair using your arms (e.g., wheelchair or bedside chair)?: A Little Help needed to walk in hospital room?: A Little Help needed climbing 3-5 steps with a railing? : A Lot 6 Click Score: 17    End of Session Equipment Utilized During Treatment: Gait belt;Oxygen Activity Tolerance: Patient limited by fatigue Patient left: with chair alarm set;with call bell/phone within reach Nurse Communication: Mobility status PT Visit Diagnosis: Muscle weakness (generalized) (M62.81);Difficulty in walking, not  elsewhere classified (R26.2)     Time: 2423-5361 PT Time Calculation (min) (ACUTE ONLY): 15 min  Charges:  $Gait Training: 8-22 mins                   Allysia Ingles, PTA 06/22/20, 10:56 AM

## 2020-06-22 NOTE — Progress Notes (Signed)
PROGRESS NOTE    Angela Daniel  MOQ:[id number] DOB: 11-Jun-1955 DOA: 06/15/2020 PCP: [facility], NP   Brief Narrative: 65 year old with past medical history significant for hypertension, diabetes type 2, COPD, nicotine dependence, OSA, GERD, anxiety, depression who was brought by ER by EMS for evaluation of possible seizure episode of mental status changes.  Patient was noted to have tonic clonic seizure episode the day of admission.  Patient is on chronic Xanax.  UDS was negative for Xanax.  Patient reported that she might have forgotten to take Xanax. Patient Xanax was restarted decreased dose and plan to taper as an outpatient.   Assessment & Plan:   Principal Problem:   AMS (altered mental status) Active Problems:   Hypertension   GERD (gastroesophageal reflux disease)   Diabetes mellitus without complication (HCC)   Depression with anxiety   Nicotine dependence   Seizure (HCC)   Lactic acidosis   1-Acute toxic metabolic encephalopathy: Post ictal state from seizure versus polypharmacy Patient was noted to have witnessed seizure at home. Patient was a started on IV Keppra by neurology. Patient on multiple psychotropic medication: Xanax, gabapentin and Abilify 5 buprenorphine and Adderall Gabapentin and Adderall have been discontinued. Xanax dose reduced to 0.5 every 6 hours, plan to taper fever off as an outpatient Stable on low dose xanax.  Psych consulted to help adjust psychiatric medications.   Seizure: Presume related to benzodiazepine withdrawal Continue with Keppra.  Diabetes type 2: Metformin on hold Continue with sliding scale insulin  Hypomagnesemia: Replete  Lactic acidosis: From seizure, resolved  Hypertension: Change IV metoprolol to oral  Opioid dependence: Continue with buprenorphine 8 mg 3 times daily Transaminases;  Ultrasound unremarkable.  Resolved     Estimated body mass index is 24.6 kg/m as calculated from the  following:   Height as of this encounter: 5\' 3"  (1.6 m).   Weight as of this encounter: 63 kg.   DVT prophylaxis: Lovenox Code Status: Full code Family Communication: daughter over phone Disposition Plan:  Status is: Inpatient  Remains inpatient appropriate because:IV treatments appropriate due to intensity of illness or inability to take PO   Dispo: The patient is from: Home              Anticipated d/c is to: Home              Patient currently is not medically stable to d/c.   Difficult to place patient No        Consultants:   Neurology   Procedures:   None  Antimicrobials:    Subjective: She was more alert today on my evaluation.  She denies pain.   Objective: Vitals:   06/22/20 0337 06/22/20 0733 06/22/20 1132 06/22/20 1546  BP: (!) 147/83 (!) 155/78 100/67 114/67  Pulse: 68 68 64 67  Resp: 18 17 17 16   Temp: (!) 97.5 F (36.4 C) 97.6 F (36.4 C) (!) 97.5 F (36.4 C) 98.6 F (37 C)  TempSrc: Oral Oral    SpO2: 100% 99% 100% 97%  Weight:      Height:        Intake/Output Summary (Last 24 hours) at 06/22/2020 1612 Last data filed at 06/22/2020 0440 Gross per 24 hour  Intake 0 ml  Output 1550 ml  Net -1550 ml   Filed Weights   06/15/20 0739  Weight: 63 kg    Examination:  General exam: NAD Respiratory system: CTA Cardiovascular system: S 1, S  2 RRR Gastrointestinal  system: Bs present. NT Central nervous system: alert Extremities: symmetric power.    Data Reviewed: I have personally reviewed following labs and imaging studies  CBC: Recent Labs  Lab 06/16/20 0535 06/17/20 0423 06/18/20 0448 06/21/20 0430 06/22/20 0404  WBC 6.1 13.7* 6.4 5.6 5.6  NEUTROABS  --   --   --  3.4 3.4  HGB 12.3 13.3 12.8 11.7* 11.3*  HCT 36.6 39.0 38.2 35.1* 34.1*  MCV 94.6 92.2 94.8 96.4 96.3  PLT 191 153 149* 173 189   Basic Metabolic Panel: Recent Labs  Lab 06/16/20 0535 06/17/20 0423 06/18/20 0448 06/19/20 1302 06/21/20 0430  06/22/20 0404  NA 141 140 137  --  139 140  K 3.7 4.9 4.5  --  4.3 4.1  CL 104 103 101  --  101 103  CO2 29 27 27   --  30 30  GLUCOSE 104* 87 94  --  114* 98  BUN 15 14 13   --  8 7*  CREATININE 0.66 0.66 0.56  --  0.64 0.50  CALCIUM 8.4* 9.0 8.9  --  9.3 9.2  MG  --   --  1.5* 1.9 1.7 1.8   GFR: Estimated Creatinine Clearance: 61.8 mL/min (by C-G formula based on SCr of 0.5 mg/dL). Liver Function Tests: Recent Labs  Lab 06/18/20 0448 06/20/20 0402 06/21/20 0430  AST 244* 85* 25  ALT 82* 53* 34  ALKPHOS 99 127* 97  BILITOT 0.9 0.7 0.3  PROT 6.6 6.4* 6.4*  ALBUMIN 3.3* 3.1* 3.1*   No results for input(s): LIPASE, AMYLASE in the last 168 hours. No results for input(s): AMMONIA in the last 168 hours. Coagulation Profile: No results for input(s): INR, PROTIME in the last 168 hours. Cardiac Enzymes: No results for input(s): CKTOTAL, CKMB, CKMBINDEX, TROPONINI in the last 168 hours. BNP (last 3 results) No results for input(s): PROBNP in the last 8760 hours. HbA1C: No results for input(s): HGBA1C in the last 72 hours. CBG: Recent Labs  Lab 06/21/20 2356 06/22/20 0339 06/22/20 0729 06/22/20 1132 06/22/20 1546  GLUCAP 115* 103* 93 130* 134*   Lipid Profile: No results for input(s): CHOL, HDL, LDLCALC, TRIG, CHOLHDL, LDLDIRECT in the last 72 hours. Thyroid Function Tests: No results for input(s): TSH, T4TOTAL, FREET4, T3FREE, THYROIDAB in the last 72 hours. Anemia Panel: No results for input(s): VITAMINB12, FOLATE, FERRITIN, TIBC, IRON, RETICCTPCT in the last 72 hours. Sepsis Labs: No results for input(s): PROCALCITON, LATICACIDVEN in the last 168 hours.  Recent Results (from the past 240 hour(s))  Resp Panel by RT-PCR (Flu A&B, Covid)     Status: None   Collection Time: 06/15/20  9:14 AM   Specimen: Nasopharyngeal(NP) swabs in vial transport medium  Result Value Ref Range Status   SARS Coronavirus 2 by RT PCR NEGATIVE NEGATIVE Final    Comment: (NOTE) SARS-CoV-2  target nucleic acids are NOT DETECTED.  The SARS-CoV-2 RNA is generally detectable in upper respiratory specimens during the acute phase of infection. The lowest concentration of SARS-CoV-2 viral copies this assay can detect is 138 copies/mL. A negative result does not preclude SARS-Cov-2 infection and should not be used as the sole basis for treatment or other patient management decisions. A negative result may occur with  improper specimen collection/handling, submission of specimen other than nasopharyngeal swab, presence of viral mutation(s) within the areas targeted by this assay, and inadequate number of viral copies(<138 copies/mL). A negative result must be combined with clinical observations, patient history, and epidemiological information.  The expected result is Negative.  Fact Sheet for Patients:  BloggerCourse.com  Fact Sheet for Healthcare Providers:  SeriousBroker.it  This test is no t yet approved or cleared by the [place] FDA and  has been authorized for detection and/or diagnosis of SARS-CoV-2 by FDA under an Emergency Use Authorization (EUA). This EUA will remain  in effect (meaning this test can be used) for the duration of the COVID-19 declaration under Section 564(b)(1) of the Act, 21 U.S.C.section 360bbb-3(b)(1), unless the authorization is terminated  or revoked sooner.       Influenza A by PCR NEGATIVE NEGATIVE Final   Influenza B by PCR NEGATIVE NEGATIVE Final    Comment: (NOTE) The Xpert Xpress SARS-CoV-2/FLU/RSV plus assay is intended as an aid in the diagnosis of influenza from Nasopharyngeal swab specimens and should not be used as a sole basis for treatment. Nasal washings and aspirates are unacceptable for Xpert Xpress SARS-CoV-2/FLU/RSV testing.  Fact Sheet for Patients: BloggerCourse.com  Fact Sheet for Healthcare  Providers: SeriousBroker.it  This test is not yet approved or cleared by the [place] FDA and has been authorized for detection and/or diagnosis of SARS-CoV-2 by FDA under an Emergency Use Authorization (EUA). This EUA will remain in effect (meaning this test can be used) for the duration of the COVID-19 declaration under Section 564(b)(1) of the Act, 21 U.S.C. section 360bbb-3(b)(1), unless the authorization is terminated or revoked.  Performed at [facility], [address], [place], [place] [address]          Radiology Studies: No results found.      Scheduled Meds: . ALPRAZolam  0.5 mg Oral TID  . amphetamine-dextroamphetamine  10 mg Oral Q breakfast  . aspirin EC  81 mg Oral Daily  . buprenorphine  8 mg Sublingual TID  . Chlorhexidine Gluconate Cloth  6 each Topical Daily  . cholecalciferol  2,000 Units Oral Daily  . enoxaparin (LOVENOX) injection  40 mg Subcutaneous Q24H  . fluticasone  1 spray Each Nare QHS  . insulin aspart  0-9 Units Subcutaneous TID WC  . isosorbide mononitrate  30 mg Oral Daily  . levETIRAcetam  500 mg Oral BID  . magnesium oxide  400 mg Oral BID  . metoprolol succinate  50 mg Oral Daily  . mometasone-formoterol  2 puff Inhalation BID  . nicotine  21 mg Transdermal Daily  . pantoprazole  40 mg Oral Daily  . polyethylene glycol  17 g Oral Daily  . senna-docusate  1 tablet Oral BID  . vitamin B-12  2,500 mcg Oral Daily  . vitamin E  400 Units Oral Daily   Continuous Infusions: . sodium chloride 75 mL/hr at 06/22/20 1230     LOS: 5 days    Time spent: 35 minutes    Cheron Pasquarelli A Tniya Bowditch, MD [facility]   If 7PM-7AM, please contact night-coverage [email or web address]  06/22/2020, 4:12 PM

## 2020-06-22 NOTE — Progress Notes (Signed)
Per Dr Regalado, ok to dc tele 

## 2020-06-23 DIAGNOSIS — I10 Essential (primary) hypertension: Secondary | ICD-10-CM | POA: Diagnosis not present

## 2020-06-23 LAB — GLUCOSE, CAPILLARY
Glucose-Capillary: 104 mg/dL — ABNORMAL HIGH (ref 70–99)
Glucose-Capillary: 112 mg/dL — ABNORMAL HIGH (ref 70–99)
Glucose-Capillary: 137 mg/dL — ABNORMAL HIGH (ref 70–99)

## 2020-06-23 LAB — CBC WITH DIFFERENTIAL/PLATELET
Abs Immature Granulocytes: 0.01 10*3/uL (ref 0.00–0.07)
Basophils Absolute: 0 10*3/uL (ref 0.0–0.1)
Basophils Relative: 1 %
Eosinophils Absolute: 0.1 10*3/uL (ref 0.0–0.5)
Eosinophils Relative: 2 %
HCT: 32.1 % — ABNORMAL LOW (ref 36.0–46.0)
Hemoglobin: 10.8 g/dL — ABNORMAL LOW (ref 12.0–15.0)
Immature Granulocytes: 0 %
Lymphocytes Relative: 26 %
Lymphs Abs: 1.2 10*3/uL (ref 0.7–4.0)
MCH: 31.7 pg (ref 26.0–34.0)
MCHC: 33.6 g/dL (ref 30.0–36.0)
MCV: 94.1 fL (ref 80.0–100.0)
Monocytes Absolute: 0.4 10*3/uL (ref 0.1–1.0)
Monocytes Relative: 8 %
Neutro Abs: 2.9 10*3/uL (ref 1.7–7.7)
Neutrophils Relative %: 63 %
Platelets: 191 10*3/uL (ref 150–400)
RBC: 3.41 MIL/uL — ABNORMAL LOW (ref 3.87–5.11)
RDW: 12.3 % (ref 11.5–15.5)
WBC: 4.6 10*3/uL (ref 4.0–10.5)
nRBC: 0 % (ref 0.0–0.2)

## 2020-06-23 LAB — MAGNESIUM: Magnesium: 1.9 mg/dL (ref 1.7–2.4)

## 2020-06-23 LAB — BASIC METABOLIC PANEL
Anion gap: 7 (ref 5–15)
BUN: 8 mg/dL (ref 8–23)
CO2: 30 mmol/L (ref 22–32)
Calcium: 9 mg/dL (ref 8.9–10.3)
Chloride: 104 mmol/L (ref 98–111)
Creatinine, Ser: 0.51 mg/dL (ref 0.44–1.00)
GFR, Estimated: >60 mL/min (ref >60)
Glucose, Bld: 120 mg/dL — ABNORMAL HIGH (ref 70–99)
Potassium: 3.8 mmol/L (ref 3.5–5.1)
Sodium: 141 mmol/L (ref 135–145)

## 2020-06-23 MED ORDER — BLOOD GLUCOSE MONITOR KIT: PACK | 0 refills | Status: DC

## 2020-06-23 MED ORDER — SENNOSIDES-DOCUSATE SODIUM 8.6-50 MG PO TABS: 1.0000 | ORAL_TABLET | Freq: Two times a day (BID) | ORAL | 0 refills | Status: DC

## 2020-06-23 MED ORDER — LEVETIRACETAM 500 MG PO TABS: 500.0000 mg | ORAL_TABLET | Freq: Two times a day (BID) | ORAL | 3 refills | Status: DC

## 2020-06-23 MED ORDER — MAGNESIUM OXIDE -MG SUPPLEMENT 400 (240 MG) MG PO TABS: 400.0000 mg | ORAL_TABLET | Freq: Two times a day (BID) | ORAL | 0 refills | Status: DC

## 2020-06-23 MED ORDER — POLYETHYLENE GLYCOL 3350 17 G PO PACK: 17.0000 g | PACK | Freq: Every day | ORAL | 0 refills | Status: DC

## 2020-06-23 MED ORDER — AMPHETAMINE-DEXTROAMPHETAMINE 10 MG PO TABS: 10.0000 mg | ORAL_TABLET | Freq: Every day | ORAL | 0 refills | Status: DC

## 2020-06-23 MED ORDER — ALPRAZOLAM 1 MG PO TABS: 1.0000 mg | ORAL_TABLET | Freq: Three times a day (TID) | ORAL | 0 refills | Status: DC

## 2020-06-23 NOTE — Progress Notes (Signed)
Physical Therapy Treatment Patient Details Name: Angela Daniel MRN: [id number] DOB: 09-Apr-1955 Today's Date: 06/23/2020    History of Present Illness 65 year old female with history of hypertension, diabetes mellitus type 2, COPD, nicotine dependence, OSA, GERD, anxiety, depression was brought to the ER by EMS for evaluation of possible seizure episode and mental status change.    PT Comments    Awake and more engaged today in session.  Stood and walks to bathroom to void and attend to her own care then continues x 1 lap around unit.  She is encouraged not to push IV pole for balance but does reach out for rails when available.  She admits to feeling unsteady at times but is not interested in using a walker.  Stated she has one at home and she is encouraged to use it especially outside and in the community.  Voiced understanding.   Follow Up Recommendations  Home health PT;Supervision for mobility/OOB     Equipment Recommendations  Rolling walker with 5" wheels    Recommendations for Other Services       Precautions / Restrictions Precautions Precautions: Fall Restrictions Weight Bearing Restrictions: No    Mobility  Bed Mobility               General bed mobility comments: In recliner at beginning/end of session    Transfers Overall transfer level: Needs assistance Equipment used: None Transfers: Sit to/from Stand Sit to Stand: Min guard            Ambulation/Gait Ambulation/Gait assistance: Min guard;Min assist Gait Distance (Feet): 200 Feet Assistive device: None Gait Pattern/deviations: Step-through pattern;Decreased step length - right;Decreased step length - left;Trunk flexed Gait velocity: decreased   General Gait Details: complete lap, unsetady at times, reaches for rails when available but stated she will not use RW at home   Stairs             Wheelchair Mobility    Modified Rankin (Stroke Patients Only)       Balance Overall  balance assessment: Needs assistance Sitting-balance support: No upper extremity supported;Feet supported Sitting balance-Leahy Scale: Good     Standing balance support: No upper extremity supported Standing balance-Leahy Scale: Fair                              Cognition Arousal/Alertness: Awake/alert Behavior During Therapy: WFL for tasks assessed/performed Overall Cognitive Status: Within Functional Limits for tasks assessed                                        Exercises      General Comments        Pertinent Vitals/Pain Pain Assessment: No/denies pain    Home Living                      Prior Function            PT Goals (current goals can now be found in the care plan section) Progress towards PT goals: Progressing toward goals    Frequency    Min 2X/week      PT Plan      Co-evaluation              AM-PAC PT "6 Clicks" Mobility   Outcome Measure  Help needed turning from your back to your side  while in a flat bed without using bedrails?: A Little Help needed moving from lying on your back to sitting on the side of a flat bed without using bedrails?: A Little Help needed moving to and from a bed to a chair (including a wheelchair)?: A Little Help needed standing up from a chair using your arms (e.g., wheelchair or bedside chair)?: A Little Help needed to walk in hospital room?: A Little Help needed climbing 3-5 steps with a railing? : A Lot 6 Click Score: 17    End of Session Equipment Utilized During Treatment: Gait belt;Oxygen Activity Tolerance: Patient limited by fatigue Patient left: with chair alarm set;with call bell/phone within reach Nurse Communication: Mobility status PT Visit Diagnosis: Muscle weakness (generalized) (M62.81);Difficulty in walking, not elsewhere classified (R26.2)     Time: 0981-1914 PT Time Calculation (min) (ACUTE ONLY): 12 min  Charges:  $Gait Training: 8-22 mins                     Angela Daniel, PTA 06/23/20, 9:49 AM

## 2020-06-23 NOTE — Progress Notes (Signed)
Gave patient discharge instructions and gathered all personal items. She is alert and oriented. She contacted daughter to come pick up. Notified transport.

## 2020-06-23 NOTE — Discharge Summary (Signed)
Physician Discharge Summary  Angela Daniel ELF:[id number] DOB: Sep 08, 1955 DOA: 06/15/2020  PCP: [facility], [place], NP  Admit date: 06/15/2020 Discharge date: 06/23/2020  Admitted From; Home  Disposition:  Home   Recommendations for Outpatient Follow-up:  1. Follow up with PCP in 1-2 weeks 2. Please obtain BMP/CBC in one week 3. Needs further titration off Xanax by psychiatrist.   Home Health: Yes.   Discharge Condition: Stable.  CODE STATUS: Full Code Diet recommendation: Heart Healthy   Brief/Interim Summary: 65 year old with past medical history significant for hypertension, diabetes type 2, COPD, nicotine dependence, OSA, GERD, anxiety, depression who was brought by ER by EMS for evaluation of possible seizure episode of mental status changes.  Patient was noted to have tonic clonic seizure episode the day of admission.  Patient is on chronic Xanax.  UDS was negative for Xanax.  Patient reported that she might have forgotten to take Xanax. Patient Xanax was restarted decreased dose and plan to taper as an outpatient.   1-Acute toxic metabolic encephalopathy: Post ictal state from seizure versus polypharmacy Patient was noted to have witnessed seizure at home. Patient was a started on IV Keppra by neurology. Discharge on oral Keppra.  Patient on multiple psychotropic medication: Xanax, gabapentin and Abilify 5 buprenorphine and Adderall Gabapentin and Abilify have been discontinued. Psych consulted to help adjust psychiatric medications. plan to continue with current dose of adderall. Increase xanax to 1 mg TID. Needs further taper as out patient.   Seizure: Presume related to benzodiazepine withdrawal Continue with Keppra.  Diabetes type 2: Metformin resume at discharge.  Continue with sliding scale insulin  Hypomagnesemia: Replete  Lactic acidosis: From seizure, resolved  Hypertension: Change IV metoprolol to oral  Opioid dependence: Continue with  buprenorphine 8 mg 3 times daily Transaminases;  Ultrasound unremarkable.  Resolved     Discharge Diagnoses:  Principal Problem:   AMS (altered mental status) Active Problems:   Hypertension   GERD (gastroesophageal reflux disease)   Diabetes mellitus without complication (HCC)   Depression with anxiety   Nicotine dependence   Seizure (HCC)   Lactic acidosis    Discharge Instructions  Discharge Instructions    Diet - low sodium heart healthy   Complete by: As directed    Increase activity slowly   Complete by: As directed      Allergies as of 06/23/2020      Reactions   Tramadol Hives   Tylenol [acetaminophen] Nausea And Vomiting   Vicodin [hydrocodone-acetaminophen] Hives      Medication List    STOP taking these medications   amphetamine-dextroamphetamine 20 MG tablet Commonly known as: ADDERALL   amphetamine-dextroamphetamine 30 MG tablet Commonly known as: ADDERALL Replaced by: amphetamine-dextroamphetamine 10 MG tablet   ARIPiprazole 15 MG tablet Commonly known as: ABILIFY   furosemide 20 MG tablet Commonly known as: LASIX   gabapentin 800 MG tablet Commonly known as: NEURONTIN   levofloxacin 750 MG tablet Commonly known as: LEVAQUIN   tiZANidine 4 MG tablet Commonly known as: ZANAFLEX     TAKE these medications   ALPRAZolam 1 MG tablet Commonly known as: XANAX Take 1 tablet (1 mg total) by mouth 3 (three) times daily. What changed:   medication strength  how much to take   amLODipine 2.5 MG tablet Commonly known as: NORVASC Take 2.5 mg by mouth daily.   amphetamine-dextroamphetamine 10 MG tablet Commonly known as: ADDERALL Take 1 tablet (10 mg total) by mouth daily with breakfast. Start taking on: June 24, 2020 Replaces: amphetamine-dextroamphetamine 30 MG tablet   aspirin EC 81 MG tablet Take 81 mg by mouth daily.   blood glucose meter kit and supplies Kit Dispense based on patient and insurance preference. Use up to four  times daily as directed. Check blood sugar three times a day   buprenorphine 8 MG Subl SL tablet Commonly known as: SUBUTEX Place 8 mg under the tongue 3 (three) times daily.   Cyanocobalamin 2500 MCG Chew Chew 2,500 mcg by mouth daily.   diclofenac 50 MG EC tablet Commonly known as: VOLTAREN Take 50 mg by mouth 2 (two) times daily as needed for mild pain.   esomeprazole 40 MG capsule Commonly known as: NEXIUM Take 40 mg by mouth daily.   fluticasone 50 MCG/ACT nasal spray Commonly known as: FLONASE Place 1 spray into both nostrils at bedtime.   Fluticasone-Salmeterol 250-50 MCG/DOSE Aepb Commonly known as: ADVAIR Inhale 1 puff into the lungs 2 (two) times daily.   isosorbide mononitrate 30 MG 24 hr tablet Commonly known as: IMDUR Take 30 mg by mouth daily.   levETIRAcetam 500 MG tablet Commonly known as: KEPPRA Take 1 tablet (500 mg total) by mouth 2 (two) times daily.   lisinopril 10 MG tablet Commonly known as: ZESTRIL Take 10 mg by mouth daily.   magnesium oxide 400 (240 Mg) MG tablet Commonly known as: MAG-OX Take 1 tablet (400 mg total) by mouth 2 (two) times daily.   metFORMIN 500 MG tablet Commonly known as: GLUCOPHAGE Take 500 mg by mouth 2 (two) times daily with a meal.   metoprolol succinate 50 MG 24 hr tablet Commonly known as: TOPROL-XL Take 50 mg by mouth daily.   Narcan 4 MG/0.1ML Liqd nasal spray kit Generic drug: naloxone Place 1 spray into the nose as directed.   pantoprazole 20 MG tablet Commonly known as: PROTONIX Take 20 mg by mouth daily.   polyethylene glycol 17 g packet Commonly known as: MIRALAX / GLYCOLAX Take 17 g by mouth daily.   rosuvastatin 20 MG tablet Commonly known as: CRESTOR Take 20 mg by mouth daily.   senna-docusate 8.6-50 MG tablet Commonly known as: Senokot-S Take 1 tablet by mouth 2 (two) times daily.   Vitamin D3 50 MCG (2000 UT) Tabs Take 2,000 mg by mouth daily.   Vitamin E 400 units Tabs Take 400  Units by mouth daily.   zinc gluconate 50 MG tablet Take 50 mg by mouth daily.            Durable Medical Equipment  (From admission, onward)         Start     Ordered   06/21/20 1350  For home use only DME Walker rolling  Once       Question Answer Comment  Walker: With 5 Inch Wheels   Patient needs a walker to treat with the following condition Weakness      06/21/20 Th[place], [place], NP Follow up in 1 week(s).   Contact information: [address] [place] [address] [phone]              Allergies  Allergen Reactions  . Tramadol Hives  . Tylenol [Acetaminophen] Nausea And Vomiting  . Vicodin [Hydrocodone-Acetaminophen] Hives    Consultations:  Psych   Neurology    Procedures/Studies: CT Head Wo Contrast  Result Date: 06/15/2020 CLINICAL DATA:  Recent seizure activity, initial encounter  EXAM: CT HEAD WITHOUT CONTRAST TECHNIQUE: Contiguous axial images were obtained from the base of the skull through the vertex without intravenous contrast. 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 No findings to suggest acute hemorrhage, acute infarction or space-occupying mass lesion are noted. Mild atrophic changes and chronic white matter ischemic changes are noted. Vascular: No hyperdense vessel or unexpected calcification. Skull: Normal. Negative for fracture or focal lesion. Sinuses/Orbits: No acute finding. Other: None IMPRESSION: Chronic atrophic and ischemic changes.  No acute abnormality noted. Electronically Signed   By: Inez Catalina M.D.   On: 06/15/2020 08:20   MR BRAIN W WO CONTRAST  Result Date: 06/15/2020 CLINICAL DATA:  Initial evaluation for acute seizure. EXAM: MRI HEAD WITHOUT AND WITH CONTRAST TECHNIQUE: Multiplanar, multiecho pulse sequences of the brain and surrounding structures were obtained without and  with intravenous contrast. CONTRAST:  27m GADAVIST GADOBUTROL 1 MMOL/ML IV SOLN COMPARISON:  Prior CT from earlier the same day. FINDINGS: Brain: Examination mildly degraded by motion artifact. Cerebral volume within normal limits for age. Scattered patchy T2/FLAIR hyperintensity seen within the periventricular, deep, and subcortical white matter both cerebral hemispheres, nonspecific, but most commonly related to chronic microvascular ischemic disease. Overall, appearance is moderate in nature. Few small dilated perivascular spaces noted at the inferior basal ganglia bilaterally. No abnormal foci of restricted diffusion to suggest acute or subacute ischemia or changes related to seizure. Gray-white matter differentiation maintained. No encephalomalacia to suggest chronic cortical infarction. No evidence for acute or chronic intracranial hemorrhage. No mass lesion, midline shift or mass effect. No hydrocephalus or extra-axial fluid collection. Pituitary gland and suprasellar region within normal limits. Midline structures intact. No intrinsic temporal lobe abnormality. No abnormal enhancement. Vascular: Right vertebral artery hypoplastic and not well seen. Major intracranial vascular flow voids are otherwise well maintained. Skull and upper cervical spine: Degenerative thickening noted at the tectorial membrane without significant stenosis. Craniocervical junction otherwise unremarkable. Bone marrow signal intensity within normal limits. Bone marrow signal intensity normal. No scalp soft tissue abnormality. Sinuses/Orbits: Globes and orbital soft tissues demonstrate no acute finding. Paranasal sinuses are clear. Small left mastoid effusion noted, of doubtful significance. Inner ear structures grossly normal. Other: None. IMPRESSION: 1. No acute intracranial abnormality. 2. Moderate T2/FLAIR hyperintensity involving the supratentorial cerebral white matter, nonspecific, but most commonly related to chronic  microvascular ischemic disease. Electronically Signed   By: BJeannine BogaM.D.   On: 06/15/2020 21:38   UKoreaAbdomen Complete  Result Date: 06/19/2020 CLINICAL DATA:  Transaminitis.  History of a cholecystectomy. EXAM: ABDOMEN ULTRASOUND COMPLETE COMPARISON:  CT, 07/28/2019. FINDINGS: Gallbladder: Surgically absent. Common bile duct: Diameter: 9 mm. Liver: . Normal overall parenchymal echogenicity small echogenic lesion in the right lobe inferiorly, nonspecific, possibly a small hemangioma. This is not evident on the prior CT. No other liver masses or lesions. Portal vein is patent on color Doppler imaging with normal direction of blood flow towards the liver. IVC: No abnormality visualized. Pancreas: Visualized portion unremarkable. Spleen: Size and appearance within normal limits. Right Kidney: Length: 11.3 cm. Echogenicity within normal limits. No mass or hydronephrosis visualized. Left Kidney: Length: 10.1 cm. Echogenicity within normal limits. No mass or hydronephrosis visualized. Abdominal aorta: No aneurysm visualized. Other findings: None. IMPRESSION: 1. No acute findings. 2. Small echogenic area in the inferior aspect of the right liver lobe, nonspecific. Possible small area focal fatty infiltration versus a small hemangioma. This was not evident on the prior CT, which  was an unenhanced study, which supports fatty infiltration. This is felt to be incidental not warranting follow-up. 3. Status post cholecystectomy. Electronically Signed   By: Lajean Manes M.D.   On: 06/19/2020 14:44   DG Chest Portable 1 View  Result Date: 06/15/2020 CLINICAL DATA:  Recent pneumonia, value 8 for infiltrate EXAM: PORTABLE CHEST 1 VIEW COMPARISON:  04/30/2020 FINDINGS: Low volume chest with streaky/indistinct density at the bases which is unchanged. There was lower lobe scarring on April 2021 chest CT. There is no edema, acute consolidation, effusion, or pneumothorax. Normal heart size and stable mediastinal  contours. IMPRESSION: Stable chest with scar-like appearance at the left more right base. Electronically Signed   By: Monte Fantasia M.D.   On: 06/15/2020 08:46   EEG adult  Result Date: 06/17/2020 Derek Jack, MD     06/17/2020 10:44 PM Routine EEG Report Angela Daniel is a 65 y.o. female with a history of encephalopathy and withdrawal seizures who is undergoing an EEG to evaluate for seizures. Report: This EEG was acquired with electrodes placed according to the International 10-20 electrode system (including Fp1, Fp2, F3, F4, C3, C4, P3, P4, O1, O2, T3, T4, T5, T6, A1, A2, Fz, Cz, Pz). The following electrodes were missing or displaced: none. The background was primarily composed of activity in the 6-8 Hz range. There was no clear awake recording and no clear posterior dominant rhythm.This activity is reactive to stimulation. Drowsiness was manifested by background fragmentation; sleep spindles signified deeper sleep. There was no focal slowing. There were no interictal epileptiform discharges. There were no electrographic seizures identified. There was no abnormal response to photic stimulation. Hyperventilation was not performed. Impression and clinical correlation: This EEG was obtained while somnolent and was abnoraml due to mild diffuse slowing signaling global cerebral dysfunction. Su Monks, MD [facility] [phone] If 7pm- 7am, please page neurology on call as listed in [facility].      Subjective: Alert, wants to go home  Discharge Exam: Vitals:   06/23/20 0516 06/23/20 0721  BP: (!) 155/82 (!) 155/71  Pulse: 71 73  Resp: 18 17  Temp: 97.7 F (36.5 C) 98.5 F (36.9 C)  SpO2: 100% 92%     General: Pt is alert, awake, not in acute distress Cardiovascular: RRR, S1/S2 +, no rubs, no gallops Respiratory: CTA bilaterally, no wheezing, no rhonchi Abdominal: Soft, NT, ND, bowel sounds + Extremities: no edema, no cyanosis    The results of significant  diagnostics from this hospitalization (including imaging, microbiology, ancillary and laboratory) are listed below for reference.     Microbiology: Recent Results (from the past 240 hour(s))  Resp Panel by RT-PCR (Flu A&B, Covid)     Status: None   Collection Time: 06/15/20  9:14 AM   Specimen: Nasopharyngeal(NP) swabs in vial transport medium  Result Value Ref Range Status   SARS Coronavirus 2 by RT PCR NEGATIVE NEGATIVE Final    Comment: (NOTE) SARS-CoV-2 target nucleic acids are NOT DETECTED.  The SARS-CoV-2 RNA is generally detectable in upper respiratory specimens during the acute phase of infection. The lowest concentration of SARS-CoV-2 viral copies this assay can detect is 138 copies/mL. A negative result does not preclude SARS-Cov-2 infection and should not be used as the sole basis for treatment or other patient management decisions. A negative result may occur with  improper specimen collection/handling, submission of specimen other than nasopharyngeal swab, presence of viral mutation(s) within the areas targeted by this assay, and inadequate number of viral  copies(<138 copies/mL). A negative result must be combined with clinical observations, patient history, and epidemiological information. The expected result is Negative.  Fact Sheet for Patients:  EntrepreneurPulse.com.au  Fact Sheet for Healthcare Providers:  IncredibleEmployment.be  This test is no t yet approved or cleared by the [place] FDA and  has been authorized for detection and/or diagnosis of SARS-CoV-2 by FDA under an Emergency Use Authorization (EUA). This EUA will remain  in effect (meaning this test can be used) for the duration of the COVID-19 declaration under Section 564(b)(1) of the Act, 21 U.S.C.section 360bbb-3(b)(1), unless the authorization is terminated  or revoked sooner.       Influenza A by PCR NEGATIVE NEGATIVE Final   Influenza B by PCR  NEGATIVE NEGATIVE Final    Comment: (NOTE) The Xpert Xpress SARS-CoV-2/FLU/RSV plus assay is intended as an aid in the diagnosis of influenza from Nasopharyngeal swab specimens and should not be used as a sole basis for treatment. Nasal washings and aspirates are unacceptable for Xpert Xpress SARS-CoV-2/FLU/RSV testing.  Fact Sheet for Patients: EntrepreneurPulse.com.au  Fact Sheet for Healthcare Providers: IncredibleEmployment.be  This test is not yet approved or cleared by the [place] FDA and has been authorized for detection and/or diagnosis of SARS-CoV-2 by FDA under an Emergency Use Authorization (EUA). This EUA will remain in effect (meaning this test can be used) for the duration of the COVID-19 declaration under Section 564(b)(1) of the Act, 21 U.S.C. section 360bbb-3(b)(1), unless the authorization is terminated or revoked.  Performed at [facility], [address] [place], [place] [address]      Labs: BNP (last 3 results) Recent Labs    04/30/20 0605  BNP 604.5*   Basic Metabolic Panel: Recent Labs  Lab 06/17/20 0423 06/18/20 0448 06/19/20 1302 06/21/20 0430 06/22/20 0404 06/23/20 0424  NA 140 137  --  139 140 141  K 4.9 4.5  --  4.3 4.1 3.8  CL 103 101  --  101 103 104  CO2 27 27  --  30 30 30   GLUCOSE 87 94  --  114* 98 120*  BUN 14 13  --  8 7* 8  CREATININE 0.66 0.56  --  0.64 0.50 0.51  CALCIUM 9.0 8.9  --  9.3 9.2 9.0  MG  --  1.5* 1.9 1.7 1.8 1.9   Liver Function Tests: Recent Labs  Lab 06/18/20 0448 06/20/20 0402 06/21/20 0430  AST 244* 85* 25  ALT 82* 53* 34  ALKPHOS 99 127* 97  BILITOT 0.9 0.7 0.3  PROT 6.6 6.4* 6.4*  ALBUMIN 3.3* 3.1* 3.1*   No results for input(s): LIPASE, AMYLASE in the last 168 hours. No results for input(s): AMMONIA in the last 168 hours. CBC: Recent Labs  Lab 06/17/20 0423 06/18/20 0448 06/21/20 0430 06/22/20 0404 06/23/20 0424  WBC 13.7* 6.4 5.6  5.6 4.6  NEUTROABS  --   --  3.4 3.4 2.9  HGB 13.3 12.8 11.7* 11.3* 10.8*  HCT 39.0 38.2 35.1* 34.1* 32.1*  MCV 92.2 94.8 96.4 96.3 94.1  PLT 153 149* 173 189 191   Cardiac Enzymes: No results for input(s): CKTOTAL, CKMB, CKMBINDEX, TROPONINI in the last 168 hours. BNP: Invalid input(s): POCBNP CBG: Recent Labs  Lab 06/22/20 1546 06/22/20 2042 06/23/20 0018 06/23/20 0516 06/23/20 0730  GLUCAP 134* 100* 112* 104* 137*   D-Dimer No results for input(s): DDIMER in the last 72 hours. Hgb A1c No results for input(s): HGBA1C in the last 72  hours. Lipid Profile No results for input(s): CHOL, HDL, LDLCALC, TRIG, CHOLHDL, LDLDIRECT in the last 72 hours. Thyroid function studies No results for input(s): TSH, T4TOTAL, T3FREE, THYROIDAB in the last 72 hours.  Invalid input(s): FREET3 Anemia work up No results for input(s): VITAMINB12, FOLATE, FERRITIN, TIBC, IRON, RETICCTPCT in the last 72 hours. Urinalysis    Component Value Date/Time   COLORURINE YELLOW (A) 06/15/2020 0914   APPEARANCEUR HAZY (A) 06/15/2020 0914   APPEARANCEUR Cloudy (A) 10/22/2019 1025   LABSPEC 1.017 06/15/2020 0914   PHURINE 6.0 06/15/2020 0914   GLUCOSEU NEGATIVE 06/15/2020 0914   HGBUR NEGATIVE 06/15/2020 0914   BILIRUBINUR NEGATIVE 06/15/2020 0914   BILIRUBINUR Negative 10/22/2019 1025   [place] 06/15/2020 0914   PROTEINUR 30 (A) 06/15/2020 0914   NITRITE NEGATIVE 06/15/2020 0914   LEUKOCYTESUR NEGATIVE 06/15/2020 0914   Sepsis Labs Invalid input(s): PROCALCITONIN,  WBC,  LACTICIDVEN Microbiology Recent Results (from the past 240 hour(s))  Resp Panel by RT-PCR (Flu A&B, Covid)     Status: None   Collection Time: 06/15/20  9:14 AM   Specimen: Nasopharyngeal(NP) swabs in vial transport medium  Result Value Ref Range Status   SARS Coronavirus 2 by RT PCR NEGATIVE NEGATIVE Final    Comment: (NOTE) SARS-CoV-2 target nucleic acids are NOT DETECTED.  The SARS-CoV-2 RNA is generally  detectable in upper respiratory specimens during the acute phase of infection. The lowest concentration of SARS-CoV-2 viral copies this assay can detect is 138 copies/mL. A negative result does not preclude SARS-Cov-2 infection and should not be used as the sole basis for treatment or other patient management decisions. A negative result may occur with  improper specimen collection/handling, submission of specimen other than nasopharyngeal swab, presence of viral mutation(s) within the areas targeted by this assay, and inadequate number of viral copies(<138 copies/mL). A negative result must be combined with clinical observations, patient history, and epidemiological information. The expected result is Negative.  Fact Sheet for Patients:  EntrepreneurPulse.com.au  Fact Sheet for Healthcare Providers:  IncredibleEmployment.be  This test is no t yet approved or cleared by the [place] FDA and  has been authorized for detection and/or diagnosis of SARS-CoV-2 by FDA under an Emergency Use Authorization (EUA). This EUA will remain  in effect (meaning this test can be used) for the duration of the COVID-19 declaration under Section 564(b)(1) of the Act, 21 U.S.C.section 360bbb-3(b)(1), unless the authorization is terminated  or revoked sooner.       Influenza A by PCR NEGATIVE NEGATIVE Final   Influenza B by PCR NEGATIVE NEGATIVE Final    Comment: (NOTE) The Xpert Xpress SARS-CoV-2/FLU/RSV plus assay is intended as an aid in the diagnosis of influenza from Nasopharyngeal swab specimens and should not be used as a sole basis for treatment. Nasal washings and aspirates are unacceptable for Xpert Xpress SARS-CoV-2/FLU/RSV testing.  Fact Sheet for Patients: EntrepreneurPulse.com.au  Fact Sheet for Healthcare Providers: IncredibleEmployment.be  This test is not yet approved or cleared by the [place] FDA  and has been authorized for detection and/or diagnosis of SARS-CoV-2 by FDA under an Emergency Use Authorization (EUA). This EUA will remain in effect (meaning this test can be used) for the duration of the COVID-19 declaration under Section 564(b)(1) of the Act, 21 U.S.C. section 360bbb-3(b)(1), unless the authorization is terminated or revoked.  Performed at [facility], [address], [place], [place] [address]      Time coordinating discharge: 40 minutes  SIGNED:   Jerald Kief  Desiree Lucy, MD  [facility]

## 2020-06-23 NOTE — Care Management Important Message (Signed)
Important Message  Patient Details  Name: Angela Daniel MRN: [id number] Date of Birth: [birth year]   Medicare Important Message Given:  Yes     Olegario Messier [name] 06/23/2020, 10:15 AM

## 2020-06-23 NOTE — TOC Transition Note (Signed)
Transition of Care [facility]) - CM/SW Discharge Note   Patient Details  Name: Angela Daniel MRN: [id number] Date of Birth: [birth year]  Transition of Care [facility]) CM/SW Contact:  Allayne Butcher, RN Phone Number: 06/23/2020, 10:28 AM   Clinical Narrative:    Patient is medically cleared for discharge home with home health services.  Barbara Cower with [facility] notified of discharge today.  Patient's daughter Marcelino Duster updated on DC and she will be coming to pick the patient up.  Walker in the room delivered by [facility] for patient to take home with her.  No other discharge needs identified at this time.    Final next level of care: Home w Home Health Services Barriers to Discharge: Barriers Resolved   Patient Goals and CMS Choice Patient states their goals for this hospitalization and ongoing recovery are:: Patient wants to go home CMS Medicare.gov Compare Post Acute Care list provided to:: Patient Choice offered to / list presented to : Patient,Adult Children  Discharge Placement                       Discharge Plan and Services   Discharge Planning Services: CM Consult Post Acute Care Choice: Home Health          DME Arranged: Walker rolling DME Agency: [facility] Date DME Agency Contacted: 06/21/20 Time DME Agency Contacted: 1339 Representative spoke with at DME Agency: Bjorn Loser HH Arranged: RN,PT,OT HH Agency: [facility] Date HH Agency Contacted: 06/23/20 Time HH Agency Contacted: 1028 Representative spoke with at [facility] Agency: Barbara Cower  Social Determinants of Health (SDOH) Interventions     Readmission Risk Interventions No flowsheet data found.

## 2020-06-23 NOTE — Discharge Instructions (Signed)
Delirium Tremens Delirium tremens, also called DTs, is the worst kind of alcohol withdrawal. It happens when you stop drinking or you drink less than usual. DTs is an emergency. You need to be treated in a hospital or a treatment center right away. What are the causes? DTs happens when you drink a lot and often and then stop drinking. It can also happen if you drink less than you usually drink. What increases the risk? The more a person drinks and the longer he or she drinks, the greater the risk of DTs. This condition is more likely to develop in those who have a history of drinking heavily and:  Have had alcohol withdrawal or DTs in the past.  Have had a seizure during past alcohol withdrawal.  Are elderly.  Are pregnant.  Use drugs.  Take medicines to help you relax (sedatives).  Have medical problems, including: ? Infections. ? Heart, lung, or liver disease. ? Seizures. ? Behavioral health conditions. What are the signs or symptoms? Early symptoms of alcohol withdrawal happen before symptoms of DTs. These symptoms may:  Start within 6 hours after the most recent drink.  Last for 5-7 days. Early symptoms may include:  Problems sleeping  Problems thinking clearly.  Feeling worried or nervous (anxious).  Feeling moodier and more upset than usual.  Fast breathing.  Headache.  Feeling sick to your stomach (nauseous) and vomiting.  Fever.  Sweating.  A heartbeat that feels faster than normal.  A heartbeat that feels like it skips a beat or flutters.  Shaking (tremor) of a body part. Symptoms of DTs may:  Start to develop 3 days after the most recent drink.  Last for up to 8 days. Symptoms include:  Changes in attention and awareness.  Changes in heart rate, temperature, blood pressure, and oxygen levels. Most often, the heart rate and body temperature will rise.  Feeling or seeing things that are not really there (hallucinations).  Extreme tiredness  and not being able to wake up.  Extreme confusion.  Seizures. How is this treated? Treatment for this condition is done in a hospital or treatment center. It may include:  Monitoring your temperature, heart rate, blood pressure, and oxygen.  Fluids and minerals given through an IV.  Medicines to help you relax.  Medicines that help you worry less.  Medicines to control your blood pressure.  Medicines to prevent or control seizures.  Vitamins.  Nutrition by mouth or through an IV.  Medicines that help you feel less sick to your stomach.  Talking to a counselor. Follow these instructions at home: Medicines  Take over-the-counter and prescription medicines only as told by your doctor.  If you are prescribed medicines to help you relax, take them only as told by your doctor. Eating and drinking  Do not drink alcohol.  Drink enough fluid to keep your pee (urine) pale yellow.   General instructions  Do not drive or use heavy machinery until your doctor approves.  If you drink a lot and often, do not try to stop drinking without help from your doctor.  Have someone stay with you in case you need help.  Think about joining an alcohol support group.  Keep all follow-up visits as told by your doctor. This is important. Contact a doctor if:  You have symptoms of alcohol withdrawal that get worse or do not go away.  You feel sad (depressed).  You drink alcohol even though you were trying not to.  You cannot eat   or drink without vomiting.  You are not able to stop drinking alcohol. Get help right away if:  You feel you may hurt yourself or others.  You have a seizure.  You have a fever.  You get very confused.  You feel or see things that are not there.  You are very sleepy and have trouble staying awake.  You vomit blood.  You shake or tremble very badly.  You sweat a lot.  You feel like your heart is beating too fast, skips a beat, or  flutters.  You have chest pain.  You have trouble breathing. If you ever feel like you may hurt yourself or others, or have thoughts about taking your own life, get help right away. You can go to your nearest emergency department or call:  Your local emergency services (911 in the [place]  A suicide crisis helpline, such as the National Suicide Prevention Lifeline at 1-800-273-8255. This is open 24 hours a day. These symptoms may be an emergency. Do not wait to see if the symptoms will go away. Get medical help right away. Call your local emergency services (911 in the [place] Do not drive yourself to the hospital. Summary  DTs is the worst kind of alcohol withdrawal. It happens when you stop drinking or you drink less.  DTs can be life-threatening.  You need to be treated in a hospital or a treatment center right away.  The more a person drinks and the longer he or she drinks, the greater the risk of DTs.  Contact a doctor if you drink alcohol even though you were trying not to. This information is not intended to replace advice given to you by your health care provider. Make sure you discuss any questions you have with your health care provider. Document Revised: 01/26/2018 Document Reviewed: 01/26/2018 Elsevier Patient Education  2021 [employer] Inc.  

## 2020-08-08 ENCOUNTER — Other Ambulatory Visit: Payer: Self-pay | Admitting: Physical Medicine & Rehabilitation

## 2020-08-08 DIAGNOSIS — M542 Cervicalgia: Secondary | ICD-10-CM

## 2020-08-24 ENCOUNTER — Other Ambulatory Visit (HOSPITAL_COMMUNITY): Payer: Self-pay | Admitting: Orthopedic Surgery

## 2020-08-24 ENCOUNTER — Other Ambulatory Visit: Payer: Self-pay | Admitting: Orthopedic Surgery

## 2020-08-24 DIAGNOSIS — M75102 Unspecified rotator cuff tear or rupture of left shoulder, not specified as traumatic: Secondary | ICD-10-CM

## 2020-08-24 DIAGNOSIS — G8929 Other chronic pain: Secondary | ICD-10-CM

## 2020-08-24 DIAGNOSIS — M25512 Pain in left shoulder: Secondary | ICD-10-CM

## 2020-09-07 ENCOUNTER — Ambulatory Visit
Admission: RE | Admit: 2020-09-07 | Discharge: 2020-09-07 | Disposition: A | Discharge status: Home / Self Care | Payer: Medicare Other | Source: Ambulatory Visit | Attending: Orthopedic Surgery | Admitting: Orthopedic Surgery

## 2020-09-07 ENCOUNTER — Other Ambulatory Visit: Payer: Self-pay

## 2020-09-07 DIAGNOSIS — M25512 Pain in left shoulder: Secondary | ICD-10-CM | POA: Insufficient documentation

## 2020-09-07 DIAGNOSIS — M75102 Unspecified rotator cuff tear or rupture of left shoulder, not specified as traumatic: Secondary | ICD-10-CM | POA: Diagnosis present

## 2020-09-07 DIAGNOSIS — G8929 Other chronic pain: Secondary | ICD-10-CM | POA: Diagnosis present

## 2020-10-16 ENCOUNTER — Other Ambulatory Visit: Payer: Self-pay | Admitting: Neurology

## 2020-10-16 DIAGNOSIS — R569 Unspecified convulsions: Secondary | ICD-10-CM

## 2020-10-23 ENCOUNTER — Ambulatory Visit: Admission: RE | Admit: 2020-10-23 | Payer: 59 | Source: Ambulatory Visit

## 2021-01-25 IMAGING — CR LUMBAR SPINE - 2-3 VIEW
1 series · 3 of 3 positions shown · non-contrast
Comparison: 02/17/2018.

CLINICAL DATA: Low back pain with pain radiating down left leg.

EXAM:
LUMBAR SPINE - 2-3 VIEW

[Series 1: dg lumbar spine 2-3 views · 0.14mm/px · 3 of 3 slices shown]
[im 1/3]
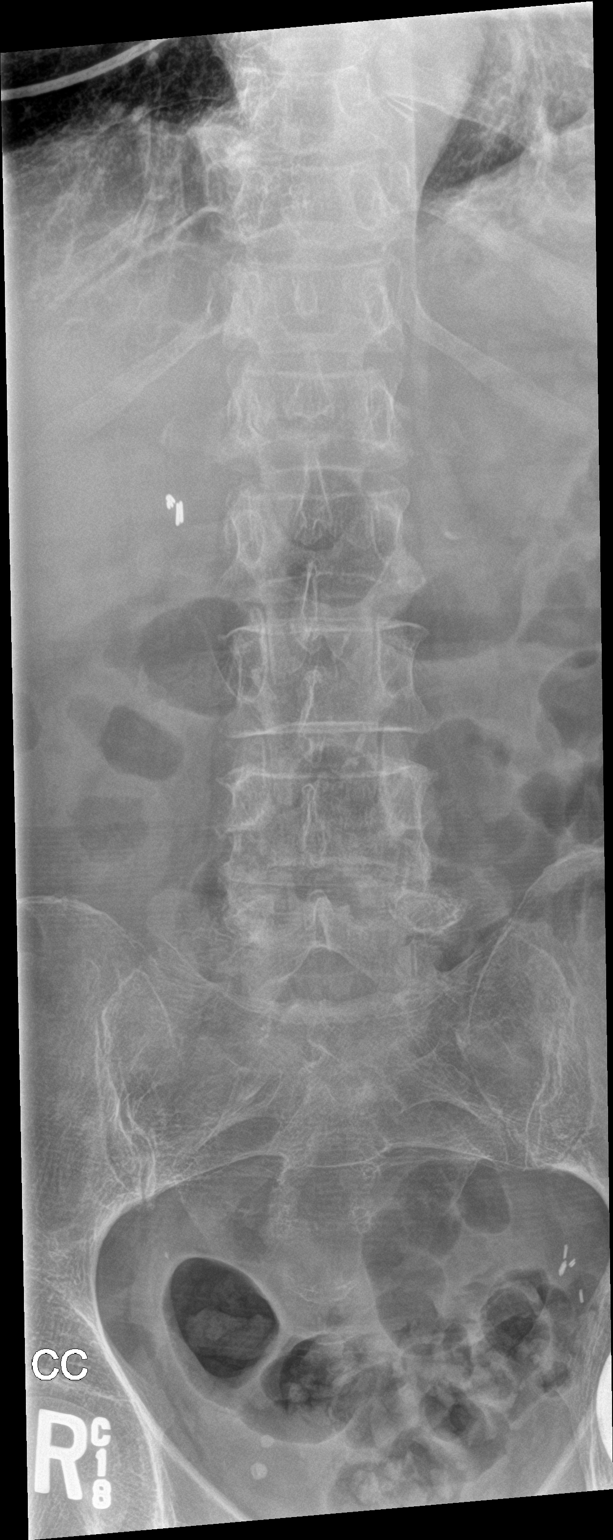
[im 2/3]
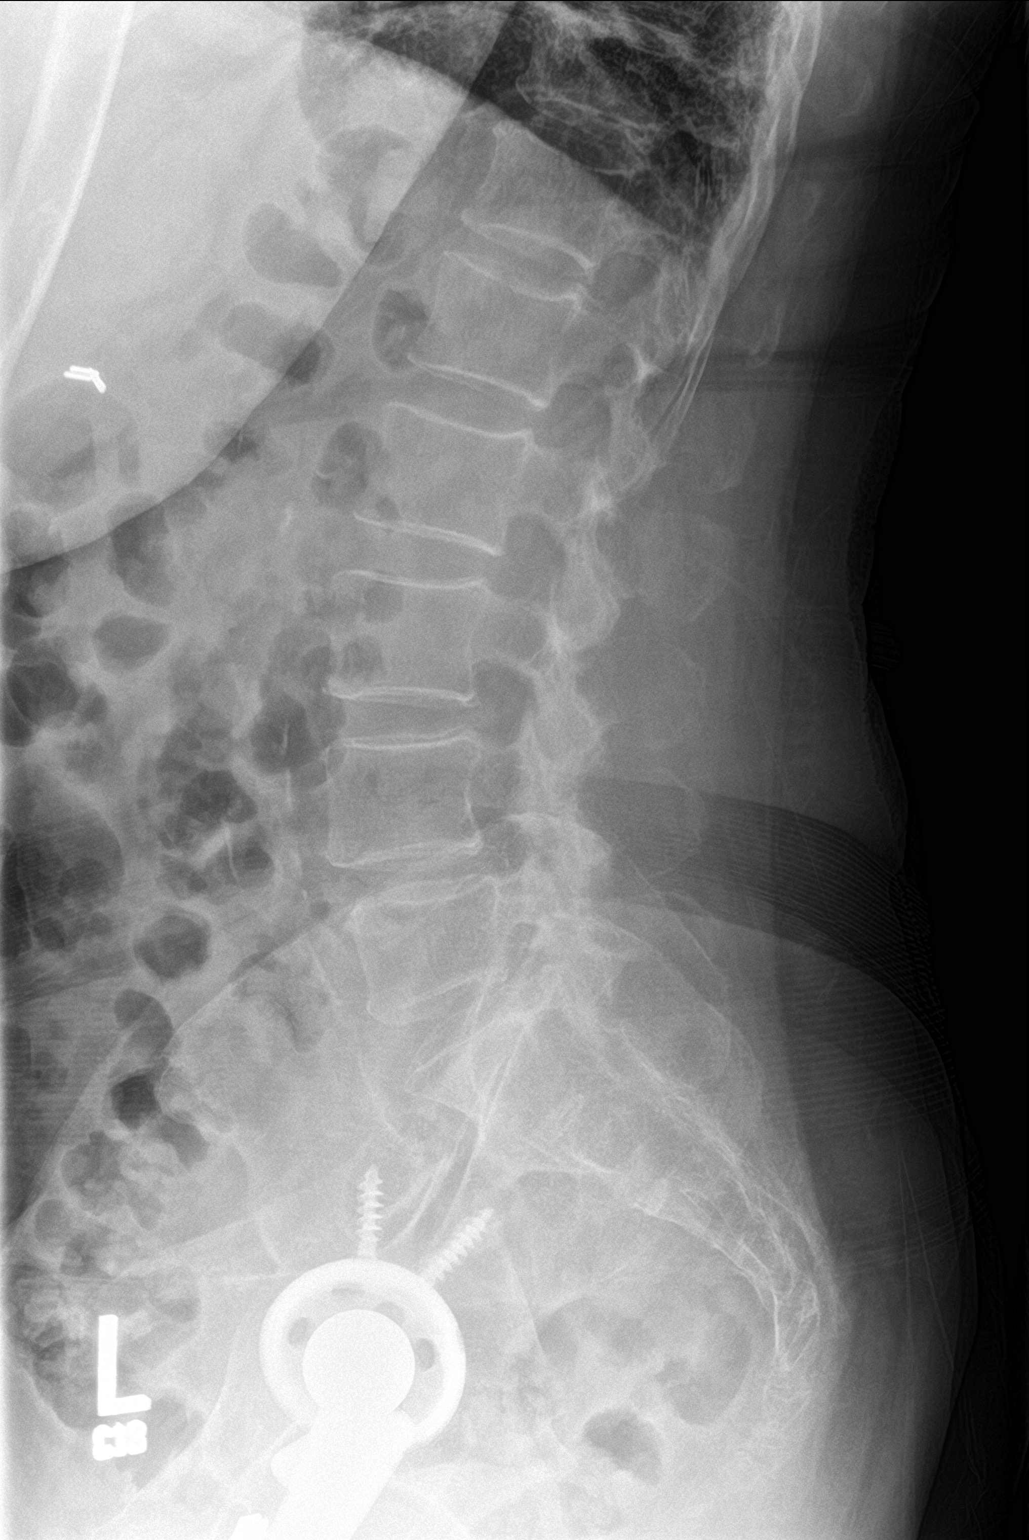
[im 3/3]
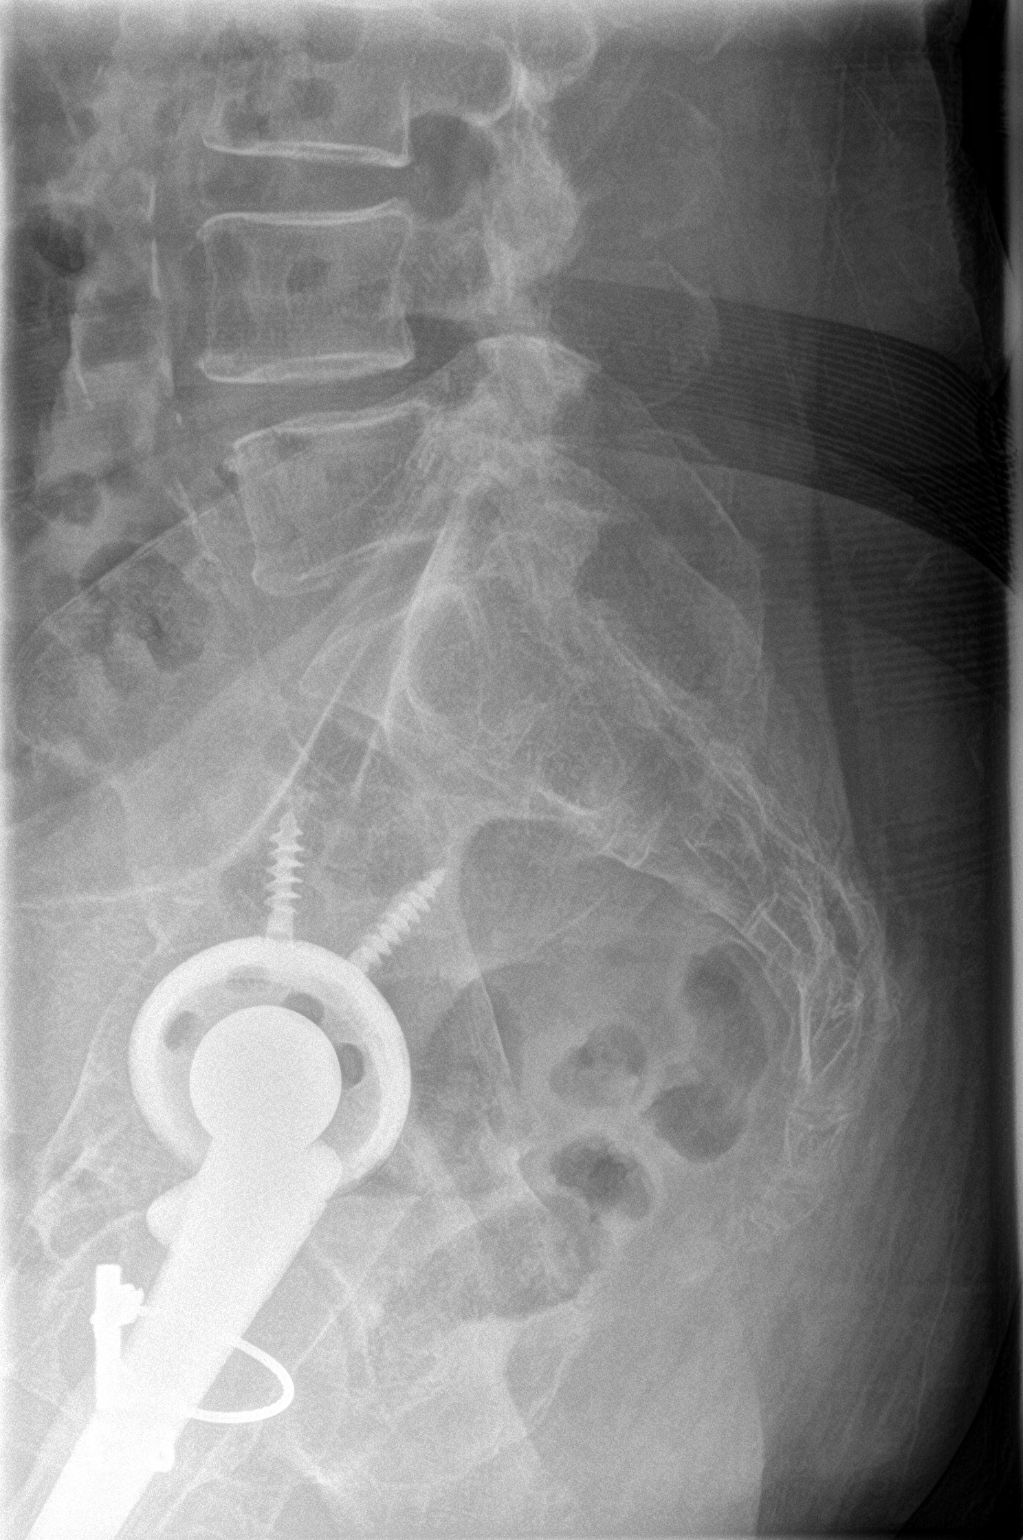

[3 of 3 positions shown; findings below may reference images not displayed]

FINDINGS: Surgical clips right upper quadrant. Aortoiliac and left renal
vascular calcification again noted. No acute bony abnormality
identified. No evidence of fracture dislocation. Total left hip
replacement. Bibasilar atelectasis/infiltrates.
IMPRESSION: 1. Degenerative change lumbar spine. No acute bony abnormality.
Normal alignment.

2.  Aortoiliac and left renal vascular disease.

## 2021-09-05 ENCOUNTER — Encounter: Payer: Self-pay | Admitting: Emergency Medicine

## 2021-09-05 ENCOUNTER — Emergency Department
Admission: EM | Admit: 2021-09-05 | Discharge: 2021-09-05 | Disposition: A | Discharge status: Home / Self Care | Payer: Medicare Other | Attending: Emergency Medicine | Admitting: Emergency Medicine

## 2021-09-05 ENCOUNTER — Emergency Department: Payer: Medicare Other

## 2021-09-05 ENCOUNTER — Other Ambulatory Visit: Payer: Self-pay

## 2021-09-05 DIAGNOSIS — E119 Type 2 diabetes mellitus without complications: Secondary | ICD-10-CM | POA: Diagnosis not present

## 2021-09-05 DIAGNOSIS — I110 Hypertensive heart disease with heart failure: Secondary | ICD-10-CM | POA: Insufficient documentation

## 2021-09-05 DIAGNOSIS — R0789 Other chest pain: Secondary | ICD-10-CM | POA: Insufficient documentation

## 2021-09-05 DIAGNOSIS — R079 Chest pain, unspecified: Secondary | ICD-10-CM | POA: Diagnosis present

## 2021-09-05 DIAGNOSIS — I509 Heart failure, unspecified: Secondary | ICD-10-CM | POA: Diagnosis not present

## 2021-09-05 DIAGNOSIS — J449 Chronic obstructive pulmonary disease, unspecified: Secondary | ICD-10-CM | POA: Insufficient documentation

## 2021-09-05 LAB — COMPREHENSIVE METABOLIC PANEL
ALT: 13 U/L (ref 0–44)
AST: 20 U/L (ref 15–41)
Albumin: 3.6 g/dL (ref 3.5–5.0)
Alkaline Phosphatase: 63 U/L (ref 38–126)
Anion gap: 3 — ABNORMAL LOW (ref 5–15)
BUN: 16 mg/dL (ref 8–23)
CO2: 30 mmol/L (ref 22–32)
Calcium: 8.9 mg/dL (ref 8.9–10.3)
Chloride: 108 mmol/L (ref 98–111)
Creatinine, Ser: 0.76 mg/dL (ref 0.44–1.00)
GFR, Estimated: >60 mL/min (ref >60)
Glucose, Bld: 115 mg/dL — ABNORMAL HIGH (ref 70–99)
Potassium: 4.3 mmol/L (ref 3.5–5.1)
Sodium: 141 mmol/L (ref 135–145)
Total Bilirubin: 0.3 mg/dL (ref 0.3–1.2)
Total Protein: 6.6 g/dL (ref 6.5–8.1)

## 2021-09-05 LAB — D-DIMER, QUANTITATIVE: D-Dimer, Quant: 0.48 ug/mL-FEU (ref 0.00–0.50)

## 2021-09-05 LAB — CBC
HCT: 35.6 % — ABNORMAL LOW (ref 36.0–46.0)
Hemoglobin: 11.3 g/dL — ABNORMAL LOW (ref 12.0–15.0)
MCH: 30.8 pg (ref 26.0–34.0)
MCHC: 31.7 g/dL (ref 30.0–36.0)
MCV: 97 fL (ref 80.0–100.0)
Platelets: 188 10*3/uL (ref 150–400)
RBC: 3.67 MIL/uL — ABNORMAL LOW (ref 3.87–5.11)
RDW: 12.7 % (ref 11.5–15.5)
WBC: 7.2 10*3/uL (ref 4.0–10.5)
nRBC: 0 % (ref 0.0–0.2)

## 2021-09-05 LAB — LIPASE, BLOOD: Lipase: 22 U/L (ref 11–51)

## 2021-09-05 LAB — TROPONIN I (HIGH SENSITIVITY)
Troponin I (High Sensitivity): 4 ng/L (ref <18)
Troponin I (High Sensitivity): 4 ng/L (ref <18)

## 2021-09-05 MED ORDER — ASPIRIN 81 MG PO CHEW
324.0000 mg | CHEWABLE_TABLET | Freq: Once | ORAL | Status: AC
  Administered 2021-09-05: 324 mg via ORAL

## 2021-09-05 NOTE — ED Provider Notes (Signed)
[facility] Provider Note    Event Date/Time   First MD Initiated Contact with Patient 09/05/21 1116     (approximate)   History   Chest Pain   HPI  Angela Daniel is a 66 y.o. female  hx CHF, COPD, DM, fibromyalgia, HTN with chest pain, nausea, L arm pain starting last night wakened from sleep 2AM. L arm pain ongoing now for a month, she says her cardiologist knows. CP and nausea new from rest last night. Moderate pain, non exertional w/ no significant dyspnea, swelling, and denies resp infx sx.  She called her allergy office this morning to tell them of the chest pain and they asked her to come to the emergency department for evaluation.       Physical Exam   Triage Vital Signs: ED Triage Vitals [09/05/21 1111]  Enc Vitals Group     BP 107/66     Pulse Rate 66     Resp 18     Temp 98 F (36.7 C)     Temp Source Oral     SpO2 100 %     Weight      Height      Head Circumference      Peak Flow      Pain Score 10     Pain Loc      Pain Edu?      Excl. in GC?     Most recent vital signs: Vitals:   09/05/21 1111 09/05/21 1406  BP: 107/66 110/70  Pulse: 66 68  Resp: 18 17  Temp: 98 F (36.7 C) 98.7 F (37.1 C)  SpO2: 100% 98%     General: Awake, no distress.  CV:  Good peripheral perfusion.  Pulses intact and equal in bilateral wrists.  Heart sounds regular rate and rhythm with no murmurs Resp:  Normal effort.  Clear to auscultation bilaterally without rales or focality. Abd:  No distention.  Nontender deep palpation all quadrants.  ED Results / Procedures / Treatments   Labs (all labs ordered are listed, but only abnormal results are displayed) Labs Reviewed  CBC - Abnormal; Notable for the following components:      Result Value   RBC 3.67 (*)    Hemoglobin 11.3 (*)    HCT 35.6 (*)    All other components within normal limits  COMPREHENSIVE METABOLIC PANEL - Abnormal; Notable for the following components:   Glucose, Bld  115 (*)    Anion gap 3 (*)    All other components within normal limits  LIPASE, BLOOD  D-DIMER, QUANTITATIVE  TROPONIN I (HIGH SENSITIVITY)  TROPONIN I (HIGH SENSITIVITY)     EKG  ED ECG REPORT I, Pilar Jarvis, the attending physician, personally viewed and interpreted this ECG.   Date: 09/05/2021  EKG Time: 1106  Rate: 53  Rhythm: normal EKG, no significant ischemic changes noted from previous tracings, sinus bradycardia  Axis: Normal  Intervals:none  ST&T Change: No ischemic changes noted.    RADIOLOGY I independently reviewed and interpreted chest x-ray and see no obvious focal consolidations or pneumothorax.   PROCEDURES:  Critical Care performed: No  Procedures   MEDICATIONS ORDERED IN ED: Medications  aspirin chewable tablet 324 mg (324 mg Oral Given 09/05/21 1212)     IMPRESSION / MDM / ASSESSMENT AND PLAN / ED COURSE  I reviewed the triage vital signs and the nursing notes.  Differential diagnosis includes, but is not limited to, ACS, GERD, chest wall pain.  Less likely pneumothorax, dissection, PE.  Patient with multiple cardiac risk factors presenting with chest pain and radiation to left arm concerning for ACS.  Work-up included EKG and troponins which were negative for ischemic changes and flat troponins x2.  Patient has been stable in the emergency department.  Less likely other emergent differential diagnosis including dissection and PE considered but unlikely given atypical pain qualities and no dyspnea, normal hemodynamics, and dimer was ordered and negative.  I considered admission for this patient with cardiac risk factors and chest pain, however given flat troponins and stable patient in the emergency department I think outpatient management would be appropriate.  I spoke with on-call covering provider cardiologist who agree with plan for discharge given flat troponins unlikely cardiac, follow-up outpatient.  Patient's  presentation is most consistent with acute presentation with potential threat to life or bodily function.     FINAL CLINICAL IMPRESSION(S) / ED DIAGNOSES   Final diagnoses:  Other chest pain     Rx / DC Orders   ED Discharge Orders     None        Note:  This document was prepared using Dragon voice recognition software and may include unintentional dictation errors.    Pilar Jarvis, MD 09/05/21 1414

## 2021-09-05 NOTE — Discharge Instructions (Signed)
Call Dr. Santo Held office to schedule a follow up visit for chest pain.

## 2021-09-05 NOTE — ED Triage Notes (Signed)
Patient c/o left arm pain, shortness of breath and chest pain onset throughout the night. States the left arm has been bothering her for a while but other symptoms started last night. Chest pain in center of chest and is non radiating. Describes it as her "heart hurting."

## 2021-12-10 ENCOUNTER — Encounter: Payer: Self-pay | Admitting: Obstetrics and Gynecology

## 2021-12-10 DIAGNOSIS — Z1231 Encounter for screening mammogram for malignant neoplasm of breast: Secondary | ICD-10-CM

## 2021-12-20 ENCOUNTER — Other Ambulatory Visit: Payer: Self-pay | Admitting: Obstetrics and Gynecology

## 2021-12-20 DIAGNOSIS — Z1231 Encounter for screening mammogram for malignant neoplasm of breast: Secondary | ICD-10-CM

## 2022-03-05 ENCOUNTER — Encounter: Payer: Self-pay | Admitting: Cardiovascular Disease

## 2022-03-05 ENCOUNTER — Ambulatory Visit (INDEPENDENT_AMBULATORY_CARE_PROVIDER_SITE_OTHER): Payer: 59 | Admitting: Cardiovascular Disease

## 2022-03-05 ENCOUNTER — Ambulatory Visit (INDEPENDENT_AMBULATORY_CARE_PROVIDER_SITE_OTHER): Payer: 59

## 2022-03-05 VITALS — BP 130/80 | HR 84 | Ht 63.0 in | Wt 148.4 lb

## 2022-03-05 DIAGNOSIS — F17200 Nicotine dependence, unspecified, uncomplicated: Secondary | ICD-10-CM | POA: Diagnosis not present

## 2022-03-05 DIAGNOSIS — I5020 Unspecified systolic (congestive) heart failure: Secondary | ICD-10-CM | POA: Diagnosis not present

## 2022-03-05 DIAGNOSIS — F17210 Nicotine dependence, cigarettes, uncomplicated: Secondary | ICD-10-CM

## 2022-03-05 DIAGNOSIS — I10 Essential (primary) hypertension: Secondary | ICD-10-CM

## 2022-03-05 DIAGNOSIS — J441 Chronic obstructive pulmonary disease with (acute) exacerbation: Secondary | ICD-10-CM

## 2022-03-05 DIAGNOSIS — E119 Type 2 diabetes mellitus without complications: Secondary | ICD-10-CM

## 2022-03-05 MED ORDER — FUROSEMIDE 40 MG PO TABS: 40.0000 mg | ORAL_TABLET | Freq: Every day | ORAL | 11 refills | Status: DC

## 2022-03-05 MED ORDER — LEVOFLOXACIN 500 MG PO TABS: 500.0000 mg | ORAL_TABLET | Freq: Every day | ORAL | 0 refills | Status: AC

## 2022-03-05 NOTE — Assessment & Plan Note (Signed)
SOB with cough with gret sputum, will do cxr and place on levaquin and increase diuretics

## 2022-03-05 NOTE — Progress Notes (Signed)
Cardiology Office Note   Date:  03/05/2022   ID:  Journi, Canipe [birth year], MRN [id number]  PCP:  Center, Jay, NP  Cardiologist:  Neoma Laming, MD      History of Present Illness: Angela Daniel is a 67 y.o. female who presents for  Chief Complaint  Patient presents with   Follow-up    2 month follow up    Shortness of Breath This is a new problem. The current episode started 1 to 4 weeks ago. The problem occurs every few minutes. The problem has been rapidly worsening. Associated symptoms include chest pain and sputum production.      Past Medical History:  Diagnosis Date   Congestive heart failure (HCC)    COPD (chronic obstructive pulmonary disease) ([place])    Degenerative joint disease    Diabetes mellitus without complication (HCC)    Fibromyalgia    GERD (gastroesophageal reflux disease)    Hypertension    Scoliosis    Sleep apnea      Past Surgical History:  Procedure Laterality Date   ABDOMINAL HYSTERECTOMY     APPENDECTOMY     CESAREAN SECTION     x3   CHOLECYSTECTOMY     HIP SURGERY Left    joint replacement   KYPHOPLASTY N/A 05/18/2019   Procedure: T6 KYPHOPLASTY;  Surgeon: Hessie Knows, MD;  Location: [facility] ORS;  Service: Orthopedics;  Laterality: N/A;     Current Outpatient Medications  Medication Sig Dispense Refill   ALPRAZolam (XANAX) 1 MG tablet Take 1 tablet (1 mg total) by mouth 3 (three) times daily. 30 tablet 0   amLODipine (NORVASC) 2.5 MG tablet Take 2.5 mg by mouth daily.     amphetamine-dextroamphetamine (ADDERALL) 10 MG tablet Take 1 tablet (10 mg total) by mouth daily with breakfast. 30 tablet 0   aspirin EC 81 MG tablet Take 81 mg by mouth daily.     blood glucose meter kit and supplies KIT Dispense based on patient and insurance preference. Use up to four times daily as directed. Check blood sugar three times a day 1 each 0   buprenorphine (SUBUTEX) 8 MG SUBL SL tablet Place 8 mg under the tongue 3  (three) times daily.     Cholecalciferol (VITAMIN D3) 50 MCG (2000 UT) TABS Take 2,000 mg by mouth daily.     Cyanocobalamin 2500 MCG CHEW Chew 2,500 mcg by mouth daily.     diclofenac (VOLTAREN) 50 MG EC tablet Take 50 mg by mouth 2 (two) times daily as needed for mild pain.     esomeprazole (NEXIUM) 40 MG capsule Take 40 mg by mouth daily.     Fluticasone-Salmeterol (ADVAIR) 250-50 MCG/DOSE AEPB Inhale 1 puff into the lungs 2 (two) times daily.     furosemide (LASIX) 40 MG tablet Take 1 tablet (40 mg total) by mouth daily. 30 tablet 11   isosorbide mononitrate (IMDUR) 30 MG 24 hr tablet Take 30 mg by mouth daily.     levETIRAcetam (KEPPRA) 500 MG tablet Take 1 tablet (500 mg total) by mouth 2 (two) times daily. 60 tablet 3   levofloxacin (LEVAQUIN) 500 MG tablet Take 1 tablet (500 mg total) by mouth daily for 10 days. 10 tablet 0   lisinopril (ZESTRIL) 10 MG tablet Take 10 mg by mouth daily.     metFORMIN (GLUCOPHAGE) 500 MG tablet Take 500 mg by mouth 2 (two) times daily with a meal.      metoprolol  succinate (TOPROL-XL) 50 MG 24 hr tablet Take 50 mg by mouth daily.     NARCAN 4 MG/0.1ML LIQD nasal spray kit Place 1 spray into the nose as directed.     Omega-3 Fatty Acids (FISH OIL) 500 MG CAPS Take 1 capsule by mouth 1 day or 1 dose.     pantoprazole (PROTONIX) 20 MG tablet Take 20 mg by mouth daily.     polyethylene glycol (MIRALAX / GLYCOLAX) 17 g packet Take 17 g by mouth daily. 14 each 0   rosuvastatin (CRESTOR) 20 MG tablet Take 20 mg by mouth daily.     Vitamin E 400 units TABS Take 400 Units by mouth daily.     fluticasone (FLONASE) 50 MCG/ACT nasal spray Place 1 spray into both nostrils at bedtime. (Patient not taking: Reported on 03/05/2022)     magnesium oxide (MAG-OX) 400 (240 Mg) MG tablet Take 1 tablet (400 mg total) by mouth 2 (two) times daily. (Patient not taking: Reported on 03/05/2022) 30 tablet 0   senna-docusate (SENOKOT-S) 8.6-50 MG tablet Take 1 tablet by mouth 2 (two)  times daily. (Patient not taking: Reported on 03/05/2022) 30 tablet 0   zinc gluconate 50 MG tablet Take 50 mg by mouth daily. (Patient not taking: Reported on 03/05/2022)     No current facility-administered medications for this visit.    Allergies:   Tramadol, Tylenol [acetaminophen], and Vicodin [hydrocodone-acetaminophen]    Social History:   reports that she has been smoking cigarettes. She has been smoking an average of .5 packs per day. She has never used smokeless tobacco. She reports that she does not drink alcohol and does not use drugs.   Family History:  family history includes Asthma in her father; CVA in her mother; Diabetes in her mother; Heart attack in her father; Hypertension in her father; Lung cancer in her father and mother.    ROS:     Review of Systems  Constitutional: Negative.   HENT: Negative.    Eyes: Negative.   Respiratory:  Positive for sputum production and shortness of breath.   Cardiovascular:  Positive for chest pain.  Gastrointestinal: Negative.   Genitourinary: Negative.   Musculoskeletal: Negative.   Skin: Negative.   Neurological: Negative.   Endo/Heme/Allergies: Negative.   Psychiatric/Behavioral: Negative.    All other systems reviewed and are negative.     All other systems are reviewed and negative.    PHYSICAL EXAM: VS:  BP 130/80   Pulse 84   Ht 5' 3"$  (1.6 m)   Wt 148 lb 6.4 oz (67.3 kg)   SpO2 94%   BMI 26.29 kg/m  , BMI Body mass index is 26.29 kg/m. Last weight:  Wt Readings from Last 3 Encounters:  03/05/22 148 lb 6.4 oz (67.3 kg)  06/15/20 138 lb 14.2 oz (63 kg)  04/30/20 137 lb 9.6 oz (62.4 kg)     Physical Exam Constitutional:      Appearance: Normal appearance.  Cardiovascular:     Rate and Rhythm: Normal rate and regular rhythm.     Heart sounds: Normal heart sounds.  Pulmonary:     Effort: Pulmonary effort is normal.     Breath sounds: Normal breath sounds.  Musculoskeletal:     Right lower leg: No  edema.     Left lower leg: No edema.  Neurological:     Mental Status: She is alert.       EKG:   Recent Labs: 09/05/2021: ALT 13; BUN 16;  Creatinine, Ser 0.76; Hemoglobin 11.3; Platelets 188; Potassium 4.3; Sodium 141    Lipid Panel No results found for: "CHOL", "TRIG", "HDL", "CHOLHDL", "VLDL", "LDLCALC", "LDLDIRECT"    REASON FOR VISIT  Referred by Dr.Odeal Welden Humphrey Rolls.        TESTS  Imaging: Computed Tomographic Angiography:  Cardiac multidetector CT was performed paying particular attention to the coronary arteries for the diagnosis of: Chest Pain. Diagnostic Drugs:  Administered iohexol (Omnipaque) through an antecubital vein and images from the examination were analyzed for the presence and extent of coronary artery disease, using 3D image processing software. 100 mL of non-ionic contrast (Omnipaque) was used.        TEST CONCLUSIONS  Quality of study: Fair  1- Calcium score: 407.1  2- Right dominant system.  3- RCA is normal, LAD is normal. LCX mid portion calcified with 30-40% disease.      Neoma Laming MD  Electronically signed by: Neoma Laming     Date: 05/07/2021 14:57 Other studies Reviewed: REASON FOR VISIT  Referred by Dr.Aleicia Kenagy Humphrey Rolls.        TESTS  Imaging: Computed Tomographic Angiography:  Cardiac multidetector CT was performed paying particular attention to the coronary arteries for the diagnosis of: Chest Pain. Diagnostic Drugs:  Administered iohexol (Omnipaque) through an antecubital vein and images from the examination were analyzed for the presence and extent of coronary artery disease, using 3D image processing software. 100 mL of non-ionic contrast (Omnipaque) was used.        TEST CONCLUSIONS  Quality of study: Fair  1- Calcium score: 407.1  2- Right dominant system.  3- RCA is normal, LAD is normal. LCX mid portion calcified with 30-40% disease.      Neoma Laming MD  Electronically signed by: Neoma Laming     Date:  05/07/2021 14:57 Additional studies/ records that were reviewed today include:  REASON FOR VISIT  Visit for: Echocardiogram/I34.0  Sex:   Female   wt= 132   lbs.  BP=121/72  Height= 63   inches.        TESTS  Imaging: Echocardiogram:  An echocardiogram in (2-d) mode was performed and in Doppler mode with color flow velocity mapping was performed. The aortic valve cusps are abnormal 1.3   cm. Mitral valve diastolic peak flow velocity E .731   m/s and E/A ratio 0.9. Aortic root diameter 2.6  cm. The LVOT internal diameter 2.6  cm. LV systolic dimension [id number]   cm, diastolic [id number] cm, posterior wall thickness 1.05   cm, fractional shortening 39  %, and EF 70.5 %. IVS thickness 1.35   cm. LA dimension 4.1 cm. Mitral Valve has Mild Regurgitation. Pulmonic Valve has Trace Regurgitation. Tricuspid Valve has Mild Regurgitation.     ASSESSMENT  Technically adequate study.  Normal left ventricular systolic function.  Mild left ventricular hypertrophy with GRADE 1 (relaxation abnormality) diastolic dysfunction.  Normal right ventricular systolic function.  Normal right ventricular diastolic function.  Normal left ventricular wall motion.  Normal right ventricular wall motion.  Trace pulmonary regurgitation.  Mild tricuspid regurgitation.  Mild pulmonary hypertension.  Mild mitral regurgitation.  No pericardial effusion.  Moderately dilated Left atium  Mildly dilated Right atrium  Midlly dilated Left ventricle  Mild LVH.     THERAPY   Referring physician: Dionisio David  Sonographer: Neoma Laming.   Neoma Laming MD  Electronically signed by: Neoma Laming     Date: 11/13/2021 13:52 Review of the above records demonstrates:  No data to display         ASSESSMENT AND PLAN:    ICD-10-CM   1. Systolic congestive heart failure, unspecified HF chronicity ([place])  I50.20 DG Chest 2 View    2. Primary hypertension  I10 DG Chest 2 View    3. Nicotine dependence,  uncomplicated, unspecified nicotine product type  F17.200 DG Chest 2 View    4. Diabetes mellitus without complication ([place])  [id number] DG Chest 2 View    5. COPD exacerbation ([place])  J44.1 DG Chest 2 View       Problem List Items Addressed This Visit       Cardiovascular and Mediastinum   Hypertension   Relevant Medications   furosemide (LASIX) 40 MG tablet   Other Relevant Orders   DG Chest 2 View   Congestive heart failure (HCC) - Primary    SOB with cough with gret sputum, will do cxr and place on levaquin and increase diuretics      Relevant Medications   furosemide (LASIX) 40 MG tablet   Other Relevant Orders   DG Chest 2 View     Respiratory   COPD exacerbation ([place])   Relevant Orders   DG Chest 2 View     Endocrine   Diabetes mellitus without complication ([place])   Relevant Orders   DG Chest 2 View     Other   Nicotine dependence   Relevant Orders   DG Chest 2 View       Disposition:   Return in about 1 week (around 03/12/2022) for cxr and f/u 1 week.    Total t  Signed,  Neoma Laming, MD  03/05/2022 11:51 AM    [facility]

## 2022-03-08 ENCOUNTER — Telehealth: Payer: Self-pay

## 2022-03-08 NOTE — Telephone Encounter (Signed)
Patient has called about her xray results asking for call back

## 2022-03-12 ENCOUNTER — Encounter: Payer: Self-pay | Admitting: Cardiovascular Disease

## 2022-03-12 ENCOUNTER — Ambulatory Visit (INDEPENDENT_AMBULATORY_CARE_PROVIDER_SITE_OTHER): Payer: 59 | Admitting: Cardiovascular Disease

## 2022-03-12 VITALS — BP 132/62 | HR 77 | Ht 63.0 in | Wt 145.0 lb

## 2022-03-12 DIAGNOSIS — I10 Essential (primary) hypertension: Secondary | ICD-10-CM

## 2022-03-12 DIAGNOSIS — I5020 Unspecified systolic (congestive) heart failure: Secondary | ICD-10-CM

## 2022-03-12 DIAGNOSIS — R21 Rash and other nonspecific skin eruption: Secondary | ICD-10-CM | POA: Diagnosis not present

## 2022-03-12 MED ORDER — FUROSEMIDE 40 MG PO TABS: 40.0000 mg | ORAL_TABLET | Freq: Two times a day (BID) | ORAL | 11 refills | Status: DC

## 2022-03-12 MED ORDER — NYSTATIN 100000 UNIT/ML MT SUSP: 5.0000 mL | Freq: Four times a day (QID) | OROMUCOSAL | 0 refills | Status: DC

## 2022-03-12 NOTE — Progress Notes (Signed)
Cardiology Office Note   Date:  03/12/2022   ID:  Miyeko, Caufield 1955/03/29, MRN [id number]  PCP:  [facility], [place], NP  Cardiologist:  Neoma Laming, MD      History of Present Illness: Angela Daniel is a 67 y.o. female who presents for  Chief Complaint  Patient presents with   Follow-up    1 week xray results    Patient in office to discuss chest xray results. Reports no improvement in shortness of breath. Complains of redness under both breasts.   Shortness of Breath This is a chronic problem. The current episode started more than 1 year ago. The problem has been waxing and waning. Associated symptoms include leg swelling. The symptoms are aggravated by any activity. She has tried rest for the symptoms. The treatment provided no relief. Her past medical history is significant for CAD, chronic lung disease, COPD and a heart failure.      Past Medical History:  Diagnosis Date   Congestive heart failure (HCC)    COPD (chronic obstructive pulmonary disease) ([place])    Degenerative joint disease    Diabetes mellitus without complication (HCC)    Fibromyalgia    GERD (gastroesophageal reflux disease)    Hypertension    Scoliosis    Sleep apnea      Past Surgical History:  Procedure Laterality Date   ABDOMINAL HYSTERECTOMY     APPENDECTOMY     CESAREAN SECTION     x3   CHOLECYSTECTOMY     HIP SURGERY Left    joint replacement   KYPHOPLASTY N/A 05/18/2019   Procedure: T6 KYPHOPLASTY;  Surgeon: Hessie Knows, MD;  Location: [facility] ORS;  Service: Orthopedics;  Laterality: N/A;     Current Outpatient Medications  Medication Sig Dispense Refill   ALPRAZolam (XANAX) 1 MG tablet Take 1 tablet (1 mg total) by mouth 3 (three) times daily. 30 tablet 0   amLODipine (NORVASC) 2.5 MG tablet Take 2.5 mg by mouth daily.     amphetamine-dextroamphetamine (ADDERALL) 10 MG tablet Take 1 tablet (10 mg total) by mouth daily with breakfast. 30 tablet 0   aspirin EC  81 MG tablet Take 81 mg by mouth daily.     blood glucose meter kit and supplies KIT Dispense based on patient and insurance preference. Use up to four times daily as directed. Check blood sugar three times a day 1 each 0   buprenorphine (SUBUTEX) 8 MG SUBL SL tablet Place 8 mg under the tongue 3 (three) times daily.     Cholecalciferol (VITAMIN D3) 50 MCG (2000 UT) TABS Take 2,000 mg by mouth daily.     Cyanocobalamin 2500 MCG CHEW Chew 2,500 mcg by mouth daily.     diclofenac (VOLTAREN) 50 MG EC tablet Take 50 mg by mouth 2 (two) times daily as needed for mild pain.     esomeprazole (NEXIUM) 40 MG capsule Take 40 mg by mouth daily.     Fluticasone-Salmeterol (ADVAIR) 250-50 MCG/DOSE AEPB Inhale 1 puff into the lungs 2 (two) times daily.     isosorbide mononitrate (IMDUR) 30 MG 24 hr tablet Take 30 mg by mouth daily.     levETIRAcetam (KEPPRA) 500 MG tablet Take 1 tablet (500 mg total) by mouth 2 (two) times daily. 60 tablet 3   levofloxacin (LEVAQUIN) 500 MG tablet Take 1 tablet (500 mg total) by mouth daily for 10 days. 10 tablet 0   levofloxacin (LEVAQUIN) 500 MG tablet Take 1  tablet (500 mg total) by mouth daily for 10 days. 10 tablet 0   lisinopril (ZESTRIL) 10 MG tablet Take 10 mg by mouth daily.     metFORMIN (GLUCOPHAGE) 500 MG tablet Take 500 mg by mouth 2 (two) times daily with a meal.      metoprolol succinate (TOPROL-XL) 50 MG 24 hr tablet Take 50 mg by mouth daily.     NARCAN 4 MG/0.1ML LIQD nasal spray kit Place 1 spray into the nose as directed.     nystatin (MYCOSTATIN) 100000 UNIT/ML suspension Take 5 mLs (500,000 Units total) by mouth 4 (four) times daily. 60 mL 0   Omega-3 Fatty Acids (FISH OIL) 500 MG CAPS Take 1 capsule by mouth 1 day or 1 dose.     pantoprazole (PROTONIX) 20 MG tablet Take 20 mg by mouth daily.     polyethylene glycol (MIRALAX / GLYCOLAX) 17 g packet Take 17 g by mouth daily. 14 each 0   rosuvastatin (CRESTOR) 20 MG tablet Take 20 mg by mouth daily.      Vitamin E 400 units TABS Take 400 Units by mouth daily.     fluticasone (FLONASE) 50 MCG/ACT nasal spray Place 1 spray into both nostrils at bedtime. (Patient not taking: Reported on 03/05/2022)     furosemide (LASIX) 40 MG tablet Take 1 tablet (40 mg total) by mouth 2 (two) times daily. 60 tablet 11   levofloxacin (LEVAQUIN) 500 MG tablet Take 1 tablet (500 mg total) by mouth daily for 10 days. (Patient not taking: Reported on 03/12/2022) 10 tablet 0   magnesium oxide (MAG-OX) 400 (240 Mg) MG tablet Take 1 tablet (400 mg total) by mouth 2 (two) times daily. (Patient not taking: Reported on 03/05/2022) 30 tablet 0   senna-docusate (SENOKOT-S) 8.6-50 MG tablet Take 1 tablet by mouth 2 (two) times daily. (Patient not taking: Reported on 03/05/2022) 30 tablet 0   zinc gluconate 50 MG tablet Take 50 mg by mouth daily. (Patient not taking: Reported on 03/05/2022)     No current facility-administered medications for this visit.    Allergies:   Tramadol, Tylenol [acetaminophen], and Vicodin [hydrocodone-acetaminophen]    Social History:   reports that she has been smoking cigarettes. She has been smoking an average of .5 packs per day. She has never used smokeless tobacco. She reports that she does not drink alcohol and does not use drugs.   Family History:  family history includes Asthma in her father; CVA in her mother; Diabetes in her mother; Heart attack in her father; Hypertension in her father; Lung cancer in her father and mother.    ROS:     Review of Systems  Constitutional: Negative.   HENT: Negative.    Eyes: Negative.   Respiratory:  Positive for shortness of breath.   Cardiovascular:  Positive for leg swelling.  Gastrointestinal: Negative.   Genitourinary: Negative.   Musculoskeletal: Negative.   Skin: Negative.   Neurological: Negative.   Endo/Heme/Allergies: Negative.   Psychiatric/Behavioral: Negative.    All other systems reviewed and are negative.     All other systems are  reviewed and negative.    PHYSICAL EXAM: VS:  BP 132/62   Pulse 77   Ht 5' 3"$  (1.6 m)   Wt 145 lb (65.8 kg)   SpO2 93%   BMI 25.69 kg/m  , BMI Body mass index is 25.69 kg/m. Last weight:  Wt Readings from Last 3 Encounters:  03/12/22 145 lb (65.8 kg)  03/05/22 148  lb 6.4 oz (67.3 kg)  06/15/20 138 lb 14.2 oz (63 kg)     Physical Exam Constitutional:      Appearance: Normal appearance.  Cardiovascular:     Rate and Rhythm: Normal rate and regular rhythm.     Heart sounds: Normal heart sounds.  Pulmonary:     Effort: Pulmonary effort is normal.     Breath sounds: Normal breath sounds.  Chest:  Breasts:    Right: Skin change present.     Left: Skin change present.    Musculoskeletal:     Right lower leg: No edema.     Left lower leg: No edema.  Neurological:     Mental Status: She is alert.     EKG: none today  Recent Labs: 09/05/2021: ALT 13; BUN 16; Creatinine, Ser 0.76; Hemoglobin 11.3; Platelets 188; Potassium 4.3; Sodium 141    Lipid Panel No results found for: "CHOL", "TRIG", "HDL", "CHOLHDL", "VLDL", "LDLCALC", "LDLDIRECT"    Other studies Reviewed: chest xray   ASSESSMENT AND PLAN:    ICD-10-CM   1. Systolic congestive heart failure, unspecified HF chronicity (HCC)  I50.20 furosemide (LASIX) 40 MG tablet    Basic metabolic panel    2. Rash  R21 nystatin (MYCOSTATIN) 100000 UNIT/ML suspension    3. Primary hypertension  I10        Problem List Items Addressed This Visit       Cardiovascular and Mediastinum   Hypertension   Relevant Medications   furosemide (LASIX) 40 MG tablet   Congestive heart failure (HCC) - Primary    Labs today. Increase furosemide to twice daily.       Relevant Medications   furosemide (LASIX) 40 MG tablet   Other Relevant Orders   Basic metabolic panel     Musculoskeletal and Integument   Rash    Nystatin sent to pharmacy. Follow up with PCP.       Relevant Medications   nystatin (MYCOSTATIN) 100000  UNIT/ML suspension       Disposition:   Return in about 1 week (around 03/19/2022).    Total time spent: 30 minutes  Signed,  Neoma Laming, MD  03/12/2022 1:13 [place]

## 2022-03-12 NOTE — Assessment & Plan Note (Signed)
Nystatin sent to pharmacy. Follow up with PCP.

## 2022-03-12 NOTE — Assessment & Plan Note (Signed)
Labs today. Increase furosemide to twice daily.

## 2022-03-12 NOTE — Patient Instructions (Signed)
Take 40 mg Lasix twice daily

## 2022-03-13 LAB — BASIC METABOLIC PANEL
BUN/Creatinine Ratio: 17 (ref 12–28)
BUN: 13 mg/dL (ref 8–27)
CO2: 29 mmol/L (ref 20–29)
Calcium: 9.6 mg/dL (ref 8.7–10.3)
Chloride: 97 mmol/L (ref 96–106)
Creatinine, Ser: 0.78 mg/dL (ref 0.57–1.00)
Glucose: 99 mg/dL (ref 70–99)
Potassium: 5.1 mmol/L (ref 3.5–5.2)
Sodium: 139 mmol/L (ref 134–144)
eGFR: 83 mL/min/{1.73_m2} (ref >59)

## 2022-03-14 ENCOUNTER — Ambulatory Visit: Payer: 59 | Admitting: Cardiovascular Disease

## 2022-03-19 ENCOUNTER — Ambulatory Visit: Payer: 59 | Admitting: Cardiovascular Disease

## 2022-03-21 ENCOUNTER — Ambulatory Visit (INDEPENDENT_AMBULATORY_CARE_PROVIDER_SITE_OTHER): Payer: 59 | Admitting: Cardiovascular Disease

## 2022-03-21 ENCOUNTER — Encounter: Payer: Self-pay | Admitting: Cardiovascular Disease

## 2022-03-21 VITALS — BP 140/72 | HR 96 | Resp 18 | Ht 63.0 in | Wt 145.0 lb

## 2022-03-21 DIAGNOSIS — I5020 Unspecified systolic (congestive) heart failure: Secondary | ICD-10-CM

## 2022-03-21 DIAGNOSIS — R0602 Shortness of breath: Secondary | ICD-10-CM

## 2022-03-21 DIAGNOSIS — E782 Mixed hyperlipidemia: Secondary | ICD-10-CM

## 2022-03-21 DIAGNOSIS — I10 Essential (primary) hypertension: Secondary | ICD-10-CM | POA: Diagnosis not present

## 2022-03-21 DIAGNOSIS — F17200 Nicotine dependence, unspecified, uncomplicated: Secondary | ICD-10-CM

## 2022-03-21 MED ORDER — SPIRONOLACTONE 25 MG PO TABS: 12.5000 mg | ORAL_TABLET | Freq: Every day | ORAL | 3 refills | Status: DC

## 2022-03-21 NOTE — Progress Notes (Signed)
Cardiology Office Note   Date:  03/21/2022   ID:  Sarajo, Dunkerson 1955-08-26, MRN [id number]  PCP:  [facility], [place], NP  Cardiologist:  Neoma Laming, MD      History of Present Illness: Angela Daniel is a 67 y.o. female who presents for  Chief Complaint  Patient presents with   Follow-up    1 week  follow up    Patient in office for one week follow up. Patient continues to have shortness of breath, coughing, productive cough with clear mucous. Denies chest pain, edema.      Past Medical History:  Diagnosis Date   Congestive heart failure (HCC)    COPD (chronic obstructive pulmonary disease) ([place])    Degenerative joint disease    Diabetes mellitus without complication (HCC)    Fibromyalgia    GERD (gastroesophageal reflux disease)    Hypertension    Scoliosis    Sleep apnea      Past Surgical History:  Procedure Laterality Date   ABDOMINAL HYSTERECTOMY     APPENDECTOMY     CESAREAN SECTION     x3   CHOLECYSTECTOMY     HIP SURGERY Left    joint replacement   KYPHOPLASTY N/A 05/18/2019   Procedure: T6 KYPHOPLASTY;  Surgeon: Hessie Knows, MD;  Location: [facility] ORS;  Service: Orthopedics;  Laterality: N/A;     Current Outpatient Medications  Medication Sig Dispense Refill   albuterol (VENTOLIN HFA) 108 (90 Base) MCG/ACT inhaler Inhale 2 puffs into the lungs every 4 (four) hours as needed.     ALPRAZolam (XANAX) 1 MG tablet Take 1 tablet (1 mg total) by mouth 3 (three) times daily. 30 tablet 0   amLODipine (NORVASC) 2.5 MG tablet Take 2.5 mg by mouth daily.     amphetamine-dextroamphetamine (ADDERALL) 10 MG tablet Take 1 tablet (10 mg total) by mouth daily with breakfast. 30 tablet 0   ARIPiprazole (ABILIFY) 15 MG tablet Take 15 mg by mouth once.     aspirin EC 81 MG tablet Take 81 mg by mouth daily.     buprenorphine (SUBUTEX) 8 MG SUBL SL tablet Place 8 mg under the tongue 3 (three) times daily.     Cholecalciferol (VITAMIN D3) 50 MCG (2000  UT) TABS Take 2,000 mg by mouth daily.     Cyanocobalamin 2500 MCG CHEW Chew 2,500 mcg by mouth daily.     diclofenac (VOLTAREN) 50 MG EC tablet Take 50 mg by mouth 2 (two) times daily as needed for mild pain.     esomeprazole (NEXIUM) 40 MG capsule Take 40 mg by mouth daily.     fluticasone (FLONASE) 50 MCG/ACT nasal spray Place 1 spray into both nostrils at bedtime.     Fluticasone-Salmeterol (ADVAIR) 250-50 MCG/DOSE AEPB Inhale 1 puff into the lungs 2 (two) times daily.     furosemide (LASIX) 40 MG tablet Take 1 tablet (40 mg total) by mouth 2 (two) times daily. 60 tablet 11   isosorbide mononitrate (IMDUR) 30 MG 24 hr tablet Take 30 mg by mouth daily.     levETIRAcetam (KEPPRA) 500 MG tablet Take 1 tablet (500 mg total) by mouth 2 (two) times daily. 60 tablet 3   lisinopril (ZESTRIL) 10 MG tablet Take 10 mg by mouth daily.     metFORMIN (GLUCOPHAGE) 500 MG tablet Take 500 mg by mouth 2 (two) times daily with a meal.      metoprolol succinate (TOPROL-XL) 50 MG 24 hr tablet  Take 50 mg by mouth daily.     NARCAN 4 MG/0.1ML LIQD nasal spray kit Place 1 spray into the nose as directed.     nystatin (MYCOSTATIN) 100000 UNIT/ML suspension Take 5 mLs (500,000 Units total) by mouth 4 (four) times daily. 60 mL 0   Omega-3 Fatty Acids (FISH OIL) 500 MG CAPS Take 1 capsule by mouth 1 day or 1 dose.     ondansetron (ZOFRAN-ODT) 4 MG disintegrating tablet Take 4 mg by mouth every 6 (six) hours as needed.     rosuvastatin (CRESTOR) 20 MG tablet Take 20 mg by mouth daily.     spironolactone (ALDACTONE) 25 MG tablet Take 0.5 tablets (12.5 mg total) by mouth daily. 30 tablet 3   Vitamin E 400 units TABS Take 400 Units by mouth daily.     zinc gluconate 50 MG tablet Take 50 mg by mouth daily.     blood glucose meter kit and supplies KIT Dispense based on patient and insurance preference. Use up to four times daily as directed. Check blood sugar three times a day 1 each 0   magnesium oxide (MAG-OX) 400 (240  Mg) MG tablet Take 1 tablet (400 mg total) by mouth 2 (two) times daily. (Patient not taking: Reported on 03/05/2022) 30 tablet 0   pantoprazole (PROTONIX) 20 MG tablet Take 20 mg by mouth daily. (Patient not taking: Reported on 03/21/2022)     polyethylene glycol (MIRALAX / GLYCOLAX) 17 g packet Take 17 g by mouth daily. (Patient not taking: Reported on 03/21/2022) 14 each 0   senna-docusate (SENOKOT-S) 8.6-50 MG tablet Take 1 tablet by mouth 2 (two) times daily. (Patient not taking: Reported on 03/05/2022) 30 tablet 0   No current facility-administered medications for this visit.    Allergies:   Tramadol, Tylenol [acetaminophen], and Vicodin [hydrocodone-acetaminophen]    Social History:   reports that she has been smoking cigarettes. She has been smoking an average of .5 packs per day. She has never used smokeless tobacco. She reports that she does not drink alcohol and does not use drugs.   Family History:  family history includes Asthma in her father; CVA in her mother; Diabetes in her mother; Heart attack in her father; Hypertension in her father; Lung cancer in her father and mother.    ROS:     Review of Systems  Constitutional: Negative.   HENT: Negative.    Eyes: Negative.   Respiratory:  Positive for cough, sputum production and shortness of breath.   Gastrointestinal: Negative.   Genitourinary: Negative.   Musculoskeletal: Negative.   Skin: Negative.   Neurological: Negative.   Endo/Heme/Allergies: Negative.   Psychiatric/Behavioral: Negative.    All other systems reviewed and are negative.   All other systems are reviewed and negative.   PHYSICAL EXAM: VS:  BP (!) 140/72   Pulse 96   Resp 18   Ht '5\' 3"'$  (1.6 m)   Wt 145 lb (65.8 kg)   BMI 25.69 kg/m  , BMI Body mass index is 25.69 kg/m. Last weight:  Wt Readings from Last 3 Encounters:  03/21/22 145 lb (65.8 kg)  03/12/22 145 lb (65.8 kg)  03/05/22 148 lb 6.4 oz (67.3 kg)    Physical Exam Constitutional:       Appearance: Normal appearance.  Cardiovascular:     Rate and Rhythm: Normal rate and regular rhythm.     Heart sounds: Normal heart sounds.  Pulmonary:     Effort: Pulmonary effort is normal.  Breath sounds: Normal breath sounds.  Musculoskeletal:     Right lower leg: No edema.     Left lower leg: No edema.  Neurological:     Mental Status: She is alert.     EKG: none today  Recent Labs: 09/05/2021: ALT 13; Hemoglobin 11.3; Platelets 188 03/12/2022: BUN 13; Creatinine, Ser 0.78; Potassium 5.1; Sodium 139    Lipid Panel No results found for: "CHOL", "TRIG", "HDL", "CHOLHDL", "VLDL", "LDLCALC", "LDLDIRECT"   ASSESSMENT AND PLAN:    ICD-10-CM   1. SOB (shortness of breath)  R06.02 spironolactone (ALDACTONE) 25 MG tablet    2. Systolic congestive heart failure, unspecified HF chronicity (HCC)  I50.20     3. Primary hypertension  I10     4. Mixed hyperlipidemia  E78.2     5. Nicotine dependence, uncomplicated, unspecified nicotine product type  F17.200        Problem List Items Addressed This Visit       Cardiovascular and Mediastinum   Hypertension   Relevant Medications   spironolactone (ALDACTONE) 25 MG tablet   Congestive heart failure (HCC)   Relevant Medications   spironolactone (ALDACTONE) 25 MG tablet     Other   HLD (hyperlipidemia)   Relevant Medications   spironolactone (ALDACTONE) 25 MG tablet   Nicotine dependence   Other Visit Diagnoses     SOB (shortness of breath)    -  Primary   Relevant Medications   spironolactone (ALDACTONE) 25 MG tablet        Disposition:   Return in about 4 weeks (around 04/18/2022) for needs scheduled with [facility] tomestablish care.    Total time spent: 30 minutes  Signed,  Neoma Laming, MD  03/21/2022 12:02 [place]

## 2022-03-21 NOTE — Assessment & Plan Note (Signed)
Patient continues to have shortness of breath. Has not established with PCP. Add spironolactone for DD.

## 2022-03-26 ENCOUNTER — Encounter: Payer: Self-pay | Admitting: Internal Medicine

## 2022-03-26 ENCOUNTER — Ambulatory Visit (INDEPENDENT_AMBULATORY_CARE_PROVIDER_SITE_OTHER): Payer: 59 | Admitting: Internal Medicine

## 2022-03-26 VITALS — BP 124/74 | HR 81 | Ht 63.0 in | Wt 147.2 lb

## 2022-03-26 DIAGNOSIS — G40909 Epilepsy, unspecified, not intractable, without status epilepticus: Secondary | ICD-10-CM

## 2022-03-26 DIAGNOSIS — Z1231 Encounter for screening mammogram for malignant neoplasm of breast: Secondary | ICD-10-CM | POA: Diagnosis not present

## 2022-03-26 DIAGNOSIS — J449 Chronic obstructive pulmonary disease, unspecified: Secondary | ICD-10-CM

## 2022-03-26 DIAGNOSIS — Z13820 Encounter for screening for osteoporosis: Secondary | ICD-10-CM

## 2022-03-26 DIAGNOSIS — I5040 Unspecified combined systolic (congestive) and diastolic (congestive) heart failure: Secondary | ICD-10-CM

## 2022-03-26 DIAGNOSIS — F418 Other specified anxiety disorders: Secondary | ICD-10-CM

## 2022-03-26 DIAGNOSIS — F17210 Nicotine dependence, cigarettes, uncomplicated: Secondary | ICD-10-CM

## 2022-03-26 DIAGNOSIS — E119 Type 2 diabetes mellitus without complications: Secondary | ICD-10-CM

## 2022-03-26 DIAGNOSIS — Z1211 Encounter for screening for malignant neoplasm of colon: Secondary | ICD-10-CM

## 2022-03-26 LAB — POCT CBG (FASTING - GLUCOSE)-MANUAL ENTRY: Glucose Fasting, POC: 122 mg/dL — AB (ref 70–99)

## 2022-03-26 MED ORDER — LEVETIRACETAM 500 MG PO TABS: 500.0000 mg | ORAL_TABLET | Freq: Two times a day (BID) | ORAL | 3 refills | Status: DC

## 2022-03-26 NOTE — Progress Notes (Signed)
Established Patient Office Visit  Subjective:  Patient ID: Angela Daniel, female    DOB: 1955/12/11  Age: 67 y.o. MRN: [id number]  Chief Complaint  Patient presents with   [place] Patient     Patient comes in to establish with  Primary care department at this office.  Patient is already under the care of cardiology here. Patient reports that her previous internist has moved and it is not possible for her to travel that much. Patient is also under the care of of psychiatrist, pulmonologist, and neurologist. She has a history of seizures but admits that she has not seen her neurologist for a while.  Her Keppra will be refilled today but a  new referral is being sent to the neurologist. Patient admits that she has not had a mammogram in several years, and never got a colon cancer screening or DEXA scan.  Patient is status post total hysterectomy in 1982 for heavy bleeding. Today patient complains of her chronic mild shortness of breath.  She has multiple aches or pains in her joints.  She also gets intermittent chest pains. She agrees to get her mammogram and DEXA scan scheduled we will also set her up for Cologuard.     Past Medical History:  Diagnosis Date   Congestive heart failure (HCC)    COPD (chronic obstructive pulmonary disease) ([place])    Degenerative joint disease    Diabetes mellitus without complication (HCC)    Fibromyalgia    GERD (gastroesophageal reflux disease)    Hypertension    Scoliosis    Sleep apnea     Past Surgical History:  Procedure Laterality Date   ABDOMINAL HYSTERECTOMY     APPENDECTOMY     CESAREAN SECTION     x3   CHOLECYSTECTOMY     HIP SURGERY Left    joint replacement   KYPHOPLASTY N/A 05/18/2019   Procedure: T6 KYPHOPLASTY;  Surgeon: Hessie Knows, MD;  Location: [facility] ORS;  Service: Orthopedics;  Laterality: N/A;    Social History   Socioeconomic History   Marital status: Single    Spouse name: Not on file   Number of  children: Not on file   Years of education: Not on file   Highest education level: Not on file  Occupational History   Not on file  Tobacco Use   Smoking status: Every Day    Packs/day: 0.50    Types: Cigarettes   Smokeless tobacco: Never  Vaping Use   Vaping Use: Never used  Substance and Sexual Activity   Alcohol use: No   Drug use: No   Sexual activity: Not on file  Other Topics Concern   Not on file  Social History Narrative   Not on file   Social Determinants of Health   Financial Resource Strain: Not on file  Food Insecurity: Not on file  Transportation Needs: Not on file  Physical Activity: Not on file  Stress: Not on file  Social Connections: Not on file  Intimate Partner Violence: Not on file    Family History  Problem Relation Age of Onset   CVA Mother    Lung cancer Mother    Diabetes Mother    Heart attack Father    Hypertension Father    Lung cancer Father    Asthma Father    Breast cancer Neg Hx     Allergies  Allergen Reactions   Tramadol Hives   Tylenol [Acetaminophen] Nausea And Vomiting  Vicodin [Hydrocodone-Acetaminophen] Hives    Review of Systems  Constitutional:  Positive for malaise/fatigue. Negative for chills, diaphoresis, fever and weight loss.  HENT:  Positive for hearing loss. Negative for ear discharge, ear pain, sinus pain and tinnitus.   Eyes:  Negative for double vision, photophobia, pain and discharge.  Respiratory:  Positive for cough and shortness of breath. Negative for wheezing and stridor.   Cardiovascular:  Positive for chest pain, palpitations and orthopnea. Negative for leg swelling and PND.  Gastrointestinal: Negative.  Negative for abdominal pain, heartburn and nausea.  Genitourinary: Negative.   Musculoskeletal:  Positive for joint pain and myalgias.  Skin: Negative.   Neurological:  Positive for seizures. Negative for dizziness, tingling and headaches.  Psychiatric/Behavioral:  Negative for depression and  hallucinations. The patient is not nervous/anxious and does not have insomnia.        Objective:   BP 124/74   Pulse 81   Ht '5\' 3"'$  (1.6 m)   Wt 147 lb 3.2 oz (66.8 kg)   SpO2 97%   BMI 26.08 kg/m   Vitals:   03/26/22 1133  BP: 124/74  Pulse: 81  Height: '5\' 3"'$  (1.6 m)  Weight: 147 lb 3.2 oz (66.8 kg)  SpO2: 97%  BMI (Calculated): 26.08    Physical Exam Vitals and nursing note reviewed.  Constitutional:      Appearance: Normal appearance.  Cardiovascular:     Rate and Rhythm: Normal rate and regular rhythm.  Pulmonary:     Effort: Pulmonary effort is normal.     Breath sounds: Normal breath sounds.  Abdominal:     General: Abdomen is flat.     Palpations: Abdomen is soft.  Musculoskeletal:        General: Normal range of motion.     Cervical back: Normal range of motion and neck supple.  Neurological:     General: No focal deficit present.     Mental Status: She is alert and oriented to person, place, and time.  Psychiatric:        Mood and Affect: Mood normal.        Behavior: Behavior normal.      Results for orders placed or performed in visit on 03/26/22  POCT CBG (Fasting - Glucose)  Result Value Ref Range   Glucose Fasting, POC 122 (A) 70 - 99 mg/dL    Recent Results (from the past 2160 hour(s))  Basic metabolic panel     Status: None   Collection Time: 03/12/22 12:21 PM  Result Value Ref Range   Glucose 99 70 - 99 mg/dL   BUN 13 8 - 27 mg/dL   Creatinine, Ser 0.78 0.57 - 1.00 mg/dL   eGFR 83 >59 mL/min/1.73   BUN/Creatinine Ratio 17 12 - 28   Sodium 139 134 - 144 mmol/L   Potassium 5.1 3.5 - 5.2 mmol/L   Chloride 97 96 - 106 mmol/L   CO2 29 20 - 29 mmol/L   Calcium 9.6 8.7 - 10.3 mg/dL  POCT CBG (Fasting - Glucose)     Status: Abnormal   Collection Time: 03/26/22 11:39 AM  Result Value Ref Range   Glucose Fasting, POC 122 (A) 70 - 99 mg/dL      Assessment & Plan:   Problem List Items Addressed This Visit     Diabetes mellitus  without complication ([place]) - Primary   Relevant Orders   POCT CBG (Fasting - Glucose) (Completed)   Other Visit Diagnoses  Screening for osteoporosis       Relevant Orders   DG Bone Density   Encounter for screening mammogram for malignant neoplasm of breast       Relevant Orders   MM 3D SCREENING MAMMOGRAM BILATERAL BREAST   Seizure disorder ([place])       Relevant Medications   levETIRAcetam (KEPPRA) 500 MG tablet   Other Relevant Orders   Ambulatory referral to Neurology       Return in about 4 weeks (around 04/23/2022).   Total time spent: 45 minutes  Perrin Maltese, MD  03/26/2022

## 2022-03-28 ENCOUNTER — Other Ambulatory Visit: Payer: Self-pay | Admitting: Cardiovascular Disease

## 2022-03-28 DIAGNOSIS — I10 Essential (primary) hypertension: Secondary | ICD-10-CM

## 2022-04-18 ENCOUNTER — Ambulatory Visit: Payer: 59 | Admitting: Cardiovascular Disease

## 2022-04-22 ENCOUNTER — Ambulatory Visit (INDEPENDENT_AMBULATORY_CARE_PROVIDER_SITE_OTHER): Payer: Medicare HMO | Admitting: Cardiovascular Disease

## 2022-04-22 ENCOUNTER — Encounter: Payer: Self-pay | Admitting: Cardiovascular Disease

## 2022-04-22 VITALS — BP 118/68 | HR 81 | Ht 63.0 in | Wt 149.6 lb

## 2022-04-22 DIAGNOSIS — R0602 Shortness of breath: Secondary | ICD-10-CM | POA: Diagnosis not present

## 2022-04-22 DIAGNOSIS — I5040 Unspecified combined systolic (congestive) and diastolic (congestive) heart failure: Secondary | ICD-10-CM

## 2022-04-22 DIAGNOSIS — I10 Essential (primary) hypertension: Secondary | ICD-10-CM | POA: Diagnosis not present

## 2022-04-22 DIAGNOSIS — E782 Mixed hyperlipidemia: Secondary | ICD-10-CM

## 2022-04-22 NOTE — Assessment & Plan Note (Signed)
Patient doing well. Denies chest pain. Shortness of breath improved.

## 2022-04-22 NOTE — Progress Notes (Signed)
Cardiology Office Note   Date:  04/22/2022   ID:  Angela Daniel, Angela Daniel 05-Dec-1955, MRN [id number]  PCP:  Perrin Maltese, MD  Cardiologist:  Neoma Laming, MD      History of Present Illness: Angela Daniel is a 67 y.o. female who presents for  Chief Complaint  Patient presents with   Follow-up    4 week follow up    Patient in office for one month follow up.     Past Medical History:  Diagnosis Date   Congestive heart failure    COPD (chronic obstructive pulmonary disease)    Degenerative joint disease    Diabetes mellitus without complication    Fibromyalgia    GERD (gastroesophageal reflux disease)    Hypertension    Scoliosis    Sleep apnea      Past Surgical History:  Procedure Laterality Date   ABDOMINAL HYSTERECTOMY     APPENDECTOMY     CESAREAN SECTION     x3   CHOLECYSTECTOMY     HIP SURGERY Left    joint replacement   KYPHOPLASTY N/A 05/18/2019   Procedure: T6 KYPHOPLASTY;  Surgeon: Hessie Knows, MD;  Location: [facility] ORS;  Service: Orthopedics;  Laterality: N/A;     Current Outpatient Medications  Medication Sig Dispense Refill   albuterol (VENTOLIN HFA) 108 (90 Base) MCG/ACT inhaler Inhale 2 puffs into the lungs every 4 (four) hours as needed.     alprazolam (XANAX) 2 MG tablet Take 2 mg by mouth 3 (three) times daily.     amLODipine (NORVASC) 2.5 MG tablet Take 2.5 mg by mouth daily.     amphetamine-dextroamphetamine (ADDERALL) 20 MG tablet Take 20 mg by mouth once a week.     amphetamine-dextroamphetamine (ADDERALL) 30 MG tablet Take 1 tablet by mouth 2 (two) times daily.     ARIPiprazole (ABILIFY) 15 MG tablet Take 15 mg by mouth once.     aspirin EC 81 MG tablet Take 81 mg by mouth daily.     blood glucose meter kit and supplies KIT Dispense based on patient and insurance preference. Use up to four times daily as directed. Check blood sugar three times a day 1 each 0   buprenorphine (SUBUTEX) 8 MG SUBL SL tablet Place 8 mg under the tongue 3  (three) times daily.     Cholecalciferol (VITAMIN D3) 50 MCG (2000 UT) TABS Take 2,000 mg by mouth daily.     Cyanocobalamin 2500 MCG CHEW Chew 2,500 mcg by mouth daily.     diclofenac (VOLTAREN) 50 MG EC tablet Take 50 mg by mouth 2 (two) times daily as needed for mild pain.     esomeprazole (NEXIUM) 40 MG capsule Take 40 mg by mouth daily.     fluticasone (FLONASE) 50 MCG/ACT nasal spray Place 1 spray into both nostrils at bedtime.     Fluticasone-Salmeterol (ADVAIR) 250-50 MCG/DOSE AEPB Inhale 1 puff into the lungs 2 (two) times daily.     furosemide (LASIX) 40 MG tablet Take 1 tablet (40 mg total) by mouth 2 (two) times daily. 60 tablet 11   isosorbide mononitrate (IMDUR) 30 MG 24 hr tablet Take 30 mg by mouth daily.     levETIRAcetam (KEPPRA) 500 MG tablet Take 1 tablet (500 mg total) by mouth 2 (two) times daily. 60 tablet 3   lisinopril (ZESTRIL) 10 MG tablet Take 10 mg by mouth daily.     magnesium oxide (MAG-OX) 400 (240 Mg) MG tablet Take  1 tablet (400 mg total) by mouth 2 (two) times daily. 30 tablet 0   metFORMIN (GLUCOPHAGE) 500 MG tablet Take 500 mg by mouth 2 (two) times daily with a meal.      metoprolol succinate (TOPROL-XL) 50 MG 24 hr tablet TAKE 1 TABLET BY MOUTH EVERY DAY 90 tablet 1   Omega-3 Fatty Acids (FISH OIL) 500 MG CAPS Take 1 capsule by mouth 1 day or 1 dose.     ondansetron (ZOFRAN-ODT) 4 MG disintegrating tablet Take 4 mg by mouth every 6 (six) hours as needed.     pantoprazole (PROTONIX) 20 MG tablet Take 20 mg by mouth daily.     rosuvastatin (CRESTOR) 20 MG tablet Take 20 mg by mouth daily.     senna-docusate (SENOKOT-S) 8.6-50 MG tablet Take 1 tablet by mouth 2 (two) times daily. 30 tablet 0   spironolactone (ALDACTONE) 25 MG tablet Take 0.5 tablets (12.5 mg total) by mouth daily. 30 tablet 3   Vitamin E 400 units TABS Take 400 Units by mouth daily.     zinc gluconate 50 MG tablet Take 50 mg by mouth daily.     ALPRAZolam (XANAX) 1 MG tablet Take 1 tablet (1  mg total) by mouth 3 (three) times daily. (Patient not taking: Reported on 04/22/2022) 30 tablet 0   NARCAN 4 MG/0.1ML LIQD nasal spray kit Place 1 spray into the nose as directed. (Patient not taking: Reported on 04/22/2022)     nystatin (MYCOSTATIN) 100000 UNIT/ML suspension Take 5 mLs (500,000 Units total) by mouth 4 (four) times daily. (Patient not taking: Reported on 04/22/2022) 60 mL 0   polyethylene glycol (MIRALAX / GLYCOLAX) 17 g packet Take 17 g by mouth daily. (Patient not taking: Reported on 03/21/2022) 14 each 0   No current facility-administered medications for this visit.    Allergies:   Tramadol, Tylenol [acetaminophen], and Vicodin [hydrocodone-acetaminophen]    Social History:   reports that she has been smoking cigarettes. She has been smoking an average of .5 packs per day. She has never used smokeless tobacco. She reports that she does not drink alcohol and does not use drugs.   Family History:  family history includes Asthma in her father; CVA in her mother; Diabetes in her mother; Heart attack in her father; Hypertension in her father; Lung cancer in her father and mother.    ROS:     Review of Systems  Constitutional: Negative.   HENT: Negative.    Eyes: Negative.   Respiratory: Negative.    Cardiovascular: Negative.   Gastrointestinal: Negative.   Genitourinary: Negative.   Musculoskeletal: Negative.   Skin: Negative.   Neurological: Negative.   Endo/Heme/Allergies: Negative.   Psychiatric/Behavioral: Negative.    All other systems reviewed and are negative.   All other systems are reviewed and negative.   PHYSICAL EXAM: VS:  BP 118/68   Pulse 81   Ht 5\' 3"  (1.6 m)   Wt 149 lb 9.6 oz (67.9 kg)   SpO2 92%   BMI 26.50 kg/m  , BMI Body mass index is 26.5 kg/m. Last weight:  Wt Readings from Last 3 Encounters:  04/22/22 149 lb 9.6 oz (67.9 kg)  03/26/22 147 lb 3.2 oz (66.8 kg)  03/21/22 145 lb (65.8 kg)    Physical Exam Constitutional:       Appearance: Normal appearance.  Cardiovascular:     Rate and Rhythm: Normal rate and regular rhythm.     Heart sounds: Normal heart sounds.  Pulmonary:     Effort: Pulmonary effort is normal.     Breath sounds: Normal breath sounds.  Musculoskeletal:     Right lower leg: No edema.     Left lower leg: No edema.  Neurological:     Mental Status: She is alert.     EKG: none today  Recent Labs: 09/05/2021: ALT 13; Hemoglobin 11.3; Platelets 188 03/12/2022: BUN 13; Creatinine, Ser 0.78; Potassium 5.1; Sodium 139    Lipid Panel No results found for: "CHOL", "TRIG", "HDL", "CHOLHDL", "VLDL", "LDLCALC", "LDLDIRECT"    Other studies Reviewed: none today   ASSESSMENT AND PLAN:    ICD-10-CM   1. SOB (shortness of breath)  R06.02     2. Combined systolic and diastolic congestive heart failure, unspecified HF chronicity  I50.40     3. Primary hypertension  I10     4. Mixed hyperlipidemia  E78.2        Problem List Items Addressed This Visit       Cardiovascular and Mediastinum   Hypertension   Congestive heart failure     Other   HLD (hyperlipidemia)   SOB (shortness of breath) - Primary    Patient doing well. Denies chest pain. Shortness of breath improved.         Disposition:   Return in about 2 months (around 06/22/2022).    Total time spent: 30 minutes  Signed,  Neoma Laming, MD  04/22/2022 11:25 [place]

## 2022-05-07 ENCOUNTER — Ambulatory Visit: Payer: Medicare HMO | Admitting: Internal Medicine

## 2022-06-13 ENCOUNTER — Other Ambulatory Visit: Payer: Self-pay | Admitting: Internal Medicine

## 2022-06-13 ENCOUNTER — Other Ambulatory Visit: Payer: Self-pay | Admitting: Cardiovascular Disease

## 2022-06-13 DIAGNOSIS — G40909 Epilepsy, unspecified, not intractable, without status epilepticus: Secondary | ICD-10-CM

## 2022-06-19 DIAGNOSIS — J449 Chronic obstructive pulmonary disease, unspecified: Secondary | ICD-10-CM | POA: Diagnosis not present

## 2022-06-24 ENCOUNTER — Other Ambulatory Visit: Payer: Medicare Other

## 2022-06-25 ENCOUNTER — Ambulatory Visit: Payer: 59 | Admitting: Cardiovascular Disease

## 2022-07-02 ENCOUNTER — Ambulatory Visit (INDEPENDENT_AMBULATORY_CARE_PROVIDER_SITE_OTHER): Payer: 59 | Admitting: Cardiovascular Disease

## 2022-07-02 ENCOUNTER — Encounter: Payer: Self-pay | Admitting: Cardiovascular Disease

## 2022-07-02 VITALS — BP 128/90 | HR 95 | Ht 63.0 in | Wt 149.2 lb

## 2022-07-02 DIAGNOSIS — I10 Essential (primary) hypertension: Secondary | ICD-10-CM

## 2022-07-02 DIAGNOSIS — E782 Mixed hyperlipidemia: Secondary | ICD-10-CM

## 2022-07-02 DIAGNOSIS — R0602 Shortness of breath: Secondary | ICD-10-CM

## 2022-07-02 DIAGNOSIS — F17210 Nicotine dependence, cigarettes, uncomplicated: Secondary | ICD-10-CM

## 2022-07-02 DIAGNOSIS — I5040 Unspecified combined systolic (congestive) and diastolic (congestive) heart failure: Secondary | ICD-10-CM

## 2022-07-02 NOTE — Progress Notes (Signed)
Cardiology Office Note   Date:  07/02/2022   ID:  Angela Daniel, DOB 1955-11-05, MRN [id number]  PCP:  Margaretann Loveless, MD  Cardiologist:  Adrian Blackwater, MD      History of Present Illness: Angela Daniel is a 68 y.o. female who presents for  Chief Complaint  Patient presents with   Follow-up    Follow up    Patient in office for 2 month follow up. Complains of feeling tired.     Past Medical History:  Diagnosis Date   Congestive heart failure (HCC)    COPD (chronic obstructive pulmonary disease) (HCC)    Degenerative joint disease    Diabetes mellitus without complication (HCC)    Fibromyalgia    GERD (gastroesophageal reflux disease)    Hypertension    Scoliosis    Sleep apnea      Past Surgical History:  Procedure Laterality Date   ABDOMINAL HYSTERECTOMY     APPENDECTOMY     CESAREAN SECTION     x3   CHOLECYSTECTOMY     HIP SURGERY Left    joint replacement   KYPHOPLASTY N/A 05/18/2019   Procedure: T6 KYPHOPLASTY;  Surgeon: Kennedy Bucker, MD;  Location: [facility] ORS;  Service: Orthopedics;  Laterality: N/A;     Current Outpatient Medications  Medication Sig Dispense Refill   albuterol (VENTOLIN HFA) 108 (90 Base) MCG/ACT inhaler Inhale 2 puffs into the lungs every 4 (four) hours as needed.     alprazolam (XANAX) 2 MG tablet Take 2 mg by mouth 3 (three) times daily.     amLODipine (NORVASC) 2.5 MG tablet TAKE 1 TABLET BY MOUTH ONCE DAILY 90 tablet 0   amphetamine-dextroamphetamine (ADDERALL) 20 MG tablet Take 20 mg by mouth once a week.     amphetamine-dextroamphetamine (ADDERALL) 30 MG tablet Take 1 tablet by mouth 2 (two) times daily.     ARIPiprazole (ABILIFY) 15 MG tablet Take 15 mg by mouth once.     aspirin EC 81 MG tablet Take 81 mg by mouth daily.     blood glucose meter kit and supplies KIT Dispense based on patient and insurance preference. Use up to four times daily as directed. Check blood sugar three times a day 1 each 0   buprenorphine  (SUBUTEX) 8 MG SUBL SL tablet Place 8 mg under the tongue 3 (three) times daily.     Cholecalciferol (VITAMIN D3) 50 MCG (2000 UT) TABS Take 2,000 mg by mouth daily.     Cyanocobalamin 2500 MCG CHEW Chew 2,500 mcg by mouth daily.     diclofenac (VOLTAREN) 50 MG EC tablet Take 50 mg by mouth 2 (two) times daily as needed for mild pain.     esomeprazole (NEXIUM) 40 MG capsule Take 40 mg by mouth daily.     fluticasone (FLONASE) 50 MCG/ACT nasal spray Place 1 spray into both nostrils at bedtime.     Fluticasone-Salmeterol (ADVAIR) 250-50 MCG/DOSE AEPB Inhale 1 puff into the lungs 2 (two) times daily.     furosemide (LASIX) 40 MG tablet Take 1 tablet (40 mg total) by mouth 2 (two) times daily. 60 tablet 11   gabapentin (NEURONTIN) 600 MG tablet Take 600 mg by mouth daily.     gabapentin (NEURONTIN) 800 MG tablet Take 800 mg by mouth 3 (three) times daily.     isosorbide mononitrate (IMDUR) 30 MG 24 hr tablet Take 30 mg by mouth daily.     levETIRAcetam (KEPPRA) 500 MG tablet TAKE  1 TABLET BY MOUTH TWICE DAILY 60 tablet 3   lisinopril (ZESTRIL) 10 MG tablet Take 10 mg by mouth daily.     magnesium oxide (MAG-OX) 400 (240 Mg) MG tablet Take 1 tablet (400 mg total) by mouth 2 (two) times daily. 30 tablet 0   metFORMIN (GLUCOPHAGE) 500 MG tablet Take 500 mg by mouth 2 (two) times daily with a meal.      metoprolol succinate (TOPROL-XL) 50 MG 24 hr tablet TAKE 1 TABLET BY MOUTH EVERY DAY 90 tablet 1   Omega-3 Fatty Acids (FISH OIL) 500 MG CAPS Take 1 capsule by mouth 1 day or 1 dose.     ondansetron (ZOFRAN-ODT) 4 MG disintegrating tablet Take 4 mg by mouth every 6 (six) hours as needed.     rosuvastatin (CRESTOR) 20 MG tablet TAKE 1 TABLET BY MOUTH ONCE EVERY EVENING 90 tablet 0   SPIRIVA HANDIHALER 18 MCG inhalation capsule Place 18 mcg into inhaler and inhale daily.     spironolactone (ALDACTONE) 25 MG tablet Take 0.5 tablets (12.5 mg total) by mouth daily. 30 tablet 3   tiZANidine (ZANAFLEX) 4 MG  tablet Take 4 mg by mouth every 6 (six) hours as needed.     Vitamin E 400 units TABS Take 400 Units by mouth daily.     No current facility-administered medications for this visit.    Allergies:   Tramadol, Tylenol [acetaminophen], and Vicodin [hydrocodone-acetaminophen]    Social History:   reports that she has been smoking cigarettes. She has been smoking an average of .5 packs per day. She has never used smokeless tobacco. She reports that she does not drink alcohol and does not use drugs.   Family History:  family history includes Asthma in her father; CVA in her mother; Diabetes in her mother; Heart attack in her father; Hypertension in her father; Lung cancer in her father and mother.    ROS:     Review of Systems  Constitutional: Negative.   HENT: Negative.    Eyes: Negative.   Respiratory: Negative.    Cardiovascular: Negative.   Gastrointestinal: Negative.   Genitourinary: Negative.   Musculoskeletal: Negative.   Skin: Negative.   Neurological: Negative.   Endo/Heme/Allergies: Negative.   Psychiatric/Behavioral: Negative.    All other systems reviewed and are negative.    All other systems are reviewed and negative.    PHYSICAL EXAM: VS:  BP (!) 128/90   Pulse 95   Ht 5\' 3"  (1.6 m)   Wt 149 lb 3.2 oz (67.7 kg)   SpO2 100%   BMI 26.43 kg/m  , BMI Body mass index is 26.43 kg/m. Last weight:  Wt Readings from Last 3 Encounters:  07/02/22 149 lb 3.2 oz (67.7 kg)  04/22/22 149 lb 9.6 oz (67.9 kg)  03/26/22 147 lb 3.2 oz (66.8 kg)    Physical Exam Constitutional:      Appearance: Normal appearance.  Cardiovascular:     Rate and Rhythm: Normal rate and regular rhythm.     Heart sounds: Normal heart sounds.  Pulmonary:     Effort: Pulmonary effort is normal.     Breath sounds: Normal breath sounds.  Musculoskeletal:     Right lower leg: No edema.     Left lower leg: No edema.  Neurological:     Mental Status: She is alert.     EKG: sinus rhythm, HR  88 bpm, RBBB  Recent Labs: 09/05/2021: ALT 13; Hemoglobin 11.3; Platelets 188 03/12/2022: BUN 13;  Creatinine, Ser 0.78; Potassium 5.1; Sodium 139    Lipid Panel No results found for: "CHOL", "TRIG", "HDL", "CHOLHDL", "VLDL", "LDLCALC", "LDLDIRECT"    ASSESSMENT AND PLAN:    ICD-10-CM   1. Combined systolic and diastolic congestive heart failure, unspecified HF chronicity (HCC)  I50.40     2. Primary hypertension  I10     3. Mixed hyperlipidemia  E78.2     4. SOB (shortness of breath)  R06.02     5. Cigarette nicotine dependence without complication  F17.210        Problem List Items Addressed This Visit       Cardiovascular and Mediastinum   Hypertension   Congestive heart failure (HCC) - Primary    Patient doing well. Denies chest pain, shortness of breath. Under stress due to sister's health issues.         Other   HLD (hyperlipidemia)    On rosuvastatin 20 mg, fish oil, continue.      Nicotine dependence    Smoking cessation instruction/counseling given:  counseled patient on the dangers of tobacco use, advised patient to stop smoking, and reviewed strategies to maximize success       SOB (shortness of breath)    Improved since starting spironolactone.         Disposition:   Return in about 3 months (around 10/02/2022).    Total time spent: 30 minutes  Signed,  Adrian Blackwater, MD  07/02/2022 10:55 AM    [facility]

## 2022-07-02 NOTE — Assessment & Plan Note (Signed)
Smoking cessation instruction/counseling given:  counseled patient on the dangers of tobacco use, advised patient to stop smoking, and reviewed strategies to maximize success 

## 2022-07-02 NOTE — Assessment & Plan Note (Signed)
Patient doing well. Denies chest pain, shortness of breath. Under stress due to sister's health issues.

## 2022-07-02 NOTE — Assessment & Plan Note (Signed)
Improved since starting spironolactone.

## 2022-07-02 NOTE — Assessment & Plan Note (Signed)
On rosuvastatin 20 mg, fish oil, continue.

## 2022-07-08 ENCOUNTER — Encounter: Payer: Self-pay | Admitting: Cardiovascular Disease

## 2022-07-20 DIAGNOSIS — J449 Chronic obstructive pulmonary disease, unspecified: Secondary | ICD-10-CM | POA: Diagnosis not present

## 2022-08-19 DIAGNOSIS — J449 Chronic obstructive pulmonary disease, unspecified: Secondary | ICD-10-CM | POA: Diagnosis not present

## 2022-09-19 DIAGNOSIS — J449 Chronic obstructive pulmonary disease, unspecified: Secondary | ICD-10-CM | POA: Diagnosis not present

## 2022-09-20 ENCOUNTER — Other Ambulatory Visit: Payer: Self-pay | Admitting: Cardiovascular Disease

## 2022-09-20 DIAGNOSIS — R0602 Shortness of breath: Secondary | ICD-10-CM

## 2022-09-25 ENCOUNTER — Other Ambulatory Visit: Payer: Self-pay | Admitting: Cardiovascular Disease

## 2022-09-25 ENCOUNTER — Other Ambulatory Visit: Payer: Self-pay | Admitting: Internal Medicine

## 2022-09-25 DIAGNOSIS — G40909 Epilepsy, unspecified, not intractable, without status epilepticus: Secondary | ICD-10-CM

## 2022-09-30 ENCOUNTER — Ambulatory Visit (INDEPENDENT_AMBULATORY_CARE_PROVIDER_SITE_OTHER): Payer: 59 | Admitting: Cardiovascular Disease

## 2022-09-30 ENCOUNTER — Encounter: Payer: Self-pay | Admitting: Cardiovascular Disease

## 2022-09-30 VITALS — BP 120/80 | HR 51 | Ht 63.0 in | Wt 153.2 lb

## 2022-09-30 DIAGNOSIS — R001 Bradycardia, unspecified: Secondary | ICD-10-CM | POA: Diagnosis not present

## 2022-09-30 DIAGNOSIS — I10 Essential (primary) hypertension: Secondary | ICD-10-CM | POA: Diagnosis not present

## 2022-09-30 DIAGNOSIS — R0602 Shortness of breath: Secondary | ICD-10-CM

## 2022-09-30 DIAGNOSIS — E782 Mixed hyperlipidemia: Secondary | ICD-10-CM | POA: Diagnosis not present

## 2022-09-30 DIAGNOSIS — I5040 Unspecified combined systolic (congestive) and diastolic (congestive) heart failure: Secondary | ICD-10-CM | POA: Diagnosis not present

## 2022-09-30 DIAGNOSIS — F17210 Nicotine dependence, cigarettes, uncomplicated: Secondary | ICD-10-CM

## 2022-09-30 MED ORDER — METOPROLOL SUCCINATE ER 25 MG PO TB24
25.0000 mg | ORAL_TABLET | Freq: Every day | ORAL | 11 refills | Status: DC
Start: 2022-09-30 — End: 2022-11-29

## 2022-09-30 NOTE — Progress Notes (Signed)
Cardiology Office Note   Date:  09/30/2022   ID:  Angela Daniel, DOB 28-Apr-1955, MRN [id number]  PCP:  Margaretann Loveless, MD  Cardiologist:  Adrian Blackwater, MD      History of Present Illness: Angela Daniel is a 67 y.o. female who presents for  Chief Complaint  Patient presents with   Follow-up    3 mo f/u    Feeling good      Past Medical History:  Diagnosis Date   Congestive heart failure (HCC)    COPD (chronic obstructive pulmonary disease) (HCC)    Degenerative joint disease    Diabetes mellitus without complication (HCC)    Fibromyalgia    GERD (gastroesophageal reflux disease)    Hypertension    Scoliosis    Sleep apnea      Past Surgical History:  Procedure Laterality Date   ABDOMINAL HYSTERECTOMY     APPENDECTOMY     CESAREAN SECTION     x3   CHOLECYSTECTOMY     HIP SURGERY Left    joint replacement   KYPHOPLASTY N/A 05/18/2019   Procedure: T6 KYPHOPLASTY;  Surgeon: Kennedy Bucker, MD;  Location: [facility] ORS;  Service: Orthopedics;  Laterality: N/A;     Current Outpatient Medications  Medication Sig Dispense Refill   metoprolol succinate (TOPROL XL) 25 MG 24 hr tablet Take 1 tablet (25 mg total) by mouth daily. 30 tablet 11   albuterol (VENTOLIN HFA) 108 (90 Base) MCG/ACT inhaler Inhale 2 puffs into the lungs every 4 (four) hours as needed.     alprazolam (XANAX) 2 MG tablet Take 2 mg by mouth 3 (three) times daily.     amLODipine (NORVASC) 2.5 MG tablet TAKE 1 TABLET BY MOUTH ONCE DAILY 90 tablet 0   amphetamine-dextroamphetamine (ADDERALL) 20 MG tablet Take 20 mg by mouth once a week.     amphetamine-dextroamphetamine (ADDERALL) 30 MG tablet Take 1 tablet by mouth 2 (two) times daily.     ARIPiprazole (ABILIFY) 15 MG tablet Take 15 mg by mouth once.     aspirin EC 81 MG tablet Take 81 mg by mouth daily.     blood glucose meter kit and supplies KIT Dispense based on patient and insurance preference. Use up to four times daily as directed. Check  blood sugar three times a day 1 each 0   buprenorphine (SUBUTEX) 8 MG SUBL SL tablet Place 8 mg under the tongue 3 (three) times daily.     Cholecalciferol (VITAMIN D3) 50 MCG (2000 UT) TABS Take 2,000 mg by mouth daily.     Cyanocobalamin 2500 MCG CHEW Chew 2,500 mcg by mouth daily.     diclofenac (VOLTAREN) 50 MG EC tablet Take 50 mg by mouth 2 (two) times daily as needed for mild pain.     esomeprazole (NEXIUM) 40 MG capsule Take 40 mg by mouth daily.     fluticasone (FLONASE) 50 MCG/ACT nasal spray Place 1 spray into both nostrils at bedtime.     Fluticasone-Salmeterol (ADVAIR) 250-50 MCG/DOSE AEPB Inhale 1 puff into the lungs 2 (two) times daily.     furosemide (LASIX) 40 MG tablet Take 1 tablet (40 mg total) by mouth 2 (two) times daily. 60 tablet 11   gabapentin (NEURONTIN) 600 MG tablet Take 600 mg by mouth daily.     gabapentin (NEURONTIN) 800 MG tablet Take 800 mg by mouth 3 (three) times daily.     isosorbide mononitrate (IMDUR) 30 MG 24 hr tablet Take  30 mg by mouth daily.     levETIRAcetam (KEPPRA) 500 MG tablet TAKE 1 TABLET BY MOUTH TWICE DAILY 60 tablet 3   lisinopril (ZESTRIL) 10 MG tablet Take 10 mg by mouth daily.     magnesium oxide (MAG-OX) 400 (240 Mg) MG tablet Take 1 tablet (400 mg total) by mouth 2 (two) times daily. 30 tablet 0   metFORMIN (GLUCOPHAGE) 500 MG tablet Take 500 mg by mouth 2 (two) times daily with a meal.      Omega-3 Fatty Acids (FISH OIL) 500 MG CAPS Take 1 capsule by mouth 1 day or 1 dose.     ondansetron (ZOFRAN-ODT) 4 MG disintegrating tablet Take 4 mg by mouth every 6 (six) hours as needed.     rosuvastatin (CRESTOR) 20 MG tablet TAKE 1 TABLET BY MOUTH ONCE EVERY EVENING 90 tablet 0   SPIRIVA HANDIHALER 18 MCG inhalation capsule Place 18 mcg into inhaler and inhale daily.     spironolactone (ALDACTONE) 25 MG tablet TAKE 1/2 TABLET BY MOUTH EVERY DAY 45 tablet 2   tiZANidine (ZANAFLEX) 4 MG tablet Take 4 mg by mouth every 6 (six) hours as needed.      Vitamin E 400 units TABS Take 400 Units by mouth daily.     No current facility-administered medications for this visit.    Allergies:   Tramadol, Tylenol [acetaminophen], and Vicodin [hydrocodone-acetaminophen]    Social History:   reports that she has been smoking cigarettes. She has never used smokeless tobacco. She reports that she does not drink alcohol and does not use drugs.   Family History:  family history includes Asthma in her father; CVA in her mother; Diabetes in her mother; Heart attack in her father; Hypertension in her father; Lung cancer in her father and mother.    ROS:     Review of Systems  Constitutional: Negative.   HENT: Negative.    Eyes: Negative.   Respiratory: Negative.    Gastrointestinal: Negative.   Genitourinary: Negative.   Musculoskeletal: Negative.   Skin: Negative.   Neurological: Negative.   Endo/Heme/Allergies: Negative.   Psychiatric/Behavioral: Negative.    All other systems reviewed and are negative.     All other systems are reviewed and negative.    PHYSICAL EXAM: VS:  BP 120/80   Pulse (!) 51   Ht 5\' 3"  (1.6 m)   Wt 153 lb 3.2 oz (69.5 kg)   SpO2 94%   BMI 27.14 kg/m  , BMI Body mass index is 27.14 kg/m. Last weight:  Wt Readings from Last 3 Encounters:  09/30/22 153 lb 3.2 oz (69.5 kg)  07/02/22 149 lb 3.2 oz (67.7 kg)  04/22/22 149 lb 9.6 oz (67.9 kg)     Physical Exam Constitutional:      Appearance: Normal appearance.  Cardiovascular:     Rate and Rhythm: Normal rate and regular rhythm.     Heart sounds: Normal heart sounds.  Pulmonary:     Effort: Pulmonary effort is normal.     Breath sounds: Normal breath sounds.  Musculoskeletal:     Right lower leg: No edema.     Left lower leg: No edema.  Neurological:     Mental Status: She is alert.       EKG:   Recent Labs: 03/12/2022: BUN 13; Creatinine, Ser 0.78; Potassium 5.1; Sodium 139    Lipid Panel No results found for: "CHOL", "TRIG", "HDL",  "CHOLHDL", "VLDL", "LDLCALC", "LDLDIRECT"    Other studies Reviewed: Additional  studies/ records that were reviewed today include:  Review of the above records demonstrates:       No data to display            ASSESSMENT AND PLAN:    ICD-10-CM   1. Primary hypertension  I10 metoprolol succinate (TOPROL XL) 25 MG 24 hr tablet    2. Combined systolic and diastolic congestive heart failure, unspecified HF chronicity (HCC)  I50.40 metoprolol succinate (TOPROL XL) 25 MG 24 hr tablet   compensated    3. Cigarette nicotine dependence without complication  F17.210 metoprolol succinate (TOPROL XL) 25 MG 24 hr tablet    4. SOB (shortness of breath)  R06.02 metoprolol succinate (TOPROL XL) 25 MG 24 hr tablet    5. Mixed hyperlipidemia  E78.2 metoprolol succinate (TOPROL XL) 25 MG 24 hr tablet    6. Sinus bradycardia  R00.1 metoprolol succinate (TOPROL XL) 25 MG 24 hr tablet   feels tired, may need to decrease metoprolol dosage       Problem List Items Addressed This Visit       Cardiovascular and Mediastinum   Hypertension - Primary   Relevant Medications   metoprolol succinate (TOPROL XL) 25 MG 24 hr tablet   Congestive heart failure (HCC)   Relevant Medications   metoprolol succinate (TOPROL XL) 25 MG 24 hr tablet     Other   HLD (hyperlipidemia)   Relevant Medications   metoprolol succinate (TOPROL XL) 25 MG 24 hr tablet   Nicotine dependence   Relevant Medications   metoprolol succinate (TOPROL XL) 25 MG 24 hr tablet   SOB (shortness of breath)   Relevant Medications   metoprolol succinate (TOPROL XL) 25 MG 24 hr tablet   Other Visit Diagnoses     Sinus bradycardia       feels tired, may need to decrease metoprolol dosage   Relevant Medications   metoprolol succinate (TOPROL XL) 25 MG 24 hr tablet          Disposition:   Return in about 2 months (around 11/30/2022).    Total time spent: 30 minutes  Signed,  Adrian Blackwater, MD  09/30/2022 11:39 AM     [facility]

## 2022-10-03 ENCOUNTER — Other Ambulatory Visit: Payer: Self-pay | Admitting: Internal Medicine

## 2022-10-03 DIAGNOSIS — G40909 Epilepsy, unspecified, not intractable, without status epilepticus: Secondary | ICD-10-CM

## 2022-10-07 ENCOUNTER — Ambulatory Visit: Payer: 59 | Admitting: Internal Medicine

## 2022-10-14 ENCOUNTER — Encounter: Payer: Self-pay | Admitting: Cardiovascular Disease

## 2022-10-14 ENCOUNTER — Ambulatory Visit (INDEPENDENT_AMBULATORY_CARE_PROVIDER_SITE_OTHER): Payer: 59 | Admitting: Cardiovascular Disease

## 2022-10-14 VITALS — BP 110/85 | HR 72 | Ht 63.0 in | Wt 155.6 lb

## 2022-10-14 DIAGNOSIS — R001 Bradycardia, unspecified: Secondary | ICD-10-CM

## 2022-10-14 DIAGNOSIS — I10 Essential (primary) hypertension: Secondary | ICD-10-CM

## 2022-10-14 DIAGNOSIS — F17210 Nicotine dependence, cigarettes, uncomplicated: Secondary | ICD-10-CM

## 2022-10-14 DIAGNOSIS — I5040 Unspecified combined systolic (congestive) and diastolic (congestive) heart failure: Secondary | ICD-10-CM | POA: Diagnosis not present

## 2022-10-14 DIAGNOSIS — F17219 Nicotine dependence, cigarettes, with unspecified nicotine-induced disorders: Secondary | ICD-10-CM | POA: Diagnosis not present

## 2022-10-14 DIAGNOSIS — R0602 Shortness of breath: Secondary | ICD-10-CM | POA: Diagnosis not present

## 2022-10-14 DIAGNOSIS — E782 Mixed hyperlipidemia: Secondary | ICD-10-CM

## 2022-10-14 MED ORDER — TORSEMIDE 10 MG PO TABS: ORAL_TABLET | ORAL | 2 refills | Status: DC

## 2022-10-14 NOTE — Progress Notes (Signed)
Cardiology Office Note   Date:  10/14/2022   ID:  Season, [name] 1955/04/10, MRN [id number]  PCP:  Margaretann Loveless, MD  Cardiologist:  Adrian Blackwater, MD      History of Present Illness: Angela Daniel is a 67 y.o. female who presents for  Chief Complaint  Patient presents with  . Follow-up    Swelling in feet    Called last nigh, nausia, vomiting SOB, and worsening leg swelling  Shortness of Breath This is a recurrent problem. The current episode started 1 to 4 weeks ago.      Past Medical History:  Diagnosis Date  . Congestive heart failure (HCC)   . COPD (chronic obstructive pulmonary disease) (HCC)   . Degenerative joint disease   . Diabetes mellitus without complication (HCC)   . Fibromyalgia   . GERD (gastroesophageal reflux disease)   . Hypertension   . Scoliosis   . Sleep apnea      Past Surgical History:  Procedure Laterality Date  . ABDOMINAL HYSTERECTOMY    . APPENDECTOMY    . CESAREAN SECTION     x3  . CHOLECYSTECTOMY    . HIP SURGERY Left    joint replacement  . KYPHOPLASTY N/A 05/18/2019   Procedure: T6 KYPHOPLASTY;  Surgeon: Kennedy Bucker, MD;  Location: [facility] ORS;  Service: Orthopedics;  Laterality: N/A;     Current Outpatient Medications  Medication Sig Dispense Refill  . torsemide (DEMADEX) 10 MG tablet Take 2 tab bid 120 tablet 2  . albuterol (VENTOLIN HFA) 108 (90 Base) MCG/ACT inhaler Inhale 2 puffs into the lungs every 4 (four) hours as needed.    Marland Kitchen alprazolam (XANAX) 2 MG tablet Take 2 mg by mouth 3 (three) times daily.    Marland Kitchen amphetamine-dextroamphetamine (ADDERALL) 20 MG tablet Take 20 mg by mouth once a week.    Marland Kitchen amphetamine-dextroamphetamine (ADDERALL) 30 MG tablet Take 1 tablet by mouth 2 (two) times daily.    . ARIPiprazole (ABILIFY) 15 MG tablet Take 15 mg by mouth once.    Marland Kitchen aspirin EC 81 MG tablet Take 81 mg by mouth daily.    . blood glucose meter kit and supplies KIT Dispense based on patient and insurance  preference. Use up to four times daily as directed. Check blood sugar three times a day 1 each 0  . buprenorphine (SUBUTEX) 8 MG SUBL SL tablet Place 8 mg under the tongue 3 (three) times daily.    . Cholecalciferol (VITAMIN D3) 50 MCG (2000 UT) TABS Take 2,000 mg by mouth daily.    . Cyanocobalamin 2500 MCG CHEW Chew 2,500 mcg by mouth daily.    . diclofenac (VOLTAREN) 50 MG EC tablet Take 50 mg by mouth 2 (two) times daily as needed for mild pain.    Marland Kitchen esomeprazole (NEXIUM) 40 MG capsule Take 40 mg by mouth daily.    . fluticasone (FLONASE) 50 MCG/ACT nasal spray Place 1 spray into both nostrils at bedtime.    . Fluticasone-Salmeterol (ADVAIR) 250-50 MCG/DOSE AEPB Inhale 1 puff into the lungs 2 (two) times daily.    Marland Kitchen gabapentin (NEURONTIN) 600 MG tablet Take 600 mg by mouth daily.    Marland Kitchen gabapentin (NEURONTIN) 800 MG tablet Take 800 mg by mouth 3 (three) times daily.    . isosorbide mononitrate (IMDUR) 30 MG 24 hr tablet Take 30 mg by mouth daily.    Marland Kitchen levETIRAcetam (KEPPRA) 500 MG tablet TAKE 1 TABLET BY MOUTH TWICE DAILY 60  tablet 3  . lisinopril (ZESTRIL) 10 MG tablet Take 10 mg by mouth daily.    . magnesium oxide (MAG-OX) 400 (240 Mg) MG tablet Take 1 tablet (400 mg total) by mouth 2 (two) times daily. 30 tablet 0  . metFORMIN (GLUCOPHAGE) 500 MG tablet Take 500 mg by mouth 2 (two) times daily with a meal.     . metoprolol succinate (TOPROL XL) 25 MG 24 hr tablet Take 1 tablet (25 mg total) by mouth daily. 30 tablet 11  . Omega-3 Fatty Acids (FISH OIL) 500 MG CAPS Take 1 capsule by mouth 1 day or 1 dose.    . ondansetron (ZOFRAN-ODT) 4 MG disintegrating tablet Take 4 mg by mouth every 6 (six) hours as needed.    . rosuvastatin (CRESTOR) 20 MG tablet TAKE 1 TABLET BY MOUTH ONCE EVERY EVENING 90 tablet 0  . SPIRIVA HANDIHALER 18 MCG inhalation capsule Place 18 mcg into inhaler and inhale daily.    Marland Kitchen spironolactone (ALDACTONE) 25 MG tablet TAKE 1/2 TABLET BY MOUTH EVERY DAY 45 tablet 2  .  tiZANidine (ZANAFLEX) 4 MG tablet Take 4 mg by mouth every 6 (six) hours as needed.    . Vitamin E 400 units TABS Take 400 Units by mouth daily.     No current facility-administered medications for this visit.    Allergies:   Tramadol, Tylenol [acetaminophen], and Vicodin [hydrocodone-acetaminophen]    Social History:   reports that she has been smoking cigarettes. She has never used smokeless tobacco. She reports that she does not drink alcohol and does not use drugs.   Family History:  family history includes Asthma in her father; CVA in her mother; Diabetes in her mother; Heart attack in her father; Hypertension in her father; Lung cancer in her father and mother.    ROS:     Review of Systems  Constitutional: Negative.   HENT: Negative.    Eyes: Negative.   Respiratory:  Positive for shortness of breath.   Gastrointestinal: Negative.   Genitourinary: Negative.   Musculoskeletal: Negative.   Skin: Negative.   Neurological: Negative.   Endo/Heme/Allergies: Negative.   Psychiatric/Behavioral: Negative.    All other systems reviewed and are negative.     All other systems are reviewed and negative.    PHYSICAL EXAM: VS:  BP 110/85   Pulse 72   Ht 5\' 3"  (1.6 m)   Wt 155 lb 9.6 oz (70.6 kg)   SpO2 95%   BMI 27.56 kg/m  , BMI Body mass index is 27.56 kg/m. Last weight:  Wt Readings from Last 3 Encounters:  10/14/22 155 lb 9.6 oz (70.6 kg)  09/30/22 153 lb 3.2 oz (69.5 kg)  07/02/22 149 lb 3.2 oz (67.7 kg)     Physical Exam Constitutional:      Appearance: Normal appearance.  Cardiovascular:     Rate and Rhythm: Normal rate and regular rhythm.     Heart sounds: Normal heart sounds.  Pulmonary:     Effort: Pulmonary effort is normal.     Breath sounds: Normal breath sounds.  Musculoskeletal:     Right lower leg: No edema.     Left lower leg: No edema.  Neurological:     Mental Status: She is alert.      EKG:   Recent Labs: 03/12/2022: BUN 13;  Creatinine, Ser 0.78; Potassium 5.1; Sodium 139    Lipid Panel No results found for: "CHOL", "TRIG", "HDL", "CHOLHDL", "VLDL", "LDLCALC", "LDLDIRECT"    Other  studies Reviewed: Additional studies/ records that were reviewed today include:  Review of the above records demonstrates:       No data to display            ASSESSMENT AND PLAN:    ICD-10-CM   1. SOB (shortness of breath)  R06.02 MYOCARDIAL PERFUSION IMAGING    PCV ECHOCARDIOGRAM COMPLETE   do echo, stress test    2. Mixed hyperlipidemia  E78.2 MYOCARDIAL PERFUSION IMAGING    PCV ECHOCARDIOGRAM COMPLETE    3. Sinus bradycardia  R00.1 MYOCARDIAL PERFUSION IMAGING    PCV ECHOCARDIOGRAM COMPLETE    4. Combined systolic and diastolic congestive heart failure, unspecified HF chronicity (HCC)  I50.40 MYOCARDIAL PERFUSION IMAGING    PCV ECHOCARDIOGRAM COMPLETE   change Furesemide 40 bid to toresemide 20 bid, stopp amlodpine as causese swelling of legs, as getting worse    5. Primary hypertension  I10 MYOCARDIAL PERFUSION IMAGING    PCV ECHOCARDIOGRAM COMPLETE    6. Cigarette nicotine dependence without complication  F17.210 MYOCARDIAL PERFUSION IMAGING    PCV ECHOCARDIOGRAM COMPLETE       Problem List Items Addressed This Visit       Cardiovascular and Mediastinum   Hypertension   Relevant Medications   torsemide (DEMADEX) 10 MG tablet   Other Relevant Orders   MYOCARDIAL PERFUSION IMAGING   PCV ECHOCARDIOGRAM COMPLETE   Congestive heart failure (HCC)   Relevant Medications   torsemide (DEMADEX) 10 MG tablet   Other Relevant Orders   MYOCARDIAL PERFUSION IMAGING   PCV ECHOCARDIOGRAM COMPLETE     Other   HLD (hyperlipidemia)   Relevant Medications   torsemide (DEMADEX) 10 MG tablet   Other Relevant Orders   MYOCARDIAL PERFUSION IMAGING   PCV ECHOCARDIOGRAM COMPLETE   Nicotine dependence   Relevant Orders   MYOCARDIAL PERFUSION IMAGING   PCV ECHOCARDIOGRAM COMPLETE   SOB (shortness of breath) -  Primary   Relevant Orders   MYOCARDIAL PERFUSION IMAGING   PCV ECHOCARDIOGRAM COMPLETE   Other Visit Diagnoses     Sinus bradycardia       Relevant Medications   torsemide (DEMADEX) 10 MG tablet   Other Relevant Orders   MYOCARDIAL PERFUSION IMAGING   PCV ECHOCARDIOGRAM COMPLETE          Disposition:   Return in about 2 weeks (around 10/28/2022) for echo, stress test and f/u.    Total time spent: 30 minutes  Signed,  Adrian Blackwater, MD  10/14/2022 11:59 AM    [facility]

## 2022-10-20 DIAGNOSIS — J449 Chronic obstructive pulmonary disease, unspecified: Secondary | ICD-10-CM | POA: Diagnosis not present

## 2022-10-24 ENCOUNTER — Other Ambulatory Visit: Payer: Self-pay | Admitting: Cardiovascular Disease

## 2022-11-01 ENCOUNTER — Ambulatory Visit: Payer: 59

## 2022-11-07 ENCOUNTER — Ambulatory Visit: Payer: 59 | Admitting: Cardiovascular Disease

## 2022-11-19 DIAGNOSIS — J449 Chronic obstructive pulmonary disease, unspecified: Secondary | ICD-10-CM | POA: Diagnosis not present

## 2022-11-29 ENCOUNTER — Other Ambulatory Visit: Payer: 59

## 2022-11-29 ENCOUNTER — Ambulatory Visit: Payer: 59 | Admitting: Cardiovascular Disease

## 2022-11-29 ENCOUNTER — Other Ambulatory Visit: Payer: Self-pay | Admitting: Cardiovascular Disease

## 2022-12-03 ENCOUNTER — Ambulatory Visit: Payer: 59 | Admitting: Cardiovascular Disease

## 2022-12-20 DIAGNOSIS — J449 Chronic obstructive pulmonary disease, unspecified: Secondary | ICD-10-CM | POA: Diagnosis not present

## 2023-01-09 ENCOUNTER — Encounter: Payer: Self-pay | Admitting: Internal Medicine

## 2023-01-09 ENCOUNTER — Ambulatory Visit (INDEPENDENT_AMBULATORY_CARE_PROVIDER_SITE_OTHER): Payer: 59 | Admitting: Internal Medicine

## 2023-01-09 VITALS — BP 147/89 | HR 115 | Ht 63.0 in | Wt 150.4 lb

## 2023-01-09 DIAGNOSIS — J40 Bronchitis, not specified as acute or chronic: Secondary | ICD-10-CM

## 2023-01-09 DIAGNOSIS — J449 Chronic obstructive pulmonary disease, unspecified: Secondary | ICD-10-CM

## 2023-01-09 DIAGNOSIS — E1159 Type 2 diabetes mellitus with other circulatory complications: Secondary | ICD-10-CM | POA: Diagnosis not present

## 2023-01-09 DIAGNOSIS — F17200 Nicotine dependence, unspecified, uncomplicated: Secondary | ICD-10-CM | POA: Diagnosis not present

## 2023-01-09 DIAGNOSIS — G40909 Epilepsy, unspecified, not intractable, without status epilepticus: Secondary | ICD-10-CM

## 2023-01-09 DIAGNOSIS — E782 Mixed hyperlipidemia: Secondary | ICD-10-CM

## 2023-01-09 DIAGNOSIS — F418 Other specified anxiety disorders: Secondary | ICD-10-CM

## 2023-01-09 DIAGNOSIS — I152 Hypertension secondary to endocrine disorders: Secondary | ICD-10-CM | POA: Diagnosis not present

## 2023-01-09 DIAGNOSIS — I5040 Unspecified combined systolic (congestive) and diastolic (congestive) heart failure: Secondary | ICD-10-CM

## 2023-01-09 DIAGNOSIS — E1169 Type 2 diabetes mellitus with other specified complication: Secondary | ICD-10-CM

## 2023-01-09 DIAGNOSIS — E559 Vitamin D deficiency, unspecified: Secondary | ICD-10-CM

## 2023-01-09 MED ORDER — AMOXICILLIN-POT CLAVULANATE 500-125 MG PO TABS: 1.0000 | ORAL_TABLET | Freq: Two times a day (BID) | ORAL | 0 refills | Status: DC

## 2023-01-09 MED ORDER — METHYLPREDNISOLONE 4 MG PO TBPK: ORAL_TABLET | ORAL | 0 refills | Status: DC

## 2023-01-09 NOTE — Progress Notes (Addendum)
Established Patient Office Visit  Subjective:  Patient ID: Angela Daniel, female    DOB: 30-Mar-1955  Age: 67 y.o. MRN: [id number]  Chief Complaint  Patient presents with   Follow-up    Lost her sister 3 weeks ago, been staying with her in the hospital while she was sick  Stated nothing has changed with her medications but unable to tell me if she's taking "said" medication     Patient comes in for follow-up today.  She has missed her previous appointment's at this office since March 24.   Patient reports that she was not available as her siblings were not doing well and 2 of them have passed since then.  She has been taking her medications but for the last several days she is having chest tightness, congestion, wheezing and a cough.  No fevers or chills and no body aches.  She still smokes cigarettes and has history of COPD with exacerbations. Will start her on Medrol Dosepak and an antibiotic.  Patient also needs blood work. She has missed her mammogram, DEXA and colon cancer. Will reschedule at next visit.    No other concerns at this time.   Past Medical History:  Diagnosis Date   Congestive heart failure (HCC)    COPD (chronic obstructive pulmonary disease) (HCC)    Degenerative joint disease    Diabetes mellitus without complication (HCC)    Fibromyalgia    GERD (gastroesophageal reflux disease)    Hypertension    Scoliosis    Sleep apnea     Past Surgical History:  Procedure Laterality Date   ABDOMINAL HYSTERECTOMY     APPENDECTOMY     CESAREAN SECTION     x3   CHOLECYSTECTOMY     HIP SURGERY Left    joint replacement   KYPHOPLASTY N/A 05/18/2019   Procedure: T6 KYPHOPLASTY;  Surgeon: Kennedy Bucker, MD;  Location: [facility] ORS;  Service: Orthopedics;  Laterality: N/A;    Social History   Socioeconomic History   Marital status: Single    Spouse name: Not on file   Number of children: Not on file   Years of education: Not on file   Highest education level:  Not on file  Occupational History   Not on file  Tobacco Use   Smoking status: Every Day    Current packs/day: 0.50    Types: Cigarettes   Smokeless tobacco: Never  Vaping Use   Vaping status: Never Used  Substance and Sexual Activity   Alcohol use: No   Drug use: No   Sexual activity: Not on file  Other Topics Concern   Not on file  Social History Narrative   Not on file   Social Drivers of Health   Financial Resource Strain: Not on file  Food Insecurity: Not on file  Transportation Needs: Not on file  Physical Activity: Not on file  Stress: Not on file  Social Connections: Not on file  Intimate Partner Violence: Not on file    Family History  Problem Relation Age of Onset   CVA Mother    Lung cancer Mother    Diabetes Mother    Heart attack Father    Hypertension Father    Lung cancer Father    Asthma Father    Breast cancer Neg Hx     Allergies  Allergen Reactions   Tramadol Hives   Tylenol [Acetaminophen] Nausea And Vomiting   Vicodin [Hydrocodone-Acetaminophen] Hives    Outpatient Medications Prior to Visit  Medication Sig   albuterol (VENTOLIN HFA) 108 (90 Base) MCG/ACT inhaler Inhale 2 puffs into the lungs every 4 (four) hours as needed.   alprazolam (XANAX) 2 MG tablet Take 2 mg by mouth 3 (three) times daily.   amphetamine-dextroamphetamine (ADDERALL) 20 MG tablet Take 20 mg by mouth once a week.   amphetamine-dextroamphetamine (ADDERALL) 30 MG tablet Take 1 tablet by mouth 2 (two) times daily.   ARIPiprazole (ABILIFY) 15 MG tablet Take 15 mg by mouth once.   aspirin EC 81 MG tablet Take 81 mg by mouth daily.   blood glucose meter kit and supplies KIT Dispense based on patient and insurance preference. Use up to four times daily as directed. Check blood sugar three times a day   buprenorphine (SUBUTEX) 8 MG SUBL SL tablet Place 8 mg under the tongue 3 (three) times daily.   Cholecalciferol (VITAMIN D3) 50 MCG (2000 UT) TABS Take 2,000 mg by mouth  daily.   Cyanocobalamin 2500 MCG CHEW Chew 2,500 mcg by mouth daily.   diclofenac (VOLTAREN) 50 MG EC tablet Take 50 mg by mouth 2 (two) times daily as needed for mild pain.   esomeprazole (NEXIUM) 40 MG capsule Take 40 mg by mouth daily.   fluticasone (FLONASE) 50 MCG/ACT nasal spray Place 1 spray into both nostrils at bedtime.   Fluticasone-Salmeterol (ADVAIR) 250-50 MCG/DOSE AEPB Inhale 1 puff into the lungs 2 (two) times daily.   gabapentin (NEURONTIN) 600 MG tablet Take 600 mg by mouth daily.   gabapentin (NEURONTIN) 800 MG tablet Take 800 mg by mouth 3 (three) times daily.   isosorbide mononitrate (IMDUR) 30 MG 24 hr tablet TAKE 1 TABLET BY MOUTH ONCE DAILY   levETIRAcetam (KEPPRA) 500 MG tablet TAKE 1 TABLET BY MOUTH TWICE DAILY   lisinopril (ZESTRIL) 10 MG tablet Take 10 mg by mouth daily.   magnesium oxide (MAG-OX) 400 (240 Mg) MG tablet Take 1 tablet (400 mg total) by mouth 2 (two) times daily.   metFORMIN (GLUCOPHAGE) 500 MG tablet Take 500 mg by mouth 2 (two) times daily with a meal.    metoprolol succinate (TOPROL-XL) 50 MG 24 hr tablet TAKE 1 TABLET BY MOUTH ONCE DAILY   Omega-3 Fatty Acids (FISH OIL) 500 MG CAPS Take 1 capsule by mouth 1 day or 1 dose.   ondansetron (ZOFRAN-ODT) 4 MG disintegrating tablet Take 4 mg by mouth every 6 (six) hours as needed.   rosuvastatin (CRESTOR) 20 MG tablet TAKE 1 TABLET BY MOUTH ONCE EVERY EVENING   SPIRIVA HANDIHALER 18 MCG inhalation capsule Place 18 mcg into inhaler and inhale daily.   spironolactone (ALDACTONE) 25 MG tablet TAKE 1/2 TABLET BY MOUTH EVERY DAY   tiZANidine (ZANAFLEX) 4 MG tablet Take 4 mg by mouth every 6 (six) hours as needed.   torsemide (DEMADEX) 10 MG tablet Take 2 tab bid   Vitamin E 400 units TABS Take 400 Units by mouth daily.   No facility-administered medications prior to visit.    Review of Systems  Constitutional:  Positive for malaise/fatigue. Negative for chills, diaphoresis, fever and weight loss.  HENT:   Positive for hearing loss. Negative for congestion and sore throat.   Eyes: Negative.   Respiratory:  Positive for cough, sputum production and shortness of breath. Negative for wheezing.   Cardiovascular: Negative.  Negative for chest pain, palpitations and leg swelling.  Gastrointestinal: Negative.  Negative for abdominal pain, constipation, diarrhea, heartburn, nausea and vomiting.  Genitourinary: Negative.  Negative for dysuria and flank  pain.  Musculoskeletal: Negative.  Negative for joint pain and myalgias.  Skin: Negative.   Neurological: Negative.  Negative for dizziness and headaches.  Endo/Heme/Allergies: Negative.   Psychiatric/Behavioral: Negative.  Negative for depression and suicidal ideas. The patient is not nervous/anxious.        Objective:   BP (!) 147/89   Pulse (!) 115   Ht 5\' 3"  (1.6 m)   Wt 150 lb 6.4 oz (68.2 kg)   SpO2 97%   BMI 26.64 kg/m   Vitals:   01/09/23 1311  BP: (!) 147/89  Pulse: (!) 115  Height: 5\' 3"  (1.6 m)  Weight: 150 lb 6.4 oz (68.2 kg)  SpO2: 97%  BMI (Calculated): 26.65    Physical Exam Vitals and nursing note reviewed.  Constitutional:      General: She is not in acute distress.    Appearance: Normal appearance.  HENT:     Head: Normocephalic and atraumatic.     Nose: Nose normal.     Mouth/Throat:     Mouth: Mucous membranes are moist.     Pharynx: Oropharynx is clear.  Eyes:     Conjunctiva/sclera: Conjunctivae normal.     Pupils: Pupils are equal, round, and reactive to light.  Cardiovascular:     Rate and Rhythm: Normal rate and regular rhythm.     Pulses: Normal pulses.     Heart sounds: Normal heart sounds. No murmur heard. Pulmonary:     Effort: Pulmonary effort is normal.     Breath sounds: Normal breath sounds. No wheezing or rhonchi.  Chest:     Chest wall: No tenderness.  Abdominal:     General: Bowel sounds are normal.     Palpations: Abdomen is soft.     Tenderness: There is no abdominal tenderness.  There is no right CVA tenderness or left CVA tenderness.  Musculoskeletal:        General: Normal range of motion.     Cervical back: Normal range of motion.     Right lower leg: No edema.     Left lower leg: No edema.  Skin:    General: Skin is warm and dry.  Neurological:     General: No focal deficit present.     Mental Status: She is alert and oriented to person, place, and time.  Psychiatric:        Mood and Affect: Mood normal.        Behavior: Behavior normal.      No results found for any visits on 01/09/23.  No results found for this or any previous visit (from the past 2160 hours).    Assessment & Plan:  Check labs today. Prescription sent for prednisone Dosepak and Augmentin. Problem List Items Addressed This Visit     COPD (chronic obstructive pulmonary disease) (HCC)   Relevant Medications   methylPREDNISolone (MEDROL DOSEPAK) 4 MG TBPK tablet   Congestive heart failure (HCC)   Relevant Orders   CBC with Diff   CMP14+EGFR   Depression with anxiety   Nicotine dependence   Seizure disorder (HCC)   Other Visit Diagnoses       Hypertension associated with diabetes (HCC)    -  Primary   Relevant Orders   CMP14+EGFR     Bronchitis       Relevant Medications   amoxicillin-clavulanate (AUGMENTIN) 500-125 MG tablet   methylPREDNISolone (MEDROL DOSEPAK) 4 MG TBPK tablet     Combined hyperlipidemia associated with type 2 diabetes mellitus (HCC)  Relevant Orders   CMP14+EGFR   Lipid Panel w/o Chol/HDL Ratio     Type 2 diabetes mellitus with vascular disease (HCC)       Relevant Orders   CBC with Diff   Hemoglobin A1c     Vitamin D2 deficiency       Relevant Orders   Vitamin D (25 hydroxy)       Return in about 10 days (around 01/19/2023).   Total time spent: 30 minutes  Margaretann Loveless, MD  01/09/2023   This document may have been prepared by [facility] Voice Recognition software and as such may include unintentional dictation errors.

## 2023-01-10 LAB — CBC WITH DIFFERENTIAL/PLATELET
Basophils Absolute: 0 10*3/uL (ref 0.0–0.2)
Basos: 1 %
EOS (ABSOLUTE): 0.1 10*3/uL (ref 0.0–0.4)
Eos: 1 %
Hematocrit: 42.4 % (ref 34.0–46.6)
Hemoglobin: 13.8 g/dL (ref 11.1–15.9)
Immature Grans (Abs): 0 10*3/uL (ref 0.0–0.1)
Immature Granulocytes: 0 %
Lymphocytes Absolute: 1.5 10*3/uL (ref 0.7–3.1)
Lymphs: 25 %
MCH: 30.7 pg (ref 26.6–33.0)
MCHC: 32.5 g/dL (ref 31.5–35.7)
MCV: 94 fL (ref 79–97)
Monocytes Absolute: 0.4 10*3/uL (ref 0.1–0.9)
Monocytes: 6 %
Neutrophils Absolute: 4 10*3/uL (ref 1.4–7.0)
Neutrophils: 67 %
Platelets: 205 10*3/uL (ref 150–450)
RBC: 4.5 x10E6/uL (ref 3.77–5.28)
RDW: 12.1 % (ref 11.7–15.4)
WBC: 5.9 10*3/uL (ref 3.4–10.8)

## 2023-01-10 LAB — VITAMIN D 25 HYDROXY (VIT D DEFICIENCY, FRACTURES): Vit D, 25-Hydroxy: 34.7 ng/mL (ref 30.0–100.0)

## 2023-01-10 LAB — CMP14+EGFR
ALT: 10 [IU]/L (ref 0–32)
AST: 16 [IU]/L (ref 0–40)
Albumin: 4.3 g/dL (ref 3.9–4.9)
Alkaline Phosphatase: 101 [IU]/L (ref 44–121)
BUN/Creatinine Ratio: 12 (ref 12–28)
BUN: 11 mg/dL (ref 8–27)
Bilirubin Total: 0.2 mg/dL (ref 0.0–1.2)
CO2: 24 mmol/L (ref 20–29)
Calcium: 9.6 mg/dL (ref 8.7–10.3)
Chloride: 103 mmol/L (ref 96–106)
Creatinine, Ser: 0.92 mg/dL (ref 0.57–1.00)
Globulin, Total: 2.7 g/dL (ref 1.5–4.5)
Glucose: 143 mg/dL — ABNORMAL HIGH (ref 70–99)
Potassium: 4.3 mmol/L (ref 3.5–5.2)
Sodium: 143 mmol/L (ref 134–144)
Total Protein: 7 g/dL (ref 6.0–8.5)
eGFR: 67 mL/min/{1.73_m2} (ref >59)

## 2023-01-10 LAB — LIPID PANEL W/O CHOL/HDL RATIO
Cholesterol, Total: 134 mg/dL (ref 100–199)
HDL: 67 mg/dL (ref >39)
LDL Chol Calc (NIH): 51 mg/dL (ref 0–99)
Triglycerides: 84 mg/dL (ref 0–149)
VLDL Cholesterol Cal: 16 mg/dL (ref 5–40)

## 2023-01-10 LAB — HEMOGLOBIN A1C
Est. average glucose Bld gHb Est-mCnc: 134 mg/dL
Hgb A1c MFr Bld: 6.3 % — ABNORMAL HIGH (ref 4.8–5.6)

## 2023-01-17 ENCOUNTER — Other Ambulatory Visit: Payer: Self-pay

## 2023-01-17 MED ORDER — TIZANIDINE HCL 4 MG PO TABS: 4.0000 mg | ORAL_TABLET | Freq: Four times a day (QID) | ORAL | 1 refills | Status: DC | PRN

## 2023-01-19 DIAGNOSIS — J449 Chronic obstructive pulmonary disease, unspecified: Secondary | ICD-10-CM | POA: Diagnosis not present

## 2023-01-20 ENCOUNTER — Other Ambulatory Visit: Payer: Self-pay | Admitting: Internal Medicine

## 2023-01-20 ENCOUNTER — Ambulatory Visit (INDEPENDENT_AMBULATORY_CARE_PROVIDER_SITE_OTHER): Payer: 59 | Admitting: Internal Medicine

## 2023-01-20 ENCOUNTER — Encounter: Payer: Self-pay | Admitting: Internal Medicine

## 2023-01-20 ENCOUNTER — Ambulatory Visit
Admission: RE | Admit: 2023-01-20 | Discharge: 2023-01-20 | Disposition: A | Discharge status: Home / Self Care | Payer: 59 | Attending: Internal Medicine | Admitting: Internal Medicine

## 2023-01-20 ENCOUNTER — Ambulatory Visit
Admission: RE | Admit: 2023-01-20 | Discharge: 2023-01-20 | Disposition: A | Discharge status: Home / Self Care | Payer: 59 | Source: Ambulatory Visit | Attending: Internal Medicine | Admitting: Internal Medicine

## 2023-01-20 VITALS — BP 134/78 | HR 103 | Ht 63.0 in | Wt 163.8 lb

## 2023-01-20 DIAGNOSIS — E1169 Type 2 diabetes mellitus with other specified complication: Secondary | ICD-10-CM | POA: Insufficient documentation

## 2023-01-20 DIAGNOSIS — E1159 Type 2 diabetes mellitus with other circulatory complications: Secondary | ICD-10-CM | POA: Diagnosis not present

## 2023-01-20 DIAGNOSIS — E782 Mixed hyperlipidemia: Secondary | ICD-10-CM | POA: Diagnosis not present

## 2023-01-20 DIAGNOSIS — I152 Hypertension secondary to endocrine disorders: Secondary | ICD-10-CM

## 2023-01-20 DIAGNOSIS — K449 Diaphragmatic hernia without obstruction or gangrene: Secondary | ICD-10-CM | POA: Diagnosis not present

## 2023-01-20 DIAGNOSIS — R051 Acute cough: Secondary | ICD-10-CM | POA: Insufficient documentation

## 2023-01-20 DIAGNOSIS — R0602 Shortness of breath: Secondary | ICD-10-CM | POA: Diagnosis not present

## 2023-01-20 DIAGNOSIS — J441 Chronic obstructive pulmonary disease with (acute) exacerbation: Secondary | ICD-10-CM | POA: Insufficient documentation

## 2023-01-20 DIAGNOSIS — J9811 Atelectasis: Secondary | ICD-10-CM | POA: Diagnosis not present

## 2023-01-20 DIAGNOSIS — R059 Cough, unspecified: Secondary | ICD-10-CM | POA: Diagnosis not present

## 2023-01-20 LAB — POCT CBG (FASTING - GLUCOSE)-MANUAL ENTRY: Glucose Fasting, POC: 147 mg/dL — AB (ref 70–99)

## 2023-01-20 MED ORDER — PREDNISONE 20 MG PO TABS: 40.0000 mg | ORAL_TABLET | Freq: Every day | ORAL | 0 refills | Status: DC

## 2023-01-20 NOTE — Progress Notes (Signed)
Established Patient Office Visit  Subjective:  Patient ID: Angela Daniel, female    DOB: 01/26/1955  Age: 67 y.o. MRN: [id number]  Chief Complaint  Patient presents with   Follow-up    10 day follow up    Patient comes in for follow-up today, accompanied by her friend.  She has completed the prednisone Dosepak as well as antibiotics.  Although better than before but she still has heavy cough which is wet but states she is unable to bring up sputum.  She continues to smoke and is not ready to quit yet.  Denies fever or chills but feels tired. No chest pain but has some difficulty with deep breathing.   Will check chest x-ray today. Start Breztri inhaler samples given-    No other concerns at this time.   Past Medical History:  Diagnosis Date   Congestive heart failure (HCC)    COPD (chronic obstructive pulmonary disease) (HCC)    Degenerative joint disease    Diabetes mellitus without complication (HCC)    Fibromyalgia    GERD (gastroesophageal reflux disease)    Hypertension    Scoliosis    Sleep apnea     Past Surgical History:  Procedure Laterality Date   ABDOMINAL HYSTERECTOMY     APPENDECTOMY     CESAREAN SECTION     x3   CHOLECYSTECTOMY     HIP SURGERY Left    joint replacement   KYPHOPLASTY N/A 05/18/2019   Procedure: T6 KYPHOPLASTY;  Surgeon: Kennedy Bucker, MD;  Location: [facility] ORS;  Service: Orthopedics;  Laterality: N/A;    Social History   Socioeconomic History   Marital status: Single    Spouse name: Not on file   Number of children: Not on file   Years of education: Not on file   Highest education level: Not on file  Occupational History   Not on file  Tobacco Use   Smoking status: Every Day    Current packs/day: 0.50    Types: Cigarettes   Smokeless tobacco: Never  Vaping Use   Vaping status: Never Used  Substance and Sexual Activity   Alcohol use: No   Drug use: No   Sexual activity: Not on file  Other Topics Concern   Not on file   Social History Narrative   Not on file   Social Drivers of Health   Financial Resource Strain: Not on file  Food Insecurity: Not on file  Transportation Needs: Not on file  Physical Activity: Not on file  Stress: Not on file  Social Connections: Not on file  Intimate Partner Violence: Not on file    Family History  Problem Relation Age of Onset   CVA Mother    Lung cancer Mother    Diabetes Mother    Heart attack Father    Hypertension Father    Lung cancer Father    Asthma Father    Breast cancer Neg Hx     Allergies  Allergen Reactions   Tramadol Hives   Tylenol [Acetaminophen] Nausea And Vomiting   Vicodin [Hydrocodone-Acetaminophen] Hives    Outpatient Medications Prior to Visit  Medication Sig   albuterol (VENTOLIN HFA) 108 (90 Base) MCG/ACT inhaler Inhale 2 puffs into the lungs every 4 (four) hours as needed.   alprazolam (XANAX) 2 MG tablet Take 2 mg by mouth 3 (three) times daily.   amphetamine-dextroamphetamine (ADDERALL) 20 MG tablet Take 20 mg by mouth once a week.   amphetamine-dextroamphetamine (ADDERALL) 30 MG  tablet Take 1 tablet by mouth 2 (two) times daily.   ARIPiprazole (ABILIFY) 15 MG tablet Take 15 mg by mouth once.   aspirin EC 81 MG tablet Take 81 mg by mouth daily.   blood glucose meter kit and supplies KIT Dispense based on patient and insurance preference. Use up to four times daily as directed. Check blood sugar three times a day   buprenorphine (SUBUTEX) 8 MG SUBL SL tablet Place 8 mg under the tongue 3 (three) times daily.   Cholecalciferol (VITAMIN D3) 50 MCG (2000 UT) TABS Take 2,000 mg by mouth daily.   Cyanocobalamin 2500 MCG CHEW Chew 2,500 mcg by mouth daily.   diclofenac (VOLTAREN) 50 MG EC tablet Take 50 mg by mouth 2 (two) times daily as needed for mild pain.   esomeprazole (NEXIUM) 40 MG capsule Take 40 mg by mouth daily.   fluticasone (FLONASE) 50 MCG/ACT nasal spray Place 1 spray into both nostrils at bedtime.    Fluticasone-Salmeterol (ADVAIR) 250-50 MCG/DOSE AEPB Inhale 1 puff into the lungs 2 (two) times daily.   gabapentin (NEURONTIN) 600 MG tablet Take 600 mg by mouth daily.   gabapentin (NEURONTIN) 800 MG tablet Take 800 mg by mouth 3 (three) times daily.   isosorbide mononitrate (IMDUR) 30 MG 24 hr tablet TAKE 1 TABLET BY MOUTH ONCE DAILY   levETIRAcetam (KEPPRA) 500 MG tablet TAKE 1 TABLET BY MOUTH TWICE DAILY   lisinopril (ZESTRIL) 10 MG tablet Take 10 mg by mouth daily.   magnesium oxide (MAG-OX) 400 (240 Mg) MG tablet Take 1 tablet (400 mg total) by mouth 2 (two) times daily.   metFORMIN (GLUCOPHAGE) 500 MG tablet Take 500 mg by mouth 2 (two) times daily with a meal.    metoprolol succinate (TOPROL-XL) 50 MG 24 hr tablet TAKE 1 TABLET BY MOUTH ONCE DAILY   Omega-3 Fatty Acids (FISH OIL) 500 MG CAPS Take 1 capsule by mouth 1 day or 1 dose.   ondansetron (ZOFRAN-ODT) 4 MG disintegrating tablet Take 4 mg by mouth every 6 (six) hours as needed.   rosuvastatin (CRESTOR) 20 MG tablet TAKE 1 TABLET BY MOUTH ONCE EVERY EVENING   SPIRIVA HANDIHALER 18 MCG inhalation capsule Place 18 mcg into inhaler and inhale daily.   spironolactone (ALDACTONE) 25 MG tablet TAKE 1/2 TABLET BY MOUTH EVERY DAY   tiZANidine (ZANAFLEX) 4 MG tablet Take 1 tablet (4 mg total) by mouth every 6 (six) hours as needed.   torsemide (DEMADEX) 10 MG tablet Take 2 tab bid   Vitamin E 400 units TABS Take 400 Units by mouth daily.   amoxicillin-clavulanate (AUGMENTIN) 500-125 MG tablet Take 1 tablet by mouth 2 (two) times daily. (Patient not taking: Reported on 01/20/2023)   methylPREDNISolone (MEDROL DOSEPAK) 4 MG TBPK tablet Use as directed (Patient not taking: Reported on 01/20/2023)   No facility-administered medications prior to visit.    Review of Systems  Constitutional:  Positive for malaise/fatigue. Negative for chills, diaphoresis, fever and weight loss.  HENT:  Positive for congestion and hearing loss. Negative for  sore throat.   Eyes: Negative.   Respiratory:  Positive for cough, sputum production, shortness of breath and wheezing.   Cardiovascular: Negative.  Negative for chest pain, palpitations and leg swelling.  Gastrointestinal: Negative.  Negative for abdominal pain, constipation, diarrhea, heartburn, nausea and vomiting.  Genitourinary: Negative.  Negative for dysuria and flank pain.  Musculoskeletal: Negative.  Negative for joint pain and myalgias.  Skin: Negative.   Neurological: Negative.  Negative  for dizziness and headaches.  Endo/Heme/Allergies: Negative.   Psychiatric/Behavioral: Negative.  Negative for depression and suicidal ideas. The patient is not nervous/anxious.        Objective:   BP 134/78   Pulse (!) 103   Ht 5\' 3"  (1.6 m)   Wt 163 lb 12.8 oz (74.3 kg)   SpO2 (!) 87%   BMI 29.02 kg/m   Vitals:   01/20/23 1302  BP: 134/78  Pulse: (!) 103  Height: 5\' 3"  (1.6 m)  Weight: 163 lb 12.8 oz (74.3 kg)  SpO2: (!) 87%  BMI (Calculated): 29.02    Physical Exam Vitals and nursing note reviewed.  Constitutional:      General: She is not in acute distress.    Appearance: Normal appearance.  HENT:     Head: Normocephalic and atraumatic.     Nose: Nose normal.     Mouth/Throat:     Mouth: Mucous membranes are moist.     Pharynx: Oropharynx is clear.  Eyes:     Conjunctiva/sclera: Conjunctivae normal.     Pupils: Pupils are equal, round, and reactive to light.  Cardiovascular:     Rate and Rhythm: Normal rate and regular rhythm.     Pulses: Normal pulses.     Heart sounds: Normal heart sounds. No murmur heard. Pulmonary:     Effort: Pulmonary effort is normal.     Breath sounds: Wheezing and rhonchi present. No rales.  Chest:     Chest wall: No tenderness.  Abdominal:     General: Bowel sounds are normal.     Palpations: Abdomen is soft.     Tenderness: There is no abdominal tenderness. There is no right CVA tenderness or left CVA tenderness.   Musculoskeletal:        General: Normal range of motion.     Cervical back: Normal range of motion.     Right lower leg: No edema.     Left lower leg: No edema.  Skin:    General: Skin is warm and dry.  Neurological:     General: No focal deficit present.     Mental Status: She is alert and oriented to person, place, and time.  Psychiatric:        Mood and Affect: Mood normal.        Behavior: Behavior normal.      Results for orders placed or performed in visit on 01/20/23  POCT CBG (Fasting - Glucose)  Result Value Ref Range   Glucose Fasting, POC 147 (A) 70 - 99 mg/dL    Recent Results (from the past 2160 hours)  CBC with Diff     Status: None   Collection Time: 01/09/23  1:34 PM  Result Value Ref Range   WBC 5.9 3.4 - 10.8 x10E3/uL   RBC 4.50 3.77 - 5.28 x10E6/uL   Hemoglobin 13.8 11.1 - 15.9 g/dL   Hematocrit 52.8 41.3 - 46.6 %   MCV 94 79 - 97 fL   MCH 30.7 26.6 - 33.0 pg   MCHC 32.5 31.5 - 35.7 g/dL   RDW 24.4 01.0 - 27.2 %   Platelets 205 150 - 450 x10E3/uL   Neutrophils 67 Not Estab. %   Lymphs 25 Not Estab. %   Monocytes 6 Not Estab. %   Eos 1 Not Estab. %   Basos 1 Not Estab. %   Neutrophils Absolute 4.0 1.4 - 7.0 x10E3/uL   Lymphocytes Absolute 1.5 0.7 - 3.1 x10E3/uL   Monocytes  Absolute 0.4 0.1 - 0.9 x10E3/uL   EOS (ABSOLUTE) 0.1 0.0 - 0.4 x10E3/uL   Basophils Absolute 0.0 0.0 - 0.2 x10E3/uL   Immature Granulocytes 0 Not Estab. %   Immature Grans (Abs) 0.0 0.0 - 0.1 x10E3/uL  CMP14+EGFR     Status: Abnormal   Collection Time: 01/09/23  1:34 PM  Result Value Ref Range   Glucose 143 (H) 70 - 99 mg/dL   BUN 11 8 - 27 mg/dL   Creatinine, Ser 0.98 0.57 - 1.00 mg/dL   eGFR 67 >11 BJ/YNW/2.95   BUN/Creatinine Ratio 12 12 - 28   Sodium 143 134 - 144 mmol/L   Potassium 4.3 3.5 - 5.2 mmol/L   Chloride 103 96 - 106 mmol/L   CO2 24 20 - 29 mmol/L   Calcium 9.6 8.7 - 10.3 mg/dL   Total Protein 7.0 6.0 - 8.5 g/dL   Albumin 4.3 3.9 - 4.9 g/dL   Globulin,  Total 2.7 1.5 - 4.5 g/dL   Bilirubin Total 0.2 0.0 - 1.2 mg/dL   Alkaline Phosphatase 101 44 - 121 IU/L   AST 16 0 - 40 IU/L   ALT 10 0 - 32 IU/L  Vitamin D (25 hydroxy)     Status: None   Collection Time: 01/09/23  1:34 PM  Result Value Ref Range   Vit D, 25-Hydroxy 34.7 30.0 - 100.0 ng/mL    Comment: Vitamin D deficiency has been defined by the Institute of Medicine and an Endocrine Society practice guideline as a level of serum 25-OH vitamin D less than 20 ng/mL (1,2). The Endocrine Society went on to further define vitamin D insufficiency as a level between 21 and 29 ng/mL (2). 1. IOM (Institute of Medicine). 2010. Dietary reference    intakes for calcium and D. [place]: The    [employer]. 2. Holick MF, Binkley Bolivar, Bischoff-Ferrari HA, et al.    Evaluation, treatment, and prevention of vitamin D    deficiency: an Endocrine Society clinical practice    guideline. JCEM. 2011 Jul; 96(7):1911-30.   Lipid Panel w/o Chol/HDL Ratio     Status: None   Collection Time: 01/09/23  1:34 PM  Result Value Ref Range   Cholesterol, Total 134 100 - 199 mg/dL   Triglycerides 84 0 - 149 mg/dL   HDL 67 >62 mg/dL   VLDL Cholesterol Cal 16 5 - 40 mg/dL   LDL Chol Calc (NIH) 51 0 - 99 mg/dL  Hemoglobin Z3Y     Status: Abnormal   Collection Time: 01/09/23  1:34 PM  Result Value Ref Range   Hgb A1c MFr Bld 6.3 (H) 4.8 - 5.6 %    Comment:          Prediabetes: 5.7 - 6.4          Diabetes: >6.4          Glycemic control for adults with diabetes: <7.0    Est. average glucose Bld gHb Est-mCnc 134 mg/dL  POCT CBG (Fasting - Glucose)     Status: Abnormal   Collection Time: 01/20/23  1:06 PM  Result Value Ref Range   Glucose Fasting, POC 147 (A) 70 - 99 mg/dL      Assessment & Plan:  Chest x-ray stat. Will adjust medications accordingly. Present tree samples given.  Patient advised to quit smoking. Problem List Items Addressed This Visit     Hypertension associated with  diabetes (HCC) - Primary   Type 2 diabetes mellitus with vascular  disease (HCC)   Relevant Orders   POCT CBG (Fasting - Glucose) (Completed)   Acute cough   Relevant Orders   DG Chest 2 View   Combined hyperlipidemia associated with type 2 diabetes mellitus (HCC)   COPD exacerbation (HCC)    Return in about 2 weeks (around 02/03/2023).   Total time spent: 30 minutes  Margaretann Loveless, MD  01/20/2023   This document may have been prepared by [facility] Voice Recognition software and as such may include unintentional dictation errors.

## 2023-01-21 ENCOUNTER — Other Ambulatory Visit: Payer: Self-pay

## 2023-01-21 ENCOUNTER — Emergency Department: Payer: 59

## 2023-01-21 ENCOUNTER — Encounter: Payer: Self-pay | Admitting: Emergency Medicine

## 2023-01-21 DIAGNOSIS — Z5321 Procedure and treatment not carried out due to patient leaving prior to being seen by health care provider: Secondary | ICD-10-CM | POA: Diagnosis not present

## 2023-01-21 DIAGNOSIS — I499 Cardiac arrhythmia, unspecified: Secondary | ICD-10-CM | POA: Diagnosis not present

## 2023-01-21 DIAGNOSIS — M19012 Primary osteoarthritis, left shoulder: Secondary | ICD-10-CM | POA: Diagnosis not present

## 2023-01-21 DIAGNOSIS — M25512 Pain in left shoulder: Secondary | ICD-10-CM | POA: Diagnosis not present

## 2023-01-21 DIAGNOSIS — R0902 Hypoxemia: Secondary | ICD-10-CM | POA: Diagnosis not present

## 2023-01-21 DIAGNOSIS — Z743 Need for continuous supervision: Secondary | ICD-10-CM | POA: Diagnosis not present

## 2023-01-21 DIAGNOSIS — M542 Cervicalgia: Secondary | ICD-10-CM | POA: Diagnosis not present

## 2023-01-21 LAB — CBC WITH DIFFERENTIAL/PLATELET
Abs Immature Granulocytes: 0.05 10*3/uL (ref 0.00–0.07)
Basophils Absolute: 0 10*3/uL (ref 0.0–0.1)
Basophils Relative: 0 %
Eosinophils Absolute: 0.1 10*3/uL (ref 0.0–0.5)
Eosinophils Relative: 1 %
HCT: 43.3 % (ref 36.0–46.0)
Hemoglobin: 14.3 g/dL (ref 12.0–15.0)
Immature Granulocytes: 0 %
Lymphocytes Relative: 11 %
Lymphs Abs: 1.2 10*3/uL (ref 0.7–4.0)
MCH: 31.4 pg (ref 26.0–34.0)
MCHC: 33 g/dL (ref 30.0–36.0)
MCV: 95.2 fL (ref 80.0–100.0)
Monocytes Absolute: 0.7 10*3/uL (ref 0.1–1.0)
Monocytes Relative: 6 %
Neutro Abs: 9.1 10*3/uL — ABNORMAL HIGH (ref 1.7–7.7)
Neutrophils Relative %: 82 %
Platelets: 190 10*3/uL (ref 150–400)
RBC: 4.55 MIL/uL (ref 3.87–5.11)
RDW: 13.4 % (ref 11.5–15.5)
WBC: 11.2 10*3/uL — ABNORMAL HIGH (ref 4.0–10.5)
nRBC: 0 % (ref 0.0–0.2)

## 2023-01-21 LAB — BASIC METABOLIC PANEL
Anion gap: 14 (ref 5–15)
BUN: 18 mg/dL (ref 8–23)
CO2: 28 mmol/L (ref 22–32)
Calcium: 9.2 mg/dL (ref 8.9–10.3)
Chloride: 94 mmol/L — ABNORMAL LOW (ref 98–111)
Creatinine, Ser: 0.73 mg/dL (ref 0.44–1.00)
GFR, Estimated: >60 mL/min (ref >60)
Glucose, Bld: 109 mg/dL — ABNORMAL HIGH (ref 70–99)
Potassium: 4.2 mmol/L (ref 3.5–5.1)
Sodium: 136 mmol/L (ref 135–145)

## 2023-01-21 LAB — TROPONIN I (HIGH SENSITIVITY): Troponin I (High Sensitivity): 8 ng/L (ref <18)

## 2023-01-21 NOTE — ED Triage Notes (Signed)
Pt arrived via [facility] from home, neck pain, stiffness, EKG negative,   152/79 96%  P115 98.5  Taking amoxicillin for lung infection CXR neg yesterday

## 2023-01-21 NOTE — ED Triage Notes (Signed)
Patient to ED via POV for neck pain that radiates into left shoulder. Ongoing 3-4 days. Denies injury. Has taken ibuprofen with no relief. Pt appears drowsy in triage and falling asleep.

## 2023-01-21 NOTE — ED Provider Triage Note (Signed)
 Emergency Medicine Provider Triage Evaluation Note  Angela Daniel , a 67 y.o. female  was evaluated in triage.  Pt complains of left cervical pain.  Pain radiates to her left shoulder.  Patient is slow answering questions  Review of Systems  Positive:  Negative:   Physical Exam  BP (!) 168/98 (BP Location: Left Arm)   Pulse (!) 123   Temp 98.8 F (37.1 C) (Oral)   Resp 18   SpO2 95%  Gen:   Awake, no distress, oriented x , talking with closed eyes Resp:  Normal effort  MSK:   Moves extremities without difficulty  Other:    Medical Decision Making  Medically screening exam initiated at 6:27 PM.  Appropriate orders placed.  Angela Daniel was informed that the remainder of the evaluation will be completed by another provider, this initial triage assessment does not replace that evaluation, and the importance of remaining in the ED until their evaluation is complete.  Due to to unknown normal mental state patient will have CBC CMP and x-ray of cervical spine and left shoulder   Angela Kast, PA-C 01/21/23 1829

## 2023-01-22 ENCOUNTER — Emergency Department
Admission: EM | Admit: 2023-01-22 | Discharge: 2023-01-22 | Discharge status: Against Medical Advice | Payer: 59 | Attending: Emergency Medicine | Admitting: Emergency Medicine

## 2023-01-22 DIAGNOSIS — M542 Cervicalgia: Secondary | ICD-10-CM | POA: Diagnosis not present

## 2023-01-22 LAB — TROPONIN I (HIGH SENSITIVITY): Troponin I (High Sensitivity): 9 ng/L (ref <18)

## 2023-01-24 ENCOUNTER — Ambulatory Visit (INDEPENDENT_AMBULATORY_CARE_PROVIDER_SITE_OTHER): Payer: 59 | Admitting: Internal Medicine

## 2023-01-24 ENCOUNTER — Other Ambulatory Visit: Payer: Self-pay | Admitting: Cardiovascular Disease

## 2023-01-24 ENCOUNTER — Encounter: Payer: Self-pay | Admitting: Internal Medicine

## 2023-01-24 ENCOUNTER — Other Ambulatory Visit: Payer: Self-pay | Admitting: Internal Medicine

## 2023-01-24 VITALS — BP 160/90 | HR 129 | Ht 63.0 in | Wt 155.8 lb

## 2023-01-24 DIAGNOSIS — I10 Essential (primary) hypertension: Secondary | ICD-10-CM

## 2023-01-24 DIAGNOSIS — F17200 Nicotine dependence, unspecified, uncomplicated: Secondary | ICD-10-CM | POA: Diagnosis not present

## 2023-01-24 DIAGNOSIS — J449 Chronic obstructive pulmonary disease, unspecified: Secondary | ICD-10-CM

## 2023-01-24 DIAGNOSIS — G40909 Epilepsy, unspecified, not intractable, without status epilepticus: Secondary | ICD-10-CM

## 2023-01-24 DIAGNOSIS — E782 Mixed hyperlipidemia: Secondary | ICD-10-CM | POA: Diagnosis not present

## 2023-01-24 DIAGNOSIS — E1169 Type 2 diabetes mellitus with other specified complication: Secondary | ICD-10-CM | POA: Diagnosis not present

## 2023-01-24 DIAGNOSIS — R000 Tachycardia, unspecified: Secondary | ICD-10-CM | POA: Diagnosis not present

## 2023-01-24 DIAGNOSIS — E1159 Type 2 diabetes mellitus with other circulatory complications: Secondary | ICD-10-CM

## 2023-01-24 LAB — GLUCOSE, POCT (MANUAL RESULT ENTRY): POC Glucose: 117 mg/dL — AB (ref 70–99)

## 2023-01-24 NOTE — Progress Notes (Signed)
 Established Patient Office Visit  Subjective:  Patient ID: Angela Daniel, female    DOB: March 13, 1955  Age: 68 y.o. MRN: [id number]  Chief Complaint  Patient presents with   Acute Visit    Low oxygen  levels    Patient comes in with complaints of chest tightness and mild shortness of breath, palpitations.  Her oxygen  saturation is 96% at room air.  Her pulse is fast at 129, but EKG is normal sinus rhythm at 119.  Patient has not taken her metoprolol  today, she says she takes it in the evening.  Also admits to drinking [facility] all day.  She does not like to drink water.  Patient advised that she has to stop drinking caffeinated products, and needs to drink water only. At her last visit she had a chest x-ray done which was negative for pneumonia, and a prednisone  burst was sent to the pharmacy which she has still not started yet.  Patient is accompanied by her friend who will take her to the pharmacy to pick up the prednisone  burst and start today. She was recently seen at the emergency room for shoulder and neck pain.  X-rays were negative and patient was discharged.  She does have muscle tightness of the back of her shoulder.  Advised massage and heat.    No other concerns at this time.   Past Medical History:  Diagnosis Date   Congestive heart failure (HCC)    COPD (chronic obstructive pulmonary disease) (HCC)    Degenerative joint disease    Diabetes mellitus without complication (HCC)    Fibromyalgia    GERD (gastroesophageal reflux disease)    Hypertension    Scoliosis    Sleep apnea     Past Surgical History:  Procedure Laterality Date   ABDOMINAL HYSTERECTOMY     APPENDECTOMY     CESAREAN SECTION     x3   CHOLECYSTECTOMY     HIP SURGERY Left    joint replacement   KYPHOPLASTY N/A 05/18/2019   Procedure: T6 KYPHOPLASTY;  Surgeon: Kathlynn Sharper, MD;  Location: [facility] ORS;  Service: Orthopedics;  Laterality: N/A;    Social History   Socioeconomic History   Marital  status: Single    Spouse name: Not on file   Number of children: Not on file   Years of education: Not on file   Highest education level: Not on file  Occupational History   Not on file  Tobacco Use   Smoking status: Every Day    Current packs/day: 0.50    Types: Cigarettes   Smokeless tobacco: Never  Vaping Use   Vaping status: Never Used  Substance and Sexual Activity   Alcohol use: No   Drug use: No   Sexual activity: Not on file  Other Topics Concern   Not on file  Social History Narrative   Not on file   Social Drivers of Health   Financial Resource Strain: Not on file  Food Insecurity: Not on file  Transportation Needs: Not on file  Physical Activity: Not on file  Stress: Not on file  Social Connections: Not on file  Intimate Partner Violence: Not on file    Family History  Problem Relation Age of Onset   CVA Mother    Lung cancer Mother    Diabetes Mother    Heart attack Father    Hypertension Father    Lung cancer Father    Asthma Father    Breast cancer Neg Hx  Allergies  Allergen Reactions   Tramadol Hives   Tylenol  [Acetaminophen ] Nausea And Vomiting   Vicodin [Hydrocodone -Acetaminophen ] Hives    Outpatient Medications Prior to Visit  Medication Sig   albuterol  (VENTOLIN  HFA) 108 (90 Base) MCG/ACT inhaler Inhale 2 puffs into the lungs every 4 (four) hours as needed.   alprazolam  (XANAX ) 2 MG tablet Take 2 mg by mouth 3 (three) times daily.   amphetamine -dextroamphetamine  (ADDERALL) 20 MG tablet Take 20 mg by mouth once a week.   amphetamine -dextroamphetamine  (ADDERALL) 30 MG tablet Take 1 tablet by mouth 2 (two) times daily.   ARIPiprazole  (ABILIFY ) 15 MG tablet Take 15 mg by mouth once.   aspirin  EC 81 MG tablet Take 81 mg by mouth daily.   blood glucose meter kit and supplies KIT Dispense based on patient and insurance preference. Use up to four times daily as directed. Check blood sugar three times a day   buprenorphine  (SUBUTEX ) 8 MG  SUBL SL tablet Place 8 mg under the tongue 3 (three) times daily.   Cholecalciferol  (VITAMIN D3) 50 MCG (2000 UT) TABS Take 2,000 mg by mouth daily.   Cyanocobalamin  2500 MCG CHEW Chew 2,500 mcg by mouth daily.   diclofenac  (VOLTAREN ) 50 MG EC tablet Take 50 mg by mouth 2 (two) times daily as needed for mild pain.   esomeprazole  (NEXIUM ) 40 MG capsule Take 40 mg by mouth daily.   fluticasone  (FLONASE ) 50 MCG/ACT nasal spray Place 1 spray into both nostrils at bedtime.   Fluticasone -Salmeterol (ADVAIR) 250-50 MCG/DOSE AEPB Inhale 1 puff into the lungs 2 (two) times daily.   gabapentin  (NEURONTIN ) 600 MG tablet Take 600 mg by mouth daily.   gabapentin  (NEURONTIN ) 800 MG tablet Take 800 mg by mouth 3 (three) times daily.   isosorbide  mononitrate (IMDUR ) 30 MG 24 hr tablet TAKE 1 TABLET BY MOUTH ONCE DAILY   levETIRAcetam  (KEPPRA ) 500 MG tablet TAKE 1 TABLET BY MOUTH TWICE DAILY   lisinopril  (ZESTRIL ) 10 MG tablet Take 10 mg by mouth daily.   magnesium  oxide (MAG-OX) 400 (240 Mg) MG tablet Take 1 tablet (400 mg total) by mouth 2 (two) times daily.   metFORMIN  (GLUCOPHAGE ) 500 MG tablet Take 500 mg by mouth 2 (two) times daily with a meal.    metoprolol  succinate (TOPROL -XL) 50 MG 24 hr tablet TAKE 1 TABLET BY MOUTH ONCE DAILY   Omega-3 Fatty Acids (FISH OIL) 500 MG CAPS Take 1 capsule by mouth 1 day or 1 dose.   ondansetron  (ZOFRAN -ODT) 4 MG disintegrating tablet Take 4 mg by mouth every 6 (six) hours as needed.   rosuvastatin  (CRESTOR ) 20 MG tablet TAKE 1 TABLET BY MOUTH ONCE EVERY EVENING   SPIRIVA  HANDIHALER 18 MCG inhalation capsule Place 18 mcg into inhaler and inhale daily.   spironolactone  (ALDACTONE ) 25 MG tablet TAKE 1/2 TABLET BY MOUTH EVERY DAY   tiZANidine  (ZANAFLEX ) 4 MG tablet Take 1 tablet (4 mg total) by mouth every 6 (six) hours as needed.   torsemide  (DEMADEX ) 20 MG tablet TAKE 1 TABLET BY MOUTH TWICE DAILY   Vitamin E  400 units TABS Take 400 Units by mouth daily.    amoxicillin -clavulanate (AUGMENTIN ) 500-125 MG tablet Take 1 tablet by mouth 2 (two) times daily. (Patient not taking: Reported on 01/24/2023)   methylPREDNISolone  (MEDROL  DOSEPAK) 4 MG TBPK tablet Use as directed (Patient not taking: Reported on 01/24/2023)   predniSONE  (DELTASONE ) 20 MG tablet Take 2 tablets (40 mg total) by mouth daily with breakfast. (Patient not taking: Reported  on 01/24/2023)   No facility-administered medications prior to visit.    Review of Systems  Constitutional:  Positive for malaise/fatigue and weight loss. Negative for chills and fever.  HENT: Negative.  Negative for congestion, sinus pain and sore throat.   Eyes: Negative.   Respiratory: Negative.  Negative for cough, shortness of breath and stridor.   Cardiovascular: Negative.  Negative for chest pain, palpitations and leg swelling.  Gastrointestinal: Negative.  Negative for abdominal pain, blood in stool, constipation, diarrhea, heartburn, melena, nausea and vomiting.  Genitourinary: Negative.  Negative for dysuria and flank pain.  Musculoskeletal: Negative.  Negative for joint pain and myalgias.  Skin: Negative.   Neurological: Negative.  Negative for dizziness and headaches.  Endo/Heme/Allergies: Negative.   Psychiatric/Behavioral: Negative.  Negative for depression and suicidal ideas. The patient is not nervous/anxious.        Objective:   BP (!) 160/90   Pulse (!) 129   Ht 5' 3 (1.6 m)   Wt 155 lb 12.8 oz (70.7 kg)   SpO2 96%   BMI 27.60 kg/m   Vitals:   01/24/23 1410  BP: (!) 160/90  Pulse: (!) 129  Height: 5' 3 (1.6 m)  Weight: 155 lb 12.8 oz (70.7 kg)  SpO2: 96%  BMI (Calculated): 27.61    Physical Exam Vitals and nursing note reviewed.  Constitutional:      Appearance: Normal appearance.  HENT:     Head: Normocephalic and atraumatic.     Nose: Nose normal.     Mouth/Throat:     Mouth: Mucous membranes are moist.     Pharynx: Oropharynx is clear.  Eyes:     Conjunctiva/sclera:  Conjunctivae normal.     Pupils: Pupils are equal, round, and reactive to light.  Cardiovascular:     Rate and Rhythm: Normal rate and regular rhythm.     Pulses: Normal pulses.     Heart sounds: Normal heart sounds. No murmur heard. Pulmonary:     Effort: Pulmonary effort is normal.     Breath sounds: Normal breath sounds. No wheezing.  Abdominal:     General: Bowel sounds are normal.     Palpations: Abdomen is soft.     Tenderness: There is no abdominal tenderness. There is no right CVA tenderness or left CVA tenderness.  Musculoskeletal:        General: Normal range of motion.     Cervical back: Normal range of motion.     Right lower leg: No edema.     Left lower leg: No edema.  Skin:    General: Skin is warm and dry.  Neurological:     General: No focal deficit present.     Mental Status: She is alert and oriented to person, place, and time.  Psychiatric:        Mood and Affect: Mood normal.        Behavior: Behavior normal.      Results for orders placed or performed in visit on 01/24/23  POCT Glucose (CBG)  Result Value Ref Range   POC Glucose 117 (A) 70 - 99 mg/dl    Recent Results (from the past 2160 hours)  CBC with Diff     Status: None   Collection Time: 01/09/23  1:34 PM  Result Value Ref Range   WBC 5.9 3.4 - 10.8 x10E3/uL   RBC 4.50 3.77 - 5.28 x10E6/uL   Hemoglobin 13.8 11.1 - 15.9 g/dL   Hematocrit 57.5 65.9 - 46.6 %  MCV 94 79 - 97 fL   MCH 30.7 26.6 - 33.0 pg   MCHC 32.5 31.5 - 35.7 g/dL   RDW 87.8 88.2 - 84.5 %   Platelets 205 150 - 450 x10E3/uL   Neutrophils 67 Not Estab. %   Lymphs 25 Not Estab. %   Monocytes 6 Not Estab. %   Eos 1 Not Estab. %   Basos 1 Not Estab. %   Neutrophils Absolute 4.0 1.4 - 7.0 x10E3/uL   Lymphocytes Absolute 1.5 0.7 - 3.1 x10E3/uL   Monocytes Absolute 0.4 0.1 - 0.9 x10E3/uL   EOS (ABSOLUTE) 0.1 0.0 - 0.4 x10E3/uL   Basophils Absolute 0.0 0.0 - 0.2 x10E3/uL   Immature Granulocytes 0 Not Estab. %   Immature  Grans (Abs) 0.0 0.0 - 0.1 x10E3/uL  CMP14+EGFR     Status: Abnormal   Collection Time: 01/09/23  1:34 PM  Result Value Ref Range   Glucose 143 (H) 70 - 99 mg/dL   BUN 11 8 - 27 mg/dL   Creatinine, Ser 9.07 0.57 - 1.00 mg/dL   eGFR 67 >40 fO/fpw/8.26   BUN/Creatinine Ratio 12 12 - 28   Sodium 143 134 - 144 mmol/L   Potassium 4.3 3.5 - 5.2 mmol/L   Chloride 103 96 - 106 mmol/L   CO2 24 20 - 29 mmol/L   Calcium  9.6 8.7 - 10.3 mg/dL   Total Protein 7.0 6.0 - 8.5 g/dL   Albumin 4.3 3.9 - 4.9 g/dL   Globulin, Total 2.7 1.5 - 4.5 g/dL   Bilirubin Total 0.2 0.0 - 1.2 mg/dL   Alkaline Phosphatase 101 44 - 121 IU/L   AST 16 0 - 40 IU/L   ALT 10 0 - 32 IU/L  Vitamin D  (25 hydroxy)     Status: None   Collection Time: 01/09/23  1:34 PM  Result Value Ref Range   Vit D, 25-Hydroxy 34.7 30.0 - 100.0 ng/mL    Comment: Vitamin D  deficiency has been defined by the Institute of Medicine and an Endocrine Society practice guideline as a level of serum 25-OH vitamin D  less than 20 ng/mL (1,2). The Endocrine Society went on to further define vitamin D  insufficiency as a level between 21 and 29 ng/mL (2). 1. IOM (Institute of Medicine). 2010. Dietary reference    intakes for calcium  and D. [place]: The    [employer]. 2. Holick MF, Binkley Etna, Bischoff-Ferrari HA, et al.    Evaluation, treatment, and prevention of vitamin D     deficiency: an Endocrine Society clinical practice    guideline. JCEM. 2011 Jul; 96(7):1911-30.   Lipid Panel w/o Chol/HDL Ratio     Status: None   Collection Time: 01/09/23  1:34 PM  Result Value Ref Range   Cholesterol, Total 134 100 - 199 mg/dL   Triglycerides 84 0 - 149 mg/dL   HDL 67 >60 mg/dL   VLDL Cholesterol Cal 16 5 - 40 mg/dL   LDL Chol Calc (NIH) 51 0 - 99 mg/dL  Hemoglobin J8r     Status: Abnormal   Collection Time: 01/09/23  1:34 PM  Result Value Ref Range   Hgb A1c MFr Bld 6.3 (H) 4.8 - 5.6 %    Comment:          Prediabetes: 5.7 -  6.4          Diabetes: >6.4          Glycemic control for adults with diabetes: <7.0    Est.  average glucose Bld gHb Est-mCnc 134 mg/dL  POCT CBG (Fasting - Glucose)     Status: Abnormal   Collection Time: 01/20/23  1:06 PM  Result Value Ref Range   Glucose Fasting, POC 147 (A) 70 - 99 mg/dL  CBC with Differential     Status: Abnormal   Collection Time: 01/21/23  9:20 PM  Result Value Ref Range   WBC 11.2 (H) 4.0 - 10.5 K/uL   RBC 4.55 3.87 - 5.11 MIL/uL   Hemoglobin 14.3 12.0 - 15.0 g/dL   HCT 56.6 63.9 - 53.9 %   MCV 95.2 80.0 - 100.0 fL   MCH 31.4 26.0 - 34.0 pg   MCHC 33.0 30.0 - 36.0 g/dL   RDW 86.5 88.4 - 84.4 %   Platelets 190 150 - 400 K/uL   nRBC 0.0 0.0 - 0.2 %   Neutrophils Relative % 82 %   Neutro Abs 9.1 (H) 1.7 - 7.7 K/uL   Lymphocytes Relative 11 %   Lymphs Abs 1.2 0.7 - 4.0 K/uL   Monocytes Relative 6 %   Monocytes Absolute 0.7 0.1 - 1.0 K/uL   Eosinophils Relative 1 %   Eosinophils Absolute 0.1 0.0 - 0.5 K/uL   Basophils Relative 0 %   Basophils Absolute 0.0 0.0 - 0.1 K/uL   Immature Granulocytes 0 %   Abs Immature Granulocytes 0.05 0.00 - 0.07 K/uL    Comment: Performed at [facility], [address] [place], [place] [address]  Basic metabolic panel     Status: Abnormal   Collection Time: 01/21/23  9:20 PM  Result Value Ref Range   Sodium 136 135 - 145 mmol/L   Potassium 4.2 3.5 - 5.1 mmol/L   Chloride 94 (L) 98 - 111 mmol/L   CO2 28 22 - 32 mmol/L   Glucose, Bld 109 (H) 70 - 99 mg/dL    Comment: Glucose reference range applies only to samples taken after fasting for at least 8 hours.   BUN 18 8 - 23 mg/dL   Creatinine, Ser 9.26 0.44 - 1.00 mg/dL   Calcium  9.2 8.9 - 10.3 mg/dL   GFR, Estimated >39 >39 mL/min    Comment: (NOTE) Calculated using the CKD-EPI Creatinine Equation (2021)    Anion gap 14 5 - 15    Comment: Performed at [facility], [address] [place], [place] [address]  Troponin I (High Sensitivity)      Status: None   Collection Time: 01/21/23  9:20 PM  Result Value Ref Range   Troponin I (High Sensitivity) 8 <18 ng/L    Comment: (NOTE) Elevated high sensitivity troponin I (hsTnI) values and significant  changes across serial measurements may suggest ACS but many other  chronic and acute conditions are known to elevate hsTnI results.  Refer to the Links section for chest pain algorithms and additional  guidance. Performed at [facility], [address] [place], [place] [address]   Troponin I (High Sensitivity)     Status: None   Collection Time: 01/22/23 12:00 AM  Result Value Ref Range   Troponin I (High Sensitivity) 9 <18 ng/L    Comment: (NOTE) Elevated high sensitivity troponin I (hsTnI) values and significant  changes across serial measurements may suggest ACS but many other  chronic and acute conditions are known to elevate hsTnI results.  Refer to the Links section for chest pain algorithms and additional  guidance. Performed at [facility], [address], [place], [place]  [address]   POCT Glucose (CBG)     Status: Abnormal   Collection Time: 01/24/23  2:37 PM  Result Value Ref Range   POC Glucose 117 (A) 70 - 99 mg/dl      Assessment & Plan:  Patient needs to start prednisone  burst.  Avoid caffeinated drinks.  Take her medications in the morning daily.  Continue inhalers. Problem List Items Addressed This Visit     COPD (chronic obstructive pulmonary disease) (HCC)   Hypertension associated with diabetes (HCC) - Primary   Nicotine  dependence   Type 2 diabetes mellitus with vascular disease (HCC)   Relevant Orders   POCT Glucose (CBG) (Completed)   Combined hyperlipidemia associated with type 2 diabetes mellitus (HCC)   Other Visit Diagnoses       Sinus tachycardia       Relevant Orders   EKG 12-Lead       Follow up as scheduled.  Total time spent: 30 minutes  FERNAND FREDY RAMAN, MD  01/24/2023   This document may have been  prepared by [facility] Voice Recognition software and as such may include unintentional dictation errors.

## 2023-02-03 ENCOUNTER — Ambulatory Visit: Payer: 59 | Admitting: Internal Medicine

## 2023-02-07 ENCOUNTER — Ambulatory Visit: Payer: 59 | Admitting: Internal Medicine

## 2023-02-07 ENCOUNTER — Other Ambulatory Visit: Payer: Self-pay | Admitting: Internal Medicine

## 2023-02-07 MED ORDER — ESOMEPRAZOLE MAGNESIUM 40 MG PO CPDR: 40.0000 mg | DELAYED_RELEASE_CAPSULE | Freq: Every day | ORAL | 1 refills | Status: DC

## 2023-02-13 ENCOUNTER — Ambulatory Visit (INDEPENDENT_AMBULATORY_CARE_PROVIDER_SITE_OTHER): Payer: 59 | Admitting: Internal Medicine

## 2023-02-13 ENCOUNTER — Encounter: Payer: Self-pay | Admitting: Internal Medicine

## 2023-02-13 VITALS — BP 118/80 | HR 88 | Ht 63.0 in | Wt 156.2 lb

## 2023-02-13 DIAGNOSIS — J449 Chronic obstructive pulmonary disease, unspecified: Secondary | ICD-10-CM | POA: Diagnosis not present

## 2023-02-13 DIAGNOSIS — I152 Hypertension secondary to endocrine disorders: Secondary | ICD-10-CM | POA: Diagnosis not present

## 2023-02-13 DIAGNOSIS — E1159 Type 2 diabetes mellitus with other circulatory complications: Secondary | ICD-10-CM | POA: Diagnosis not present

## 2023-02-13 DIAGNOSIS — Z1211 Encounter for screening for malignant neoplasm of colon: Secondary | ICD-10-CM

## 2023-02-13 DIAGNOSIS — Z13820 Encounter for screening for osteoporosis: Secondary | ICD-10-CM

## 2023-02-13 DIAGNOSIS — Z1231 Encounter for screening mammogram for malignant neoplasm of breast: Secondary | ICD-10-CM | POA: Diagnosis not present

## 2023-02-13 DIAGNOSIS — F17200 Nicotine dependence, unspecified, uncomplicated: Secondary | ICD-10-CM

## 2023-02-13 LAB — POCT CBG (FASTING - GLUCOSE)-MANUAL ENTRY: Glucose Fasting, POC: 126 mg/dL — AB (ref 70–99)

## 2023-02-13 NOTE — Progress Notes (Signed)
Established Patient Office Visit  Subjective:  Patient ID: Angela Daniel, female    DOB: [birth year]  Age: 68 y.o. MRN: [id number]  Chief Complaint  Patient presents with   Follow-up    2 week follow up    Patient comes in for her follow-up, accompanied by her friend.  Today she is feeling better after completing the prednisone burst.  Her breathing has eased up and has much less chest congestion, however she continues to smoke cigarettes.  Her pulse and blood pressure also normalized since she took her medications this morning and is staying away from [facility]. Patient admits that she is overdue for her mammogram, bone density and colon cancer screening.  Will schedule her mammogram and DEXA scan. Her last colonoscopy was more than 17 years ago, she recalls that she did have a polyp but it was benign.  She does not have a family history of colon polyps or cancer.  Will set up Cologuard for her.    No other concerns at this time.   Past Medical History:  Diagnosis Date   Congestive heart failure (HCC)    COPD (chronic obstructive pulmonary disease) (HCC)    Degenerative joint disease    Diabetes mellitus without complication (HCC)    Fibromyalgia    GERD (gastroesophageal reflux disease)    Hypertension    Scoliosis    Sleep apnea     Past Surgical History:  Procedure Laterality Date   ABDOMINAL HYSTERECTOMY     APPENDECTOMY     CESAREAN SECTION     x3   CHOLECYSTECTOMY     HIP SURGERY Left    joint replacement   KYPHOPLASTY N/A 05/18/2019   Procedure: T6 KYPHOPLASTY;  Surgeon: Kennedy Bucker, MD;  Location: [facility] ORS;  Service: Orthopedics;  Laterality: N/A;    Social History   Socioeconomic History   Marital status: Single    Spouse name: Not on file   Number of children: Not on file   Years of education: Not on file   Highest education level: Not on file  Occupational History   Not on file  Tobacco Use   Smoking status: Every Day    Current packs/day:  0.50    Types: Cigarettes   Smokeless tobacco: Never  Vaping Use   Vaping status: Never Used  Substance and Sexual Activity   Alcohol use: No   Drug use: No   Sexual activity: Not on file  Other Topics Concern   Not on file  Social History Narrative   Not on file   Social Drivers of Health   Financial Resource Strain: Not on file  Food Insecurity: Not on file  Transportation Needs: Not on file  Physical Activity: Not on file  Stress: Not on file  Social Connections: Not on file  Intimate Partner Violence: Not on file    Family History  Problem Relation Age of Onset   CVA Mother    Lung cancer Mother    Diabetes Mother    Heart attack Father    Hypertension Father    Lung cancer Father    Asthma Father    Breast cancer Neg Hx     Allergies  Allergen Reactions   Tramadol Hives   Tylenol [Acetaminophen] Nausea And Vomiting   Vicodin [Hydrocodone-Acetaminophen] Hives    Outpatient Medications Prior to Visit  Medication Sig   albuterol (VENTOLIN HFA) 108 (90 Base) MCG/ACT inhaler Inhale 2 puffs into the lungs every 4 (four) hours  as needed.   alprazolam (XANAX) 2 MG tablet Take 2 mg by mouth 3 (three) times daily.   amphetamine-dextroamphetamine (ADDERALL) 20 MG tablet Take 20 mg by mouth once a week.   amphetamine-dextroamphetamine (ADDERALL) 30 MG tablet Take 1 tablet by mouth 2 (two) times daily.   ARIPiprazole (ABILIFY) 15 MG tablet Take 15 mg by mouth once.   aspirin EC 81 MG tablet Take 81 mg by mouth daily.   blood glucose meter kit and supplies KIT Dispense based on patient and insurance preference. Use up to four times daily as directed. Check blood sugar three times a day   buprenorphine (SUBUTEX) 8 MG SUBL SL tablet Place 8 mg under the tongue 3 (three) times daily.   Cholecalciferol (VITAMIN D3) 50 MCG (2000 UT) TABS Take 2,000 mg by mouth daily.   Cyanocobalamin 2500 MCG CHEW Chew 2,500 mcg by mouth daily.   diclofenac (VOLTAREN) 50 MG EC tablet Take  50 mg by mouth 2 (two) times daily as needed for mild pain.   esomeprazole (NEXIUM) 40 MG capsule Take 1 capsule (40 mg total) by mouth daily.   fluticasone (FLONASE) 50 MCG/ACT nasal spray Place 1 spray into both nostrils at bedtime.   Fluticasone-Salmeterol (ADVAIR) 250-50 MCG/DOSE AEPB Inhale 1 puff into the lungs 2 (two) times daily.   gabapentin (NEURONTIN) 600 MG tablet Take 600 mg by mouth daily.   gabapentin (NEURONTIN) 800 MG tablet Take 800 mg by mouth 3 (three) times daily.   isosorbide mononitrate (IMDUR) 30 MG 24 hr tablet TAKE 1 TABLET BY MOUTH ONCE DAILY   levETIRAcetam (KEPPRA) 500 MG tablet TAKE 1 TABLET BY MOUTH TWICE DAILY   lisinopril (ZESTRIL) 10 MG tablet Take 10 mg by mouth daily.   magnesium oxide (MAG-OX) 400 (240 Mg) MG tablet Take 1 tablet (400 mg total) by mouth 2 (two) times daily.   metFORMIN (GLUCOPHAGE) 500 MG tablet Take 500 mg by mouth 2 (two) times daily with a meal.    metoprolol succinate (TOPROL-XL) 50 MG 24 hr tablet TAKE 1 TABLET BY MOUTH ONCE DAILY   Omega-3 Fatty Acids (FISH OIL) 500 MG CAPS Take 1 capsule by mouth 1 day or 1 dose.   ondansetron (ZOFRAN-ODT) 4 MG disintegrating tablet Take 4 mg by mouth every 6 (six) hours as needed.   rosuvastatin (CRESTOR) 20 MG tablet TAKE 1 TABLET BY MOUTH ONCE EVERY EVENING   SPIRIVA HANDIHALER 18 MCG inhalation capsule Place 18 mcg into inhaler and inhale daily.   spironolactone (ALDACTONE) 25 MG tablet TAKE 1/2 TABLET BY MOUTH EVERY DAY   tiZANidine (ZANAFLEX) 4 MG tablet Take 1 tablet (4 mg total) by mouth every 6 (six) hours as needed.   torsemide (DEMADEX) 20 MG tablet TAKE 1 TABLET BY MOUTH TWICE DAILY   Vitamin E 400 units TABS Take 400 Units by mouth daily.   predniSONE (DELTASONE) 20 MG tablet Take 2 tablets (40 mg total) by mouth daily with breakfast. (Patient not taking: Reported on 02/13/2023)   No facility-administered medications prior to visit.    Review of Systems  Constitutional: Negative.   Negative for chills, diaphoresis, fever, malaise/fatigue and weight loss.  HENT: Negative.  Negative for congestion and sore throat.   Eyes: Negative.   Respiratory: Negative.  Negative for cough, sputum production, shortness of breath and wheezing.   Cardiovascular: Negative.  Negative for chest pain, palpitations and leg swelling.  Gastrointestinal: Negative.  Negative for abdominal pain, constipation, diarrhea, heartburn, nausea and vomiting.  Genitourinary: Negative.  Negative for dysuria and flank pain.  Musculoskeletal: Negative.  Negative for joint pain and myalgias.  Skin: Negative.   Neurological: Negative.  Negative for dizziness and headaches.  Endo/Heme/Allergies: Negative.   Psychiatric/Behavioral: Negative.  Negative for depression and suicidal ideas. The patient is not nervous/anxious.        Objective:   BP 118/80   Pulse 88   Ht 5\' 3"  (1.6 m)   Wt 156 lb 3.2 oz (70.9 kg)   SpO2 95%   BMI 27.67 kg/m   Vitals:   02/13/23 1251  BP: 118/80  Pulse: 88  Height: 5\' 3"  (1.6 m)  Weight: 156 lb 3.2 oz (70.9 kg)  SpO2: 95%  BMI (Calculated): 27.68    Physical Exam Vitals and nursing note reviewed.  Constitutional:      Appearance: Normal appearance.  HENT:     Head: Normocephalic and atraumatic.     Nose: Nose normal.     Mouth/Throat:     Mouth: Mucous membranes are moist.     Pharynx: Oropharynx is clear.  Eyes:     Conjunctiva/sclera: Conjunctivae normal.     Pupils: Pupils are equal, round, and reactive to light.  Cardiovascular:     Rate and Rhythm: Normal rate and regular rhythm.     Pulses: Normal pulses.     Heart sounds: Normal heart sounds. No murmur heard. Pulmonary:     Effort: Pulmonary effort is normal.     Breath sounds: Normal breath sounds. No wheezing.  Abdominal:     General: Bowel sounds are normal.     Palpations: Abdomen is soft.     Tenderness: There is no abdominal tenderness. There is no right CVA tenderness or left CVA  tenderness.  Musculoskeletal:        General: Normal range of motion.     Cervical back: Normal range of motion.     Right lower leg: No edema.     Left lower leg: No edema.  Skin:    General: Skin is warm and dry.  Neurological:     General: No focal deficit present.     Mental Status: She is alert and oriented to person, place, and time.  Psychiatric:        Mood and Affect: Mood normal.        Behavior: Behavior normal.      Results for orders placed or performed in visit on 02/13/23  POCT CBG (Fasting - Glucose)  Result Value Ref Range   Glucose Fasting, POC 126 (A) 70 - 99 mg/dL    Recent Results (from the past 2160 hours)  CBC with Diff     Status: None   Collection Time: 01/09/23  1:34 PM  Result Value Ref Range   WBC 5.9 3.4 - 10.8 x10E3/uL   RBC 4.50 3.77 - 5.28 x10E6/uL   Hemoglobin 13.8 11.1 - 15.9 g/dL   Hematocrit 02.7 25.3 - 46.6 %   MCV 94 79 - 97 fL   MCH 30.7 26.6 - 33.0 pg   MCHC 32.5 31.5 - 35.7 g/dL   RDW 66.4 40.3 - 47.4 %   Platelets 205 150 - 450 x10E3/uL   Neutrophils 67 Not Estab. %   Lymphs 25 Not Estab. %   Monocytes 6 Not Estab. %   Eos 1 Not Estab. %   Basos 1 Not Estab. %   Neutrophils Absolute 4.0 1.4 - 7.0 x10E3/uL   Lymphocytes Absolute 1.5 0.7 - 3.1 x10E3/uL   Monocytes  Absolute 0.4 0.1 - 0.9 x10E3/uL   EOS (ABSOLUTE) 0.1 0.0 - 0.4 x10E3/uL   Basophils Absolute 0.0 0.0 - 0.2 x10E3/uL   Immature Granulocytes 0 Not Estab. %   Immature Grans (Abs) 0.0 0.0 - 0.1 x10E3/uL  CMP14+EGFR     Status: Abnormal   Collection Time: 01/09/23  1:34 PM  Result Value Ref Range   Glucose 143 (H) 70 - 99 mg/dL   BUN 11 8 - 27 mg/dL   Creatinine, Ser 9.60 0.57 - 1.00 mg/dL   eGFR 67 >45 WU/JWJ/1.91   BUN/Creatinine Ratio 12 12 - 28   Sodium 143 134 - 144 mmol/L   Potassium 4.3 3.5 - 5.2 mmol/L   Chloride 103 96 - 106 mmol/L   CO2 24 20 - 29 mmol/L   Calcium 9.6 8.7 - 10.3 mg/dL   Total Protein 7.0 6.0 - 8.5 g/dL   Albumin 4.3 3.9 - 4.9 g/dL    Globulin, Total 2.7 1.5 - 4.5 g/dL   Bilirubin Total 0.2 0.0 - 1.2 mg/dL   Alkaline Phosphatase 101 44 - 121 IU/L   AST 16 0 - 40 IU/L   ALT 10 0 - 32 IU/L  Vitamin D (25 hydroxy)     Status: None   Collection Time: 01/09/23  1:34 PM  Result Value Ref Range   Vit D, 25-Hydroxy 34.7 30.0 - 100.0 ng/mL    Comment: Vitamin D deficiency has been defined by the Institute of Medicine and an Endocrine Society practice guideline as a level of serum 25-OH vitamin D less than 20 ng/mL (1,2). The Endocrine Society went on to further define vitamin D insufficiency as a level between 21 and 29 ng/mL (2). 1. IOM (Institute of Medicine). 2010. Dietary reference    intakes for calcium and D. [place]: The    [employer]. 2. Holick MF, Binkley [place], Bischoff-Ferrari HA, et al.    Evaluation, treatment, and prevention of vitamin D    deficiency: an Endocrine Society clinical practice    guideline. JCEM. 2011 Jul; 96(7):1911-30.   Lipid Panel w/o Chol/HDL Ratio     Status: None   Collection Time: 01/09/23  1:34 PM  Result Value Ref Range   Cholesterol, Total 134 100 - 199 mg/dL   Triglycerides 84 0 - 149 mg/dL   HDL 67 >47 mg/dL   VLDL Cholesterol Cal 16 5 - 40 mg/dL   LDL Chol Calc (NIH) 51 0 - 99 mg/dL  Hemoglobin W2N     Status: Abnormal   Collection Time: 01/09/23  1:34 PM  Result Value Ref Range   Hgb A1c MFr Bld 6.3 (H) 4.8 - 5.6 %    Comment:          Prediabetes: 5.7 - 6.4          Diabetes: >6.4          Glycemic control for adults with diabetes: <7.0    Est. average glucose Bld gHb Est-mCnc 134 mg/dL  POCT CBG (Fasting - Glucose)     Status: Abnormal   Collection Time: 01/20/23  1:06 PM  Result Value Ref Range   Glucose Fasting, POC 147 (A) 70 - 99 mg/dL  CBC with Differential     Status: Abnormal   Collection Time: 01/21/23  9:20 PM  Result Value Ref Range   WBC 11.2 (H) 4.0 - 10.5 K/uL   RBC 4.55 3.87 - 5.11 MIL/uL   Hemoglobin 14.3 12.0 - 15.0 g/dL   HCT  43.3 36.0 - 46.0 %   MCV 95.2 80.0 - 100.0 fL   MCH 31.4 26.0 - 34.0 pg   MCHC 33.0 30.0 - 36.0 g/dL   RDW 21.3 08.6 - 57.8 %   Platelets 190 150 - 400 K/uL   nRBC 0.0 0.0 - 0.2 %   Neutrophils Relative % 82 %   Neutro Abs 9.1 (H) 1.7 - 7.7 K/uL   Lymphocytes Relative 11 %   Lymphs Abs 1.2 0.7 - 4.0 K/uL   Monocytes Relative 6 %   Monocytes Absolute 0.7 0.1 - 1.0 K/uL   Eosinophils Relative 1 %   Eosinophils Absolute 0.1 0.0 - 0.5 K/uL   Basophils Relative 0 %   Basophils Absolute 0.0 0.0 - 0.1 K/uL   Immature Granulocytes 0 %   Abs Immature Granulocytes 0.05 0.00 - 0.07 K/uL    Comment: Performed at [facility], [address] [place], [place] [address]  Basic metabolic panel     Status: Abnormal   Collection Time: 01/21/23  9:20 PM  Result Value Ref Range   Sodium 136 135 - 145 mmol/L   Potassium 4.2 3.5 - 5.1 mmol/L   Chloride 94 (L) 98 - 111 mmol/L   CO2 28 22 - 32 mmol/L   Glucose, Bld 109 (H) 70 - 99 mg/dL    Comment: Glucose reference range applies only to samples taken after fasting for at least 8 hours.   BUN 18 8 - 23 mg/dL   Creatinine, Ser 9.52 0.44 - 1.00 mg/dL   Calcium 9.2 8.9 - 84.1 mg/dL   GFR, Estimated >32 >44 mL/min    Comment: (NOTE) Calculated using the CKD-EPI Creatinine Equation (2021)    Anion gap 14 5 - 15    Comment: Performed at [facility], [address], [place], [place] [address]  Troponin I (High Sensitivity)     Status: None   Collection Time: 01/21/23  9:20 PM  Result Value Ref Range   Troponin I (High Sensitivity) 8 <18 ng/L    Comment: (NOTE) Elevated high sensitivity troponin I (hsTnI) values and significant  changes across serial measurements may suggest ACS but many other  chronic and acute conditions are known to elevate hsTnI results.  Refer to the "Links" section for chest pain algorithms and additional  guidance. Performed at [facility], [address] [place], [place] [address]    Troponin I (High Sensitivity)     Status: None   Collection Time: 01/22/23 12:00 AM  Result Value Ref Range   Troponin I (High Sensitivity) 9 <18 ng/L    Comment: (NOTE) Elevated high sensitivity troponin I (hsTnI) values and significant  changes across serial measurements may suggest ACS but many other  chronic and acute conditions are known to elevate hsTnI results.  Refer to the "Links" section for chest pain algorithms and additional  guidance. Performed at [facility], [address] [place], [place] [address]   POCT Glucose (CBG)     Status: Abnormal   Collection Time: 01/24/23  2:37 PM  Result Value Ref Range   POC Glucose 117 (A) 70 - 99 mg/dl  POCT CBG (Fasting - Glucose)     Status: Abnormal   Collection Time: 02/13/23 12:55 PM  Result Value Ref Range   Glucose Fasting, POC 126 (A) 70 - 99 mg/dL      Assessment & Plan:  Patient advised to continue her medications.  Emphasized on smoking cessation.  Schedule mammo DEXA  and Cologuard. Problem List Items Addressed This Visit     COPD (chronic obstructive pulmonary disease) (HCC)   Hypertension associated with diabetes (HCC) - Primary   Nicotine dependence   Type 2 diabetes mellitus with vascular disease (HCC)   Relevant Orders   POCT CBG (Fasting - Glucose) (Completed)   Other Visit Diagnoses       Screening for osteoporosis       Relevant Orders   DG Bone Density     Breast cancer screening by mammogram       Relevant Orders   MM 3D SCREENING MAMMOGRAM BILATERAL BREAST     Colon cancer screening       Relevant Orders   Cologuard       Return in about 3 months (around 05/14/2023).   Total time spent: 30 minutes  Margaretann Loveless, MD  02/13/2023   This document may have been prepared by [facility] Voice Recognition software and as such may include unintentional dictation errors.

## 2023-02-18 ENCOUNTER — Other Ambulatory Visit: Payer: Self-pay | Admitting: Internal Medicine

## 2023-02-18 ENCOUNTER — Telehealth: Payer: Self-pay | Admitting: Internal Medicine

## 2023-02-18 DIAGNOSIS — J441 Chronic obstructive pulmonary disease with (acute) exacerbation: Secondary | ICD-10-CM

## 2023-02-18 DIAGNOSIS — J410 Simple chronic bronchitis: Secondary | ICD-10-CM

## 2023-02-18 MED ORDER — LEVOFLOXACIN 500 MG PO TABS: 500.0000 mg | ORAL_TABLET | Freq: Every day | ORAL | 0 refills | Status: AC

## 2023-02-18 MED ORDER — PREDNISONE 20 MG PO TABS: 40.0000 mg | ORAL_TABLET | Freq: Every day | ORAL | 0 refills | Status: DC

## 2023-02-18 NOTE — Telephone Encounter (Signed)
Patient left VM requesting another round of antibiotics and prednisone because she is sick again and has a cough.   Spoke with patient and she has a cough, congestion, runny nose. Denies fever or chills. Has been around someone with a URI. These symptoms started a couple of days ago but worsened yesterday.  [facility]

## 2023-02-19 DIAGNOSIS — J449 Chronic obstructive pulmonary disease, unspecified: Secondary | ICD-10-CM | POA: Diagnosis not present

## 2023-02-21 ENCOUNTER — Other Ambulatory Visit: Payer: Self-pay | Admitting: Internal Medicine

## 2023-02-26 ENCOUNTER — Other Ambulatory Visit: Payer: 59

## 2023-02-28 ENCOUNTER — Other Ambulatory Visit: Payer: Self-pay | Admitting: Internal Medicine

## 2023-02-28 DIAGNOSIS — R300 Dysuria: Secondary | ICD-10-CM | POA: Diagnosis not present

## 2023-03-03 MED ORDER — ALBUTEROL SULFATE HFA 108 (90 BASE) MCG/ACT IN AERS: 2.0000 | INHALATION_SPRAY | RESPIRATORY_TRACT | 3 refills | Status: DC | PRN

## 2023-03-20 ENCOUNTER — Other Ambulatory Visit: Payer: Self-pay | Admitting: Internal Medicine

## 2023-03-21 DIAGNOSIS — J449 Chronic obstructive pulmonary disease, unspecified: Secondary | ICD-10-CM | POA: Diagnosis not present

## 2023-03-26 ENCOUNTER — Other Ambulatory Visit: Payer: 59

## 2023-04-17 ENCOUNTER — Ambulatory Visit: Admitting: Cardiovascular Disease

## 2023-04-18 ENCOUNTER — Other Ambulatory Visit: Payer: Self-pay | Admitting: Internal Medicine

## 2023-04-19 DIAGNOSIS — J449 Chronic obstructive pulmonary disease, unspecified: Secondary | ICD-10-CM | POA: Diagnosis not present

## 2023-05-12 ENCOUNTER — Telehealth: Payer: Self-pay | Admitting: Internal Medicine

## 2023-05-12 NOTE — Telephone Encounter (Signed)
 Patient left VM that she has a question about her medication and would like a call back.   Called patient and no answer, VM full, unable to leave VM.

## 2023-05-15 ENCOUNTER — Ambulatory Visit: Payer: 59 | Admitting: Internal Medicine

## 2023-05-15 ENCOUNTER — Encounter: Payer: Self-pay | Admitting: Internal Medicine

## 2023-05-15 VITALS — BP 164/90 | HR 69 | Ht 63.0 in | Wt 167.6 lb

## 2023-05-15 DIAGNOSIS — E782 Mixed hyperlipidemia: Secondary | ICD-10-CM | POA: Diagnosis not present

## 2023-05-15 DIAGNOSIS — E1169 Type 2 diabetes mellitus with other specified complication: Secondary | ICD-10-CM

## 2023-05-15 DIAGNOSIS — I5040 Unspecified combined systolic (congestive) and diastolic (congestive) heart failure: Secondary | ICD-10-CM | POA: Diagnosis not present

## 2023-05-15 DIAGNOSIS — I152 Hypertension secondary to endocrine disorders: Secondary | ICD-10-CM

## 2023-05-15 DIAGNOSIS — E1159 Type 2 diabetes mellitus with other circulatory complications: Secondary | ICD-10-CM | POA: Diagnosis not present

## 2023-05-15 DIAGNOSIS — E538 Deficiency of other specified B group vitamins: Secondary | ICD-10-CM | POA: Diagnosis not present

## 2023-05-15 LAB — POCT CBG (FASTING - GLUCOSE)-MANUAL ENTRY: Glucose Fasting, POC: 152 mg/dL — AB (ref 70–99)

## 2023-05-15 NOTE — Progress Notes (Signed)
 Established Patient Office Visit  Subjective:  Patient ID: Angela Daniel, female    DOB: [birth year]  Age: 68 y.o. MRN: [id number]  Chief Complaint  Patient presents with   Follow-up    3 month follow up    Patient comes in for her follow up. Her BP is very high today, stressed and grieving for recent deaths in family. Also needs a referral to a new Psychiatrist. Did not get mammo, DEXA or Cologuard.  Will get labs today.    No other concerns at this time.   Past Medical History:  Diagnosis Date   Congestive heart failure (HCC)    COPD (chronic obstructive pulmonary disease) (HCC)    Degenerative joint disease    Diabetes mellitus without complication (HCC)    Fibromyalgia    GERD (gastroesophageal reflux disease)    Hypertension    Scoliosis    Sleep apnea     Past Surgical History:  Procedure Laterality Date   ABDOMINAL HYSTERECTOMY     APPENDECTOMY     CESAREAN SECTION     x3   CHOLECYSTECTOMY     HIP SURGERY Left    joint replacement   KYPHOPLASTY N/A 05/18/2019   Procedure: T6 KYPHOPLASTY;  Surgeon: Molli Angelucci, MD;  Location: [facility] ORS;  Service: Orthopedics;  Laterality: N/A;    Social History   Socioeconomic History   Marital status: Single    Spouse name: Not on file   Number of children: Not on file   Years of education: Not on file   Highest education level: Not on file  Occupational History   Not on file  Tobacco Use   Smoking status: Every Day    Current packs/day: 0.50    Types: Cigarettes   Smokeless tobacco: Never  Vaping Use   Vaping status: Never Used  Substance and Sexual Activity   Alcohol use: No   Drug use: No   Sexual activity: Not on file  Other Topics Concern   Not on file  Social History Narrative   Not on file   Social Drivers of Health   Financial Resource Strain: Not on file  Food Insecurity: Not on file  Transportation Needs: Not on file  Physical Activity: Not on file  Stress: Not on file  Social  Connections: Not on file  Intimate Partner Violence: Not on file    Family History  Problem Relation Age of Onset   CVA Mother    Lung cancer Mother    Diabetes Mother    Heart attack Father    Hypertension Father    Lung cancer Father    Asthma Father    Breast cancer Neg Hx     Allergies  Allergen Reactions   Tramadol Hives   Tylenol  [Acetaminophen ] Nausea And Vomiting   Vicodin [Hydrocodone -Acetaminophen ] Hives    Outpatient Medications Prior to Visit  Medication Sig   albuterol  (VENTOLIN  HFA) 108 (90 Base) MCG/ACT inhaler Inhale 2 puffs into the lungs every 4 (four) hours as needed.   alprazolam  (XANAX ) 2 MG tablet Take 2 mg by mouth 3 (three) times daily.   amphetamine -dextroamphetamine  (ADDERALL) 20 MG tablet Take 20 mg by mouth once a week.   amphetamine -dextroamphetamine  (ADDERALL) 30 MG tablet Take 1 tablet by mouth 2 (two) times daily.   ARIPiprazole  (ABILIFY ) 15 MG tablet Take 15 mg by mouth once.   aspirin  EC 81 MG tablet Take 81 mg by mouth daily.   buprenorphine  (SUBUTEX ) 8 MG SUBL SL tablet  Place 8 mg under the tongue 3 (three) times daily.   Cholecalciferol  (VITAMIN D3) 50 MCG (2000 UT) TABS Take 2,000 mg by mouth daily.   Cyanocobalamin  2500 MCG CHEW Chew 2,500 mcg by mouth daily.   esomeprazole  (NEXIUM ) 40 MG capsule TAKE 1 CAPSULE BY MOUTH ONCE DAILY   fluticasone  (FLONASE ) 50 MCG/ACT nasal spray Place 1 spray into both nostrils at bedtime.   Fluticasone -Salmeterol (ADVAIR) 250-50 MCG/DOSE AEPB Inhale 1 puff into the lungs 2 (two) times daily.   gabapentin  (NEURONTIN ) 600 MG tablet Take 600 mg by mouth daily.   gabapentin  (NEURONTIN ) 800 MG tablet Take 800 mg by mouth 3 (three) times daily.   isosorbide  mononitrate (IMDUR ) 30 MG 24 hr tablet TAKE 1 TABLET BY MOUTH ONCE DAILY   levETIRAcetam  (KEPPRA ) 500 MG tablet TAKE 1 TABLET BY MOUTH TWICE DAILY   lisinopril  (ZESTRIL ) 10 MG tablet Take 10 mg by mouth daily.   metFORMIN (GLUCOPHAGE) 500 MG tablet Take 500  mg by mouth 2 (two) times daily with a meal.    metoprolol  succinate (TOPROL -XL) 50 MG 24 hr tablet TAKE 1 TABLET BY MOUTH ONCE DAILY   Omega-3 Fatty Acids (FISH OIL) 500 MG CAPS Take 1 capsule by mouth 1 day or 1 dose.   ondansetron  (ZOFRAN -ODT) 4 MG disintegrating tablet Take 4 mg by mouth every 6 (six) hours as needed.   rosuvastatin  (CRESTOR ) 20 MG tablet TAKE 1 TABLET BY MOUTH ONCE EVERY EVENING   SPIRIVA  HANDIHALER 18 MCG inhalation capsule Place 18 mcg into inhaler and inhale daily.   spironolactone  (ALDACTONE ) 25 MG tablet TAKE 1/2 TABLET BY MOUTH EVERY DAY   tiZANidine  (ZANAFLEX ) 4 MG tablet TAKE 1 TABLET BY MOUTH EVERY 6 HOURS AS NEEDED   torsemide  (DEMADEX ) 20 MG tablet TAKE 1 TABLET BY MOUTH TWICE DAILY   Vitamin E  400 units TABS Take 400 Units by mouth daily.   blood glucose meter kit and supplies KIT Dispense based on patient and insurance preference. Use up to four times daily as directed. Check blood sugar three times a day (Patient not taking: Reported on 05/15/2023)   diclofenac  (VOLTAREN ) 50 MG EC tablet Take 50 mg by mouth 2 (two) times daily as needed for mild pain. (Patient not taking: Reported on 05/15/2023)   magnesium  oxide (MAG-OX) 400 (240 Mg) MG tablet Take 1 tablet (400 mg total) by mouth 2 (two) times daily. (Patient not taking: Reported on 05/15/2023)   predniSONE  (DELTASONE ) 20 MG tablet Take 2 tablets (40 mg total) by mouth daily with breakfast. (Patient not taking: Reported on 05/15/2023)   No facility-administered medications prior to visit.    Review of Systems  Constitutional:  Positive for malaise/fatigue. Negative for chills, fever and weight loss.  HENT: Negative.  Negative for sore throat.   Eyes: Negative.   Respiratory: Negative.  Negative for cough and shortness of breath.   Cardiovascular: Negative.  Negative for chest pain, palpitations and leg swelling.  Gastrointestinal: Negative.  Negative for abdominal pain, constipation, diarrhea, heartburn,  nausea and vomiting.  Genitourinary: Negative.  Negative for dysuria and flank pain.  Musculoskeletal: Negative.  Negative for joint pain and myalgias.  Skin: Negative.   Neurological: Negative.  Negative for dizziness, tingling, tremors, sensory change, focal weakness and headaches.  Endo/Heme/Allergies: Negative.   Psychiatric/Behavioral:  Positive for depression. Negative for suicidal ideas. The patient is nervous/anxious.        Objective:   BP (!) 164/90   Pulse 69   Ht 5\' 3"  (1.6  m)   Wt 167 lb 9.6 oz (76 kg)   SpO2 96%   BMI 29.69 kg/m   Vitals:   05/15/23 1017  BP: (!) 164/90  Pulse: 69  Height: 5\' 3"  (1.6 m)  Weight: 167 lb 9.6 oz (76 kg)  SpO2: 96%  BMI (Calculated): 29.7    Physical Exam Vitals and nursing note reviewed.  Constitutional:      Appearance: Normal appearance.  HENT:     Head: Normocephalic and atraumatic.     Nose: Nose normal.     Mouth/Throat:     Mouth: Mucous membranes are moist.     Pharynx: Oropharynx is clear.  Eyes:     Conjunctiva/sclera: Conjunctivae normal.     Pupils: Pupils are equal, round, and reactive to light.  Cardiovascular:     Rate and Rhythm: Normal rate and regular rhythm.     Pulses: Normal pulses.     Heart sounds: Normal heart sounds. No murmur heard. Pulmonary:     Effort: Pulmonary effort is normal.     Breath sounds: Normal breath sounds. No wheezing.  Abdominal:     General: Bowel sounds are normal.     Palpations: Abdomen is soft.     Tenderness: There is no abdominal tenderness. There is no right CVA tenderness or left CVA tenderness.  Musculoskeletal:        General: Normal range of motion.     Cervical back: Normal range of motion.     Right lower leg: No edema.     Left lower leg: No edema.  Skin:    General: Skin is warm and dry.  Neurological:     General: No focal deficit present.     Mental Status: She is alert and oriented to person, place, and time.  Psychiatric:        Mood and Affect:  Mood normal.        Behavior: Behavior normal.      Results for orders placed or performed in visit on 05/15/23  POCT CBG (Fasting - Glucose)  Result Value Ref Range   Glucose Fasting, POC 152 (A) 70 - 99 mg/dL    Recent Results (from the past 2160 hours)  POCT CBG (Fasting - Glucose)     Status: Abnormal   Collection Time: 05/15/23 10:23 AM  Result Value Ref Range   Glucose Fasting, POC 152 (A) 70 - 99 mg/dL      Assessment & Plan:  Patient will try [facility] .  Check Labs today. Need to monitor BP and adjust meds if needed, return in 10 days or so. Problem List Items Addressed This Visit     Hypertension associated with diabetes (HCC) - Primary   Relevant Orders   CMP14+EGFR   Congestive heart failure (HCC)   Relevant Orders   CBC with Diff   Type 2 diabetes mellitus with vascular disease (HCC)   Relevant Orders   POCT CBG (Fasting - Glucose) (Completed)   Hemoglobin A1c   Combined hyperlipidemia associated with type 2 diabetes mellitus (HCC)   Relevant Orders   Lipid Panel w/o Chol/HDL Ratio   Other Visit Diagnoses       Vitamin B12 deficiency       Relevant Orders   Vitamin B12       Follow up 10 days.   Total time spent: 30 minutes  Aisha Hove, MD  05/15/2023   This document may have been prepared by [facility] Voice Recognition software and as such  may include unintentional dictation errors.

## 2023-05-16 ENCOUNTER — Telehealth: Payer: Self-pay | Admitting: Internal Medicine

## 2023-05-16 ENCOUNTER — Other Ambulatory Visit: Payer: Self-pay | Admitting: Internal Medicine

## 2023-05-16 LAB — CBC WITH DIFFERENTIAL/PLATELET
Basophils Absolute: 0 10*3/uL (ref 0.0–0.2)
Basos: 1 %
EOS (ABSOLUTE): 0.2 10*3/uL (ref 0.0–0.4)
Eos: 3 %
Hematocrit: 39.6 % (ref 34.0–46.6)
Hemoglobin: 12.9 g/dL (ref 11.1–15.9)
Immature Grans (Abs): 0 10*3/uL (ref 0.0–0.1)
Immature Granulocytes: 0 %
Lymphocytes Absolute: 1.6 10*3/uL (ref 0.7–3.1)
Lymphs: 32 %
MCH: 31.6 pg (ref 26.6–33.0)
MCHC: 32.6 g/dL (ref 31.5–35.7)
MCV: 97 fL (ref 79–97)
Monocytes Absolute: 0.3 10*3/uL (ref 0.1–0.9)
Monocytes: 6 %
Neutrophils Absolute: 3 10*3/uL (ref 1.4–7.0)
Neutrophils: 58 %
Platelets: 193 10*3/uL (ref 150–450)
RBC: 4.08 x10E6/uL (ref 3.77–5.28)
RDW: 13.6 % (ref 11.7–15.4)
WBC: 5.1 10*3/uL (ref 3.4–10.8)

## 2023-05-16 LAB — CMP14+EGFR
ALT: 32 IU/L (ref 0–32)
AST: 48 IU/L — ABNORMAL HIGH (ref 0–40)
Albumin: 4.7 g/dL (ref 3.9–4.9)
Alkaline Phosphatase: 116 IU/L (ref 44–121)
BUN/Creatinine Ratio: 24 (ref 12–28)
BUN: 21 mg/dL (ref 8–27)
Bilirubin Total: <0.2 mg/dL (ref 0.0–1.2)
CO2: 18 mmol/L — ABNORMAL LOW (ref 20–29)
Calcium: 9.5 mg/dL (ref 8.7–10.3)
Chloride: 104 mmol/L (ref 96–106)
Creatinine, Ser: 0.86 mg/dL (ref 0.57–1.00)
Globulin, Total: 2.7 g/dL (ref 1.5–4.5)
Glucose: 106 mg/dL — ABNORMAL HIGH (ref 70–99)
Potassium: 4.2 mmol/L (ref 3.5–5.2)
Sodium: 145 mmol/L — ABNORMAL HIGH (ref 134–144)
Total Protein: 7.4 g/dL (ref 6.0–8.5)
eGFR: 73 mL/min/{1.73_m2} (ref >59)

## 2023-05-16 LAB — HEMOGLOBIN A1C
Est. average glucose Bld gHb Est-mCnc: 128 mg/dL
Hgb A1c MFr Bld: 6.1 % — ABNORMAL HIGH (ref 4.8–5.6)

## 2023-05-16 LAB — LIPID PANEL W/O CHOL/HDL RATIO
Cholesterol, Total: 124 mg/dL (ref 100–199)
HDL: 63 mg/dL (ref >39)
LDL Chol Calc (NIH): 44 mg/dL (ref 0–99)
Triglycerides: 88 mg/dL (ref 0–149)
VLDL Cholesterol Cal: 17 mg/dL (ref 5–40)

## 2023-05-16 LAB — VITAMIN B12

## 2023-05-16 NOTE — Telephone Encounter (Signed)
 Entered in error

## 2023-05-19 ENCOUNTER — Telehealth: Payer: Self-pay | Admitting: Internal Medicine

## 2023-05-19 ENCOUNTER — Other Ambulatory Visit: Payer: Self-pay | Admitting: Internal Medicine

## 2023-05-19 DIAGNOSIS — F319 Bipolar disorder, unspecified: Secondary | ICD-10-CM

## 2023-05-19 NOTE — Telephone Encounter (Signed)
 Patient left VM that she is still waiting for us  to send a referral and her records to [facility]. Are we going to send a referral to them? She is stating that she needs the referral ASAP so she can get her meds. Please advise.

## 2023-05-19 NOTE — Telephone Encounter (Signed)
 Pt needs scripts sent & referral sent to [facility], she is out of her medications.Angela Daniel

## 2023-05-20 ENCOUNTER — Other Ambulatory Visit: Payer: Self-pay | Admitting: Cardiology

## 2023-05-20 DIAGNOSIS — J449 Chronic obstructive pulmonary disease, unspecified: Secondary | ICD-10-CM | POA: Diagnosis not present

## 2023-05-26 ENCOUNTER — Ambulatory Visit (INDEPENDENT_AMBULATORY_CARE_PROVIDER_SITE_OTHER): Admitting: Internal Medicine

## 2023-05-26 ENCOUNTER — Encounter: Payer: Self-pay | Admitting: Internal Medicine

## 2023-05-26 VITALS — BP 160/94 | HR 77 | Ht 63.0 in | Wt 168.8 lb

## 2023-05-26 DIAGNOSIS — F17200 Nicotine dependence, unspecified, uncomplicated: Secondary | ICD-10-CM

## 2023-05-26 DIAGNOSIS — J441 Chronic obstructive pulmonary disease with (acute) exacerbation: Secondary | ICD-10-CM | POA: Diagnosis not present

## 2023-05-26 DIAGNOSIS — I152 Hypertension secondary to endocrine disorders: Secondary | ICD-10-CM

## 2023-05-26 DIAGNOSIS — E1159 Type 2 diabetes mellitus with other circulatory complications: Secondary | ICD-10-CM

## 2023-05-26 DIAGNOSIS — E1169 Type 2 diabetes mellitus with other specified complication: Secondary | ICD-10-CM | POA: Diagnosis not present

## 2023-05-26 DIAGNOSIS — I5040 Unspecified combined systolic (congestive) and diastolic (congestive) heart failure: Secondary | ICD-10-CM

## 2023-05-26 DIAGNOSIS — E782 Mixed hyperlipidemia: Secondary | ICD-10-CM | POA: Diagnosis not present

## 2023-05-26 MED ORDER — METFORMIN HCL 500 MG PO TABS: 500.0000 mg | ORAL_TABLET | Freq: Two times a day (BID) | ORAL | 3 refills | Status: DC

## 2023-05-26 MED ORDER — LISINOPRIL 20 MG PO TABS: 20.0000 mg | ORAL_TABLET | Freq: Every day | ORAL | 11 refills | Status: DC

## 2023-05-26 NOTE — Progress Notes (Signed)
 Established Patient Office Visit  Subjective:  Patient ID: Angela Daniel, female    DOB: 1955/05/09  Age: 68 y.o. MRN: [id number]  Chief Complaint  Patient presents with   Follow-up    10 day follow up    Patient comes in for her follow-up accompanied by her friend.  Her blood pressure is still very high.  Labs were done last visit and discussed today.  Essentially stable.  Patient is going to see a psychiatrist soon.  Mentions anxiety.  Reports of taking all her medications regularly.  However will increase the dose of lisinopril  from 10 to 20 mg/day.  And then to continue monitoring her blood pressure.  No chest pain, no shortness of breath.  Does complain of chronic smoker's cough.    No other concerns at this time.   Past Medical History:  Diagnosis Date   Congestive heart failure (HCC)    COPD (chronic obstructive pulmonary disease) (HCC)    Degenerative joint disease    Diabetes mellitus without complication (HCC)    Fibromyalgia    GERD (gastroesophageal reflux disease)    Hypertension    Scoliosis    Sleep apnea     Past Surgical History:  Procedure Laterality Date   ABDOMINAL HYSTERECTOMY     APPENDECTOMY     CESAREAN SECTION     x3   CHOLECYSTECTOMY     HIP SURGERY Left    joint replacement   KYPHOPLASTY N/A 05/18/2019   Procedure: T6 KYPHOPLASTY;  Surgeon: Molli Angelucci, MD;  Location: [facility] ORS;  Service: Orthopedics;  Laterality: N/A;    Social History   Socioeconomic History   Marital status: Single    Spouse name: Not on file   Number of children: Not on file   Years of education: Not on file   Highest education level: Not on file  Occupational History   Not on file  Tobacco Use   Smoking status: Every Day    Current packs/day: 0.50    Types: Cigarettes   Smokeless tobacco: Never  Vaping Use   Vaping status: Never Used  Substance and Sexual Activity   Alcohol use: No   Drug use: No   Sexual activity: Not on file  Other Topics Concern    Not on file  Social History Narrative   Not on file   Social Drivers of Health   Financial Resource Strain: Not on file  Food Insecurity: Not on file  Transportation Needs: Not on file  Physical Activity: Not on file  Stress: Not on file  Social Connections: Not on file  Intimate Partner Violence: Not on file    Family History  Problem Relation Age of Onset   CVA Mother    Lung cancer Mother    Diabetes Mother    Heart attack Father    Hypertension Father    Lung cancer Father    Asthma Father    Breast cancer Neg Hx     Allergies  Allergen Reactions   Tramadol Hives   Tylenol  [Acetaminophen ] Nausea And Vomiting   Vicodin [Hydrocodone -Acetaminophen ] Hives    Outpatient Medications Prior to Visit  Medication Sig   albuterol  (VENTOLIN  HFA) 108 (90 Base) MCG/ACT inhaler Inhale 2 puffs into the lungs every 4 (four) hours as needed.   alprazolam  (XANAX ) 2 MG tablet Take 2 mg by mouth 3 (three) times daily.   ARIPiprazole  (ABILIFY ) 15 MG tablet Take 15 mg by mouth once.   aspirin  EC 81 MG tablet  Take 81 mg by mouth daily.   blood glucose meter kit and supplies KIT Dispense based on patient and insurance preference. Use up to four times daily as directed. Check blood sugar three times a day   buprenorphine  (SUBUTEX ) 8 MG SUBL SL tablet Place 8 mg under the tongue 3 (three) times daily.   Cholecalciferol  (VITAMIN D3) 50 MCG (2000 UT) TABS Take 2,000 mg by mouth daily.   Cyanocobalamin  2500 MCG CHEW Chew 2,500 mcg by mouth daily.   diclofenac  (VOLTAREN ) 50 MG EC tablet Take 50 mg by mouth 2 (two) times daily as needed for mild pain (pain score 1-3).   esomeprazole  (NEXIUM ) 40 MG capsule TAKE 1 CAPSULE BY MOUTH ONCE DAILY   fluticasone  (FLONASE ) 50 MCG/ACT nasal spray Place 1 spray into both nostrils at bedtime.   Fluticasone -Salmeterol (ADVAIR) 250-50 MCG/DOSE AEPB Inhale 1 puff into the lungs 2 (two) times daily.   gabapentin  (NEURONTIN ) 600 MG tablet Take 600 mg by mouth  daily.   gabapentin  (NEURONTIN ) 800 MG tablet Take 800 mg by mouth 3 (three) times daily.   isosorbide  mononitrate (IMDUR ) 30 MG 24 hr tablet TAKE 1 TABLET BY MOUTH ONCE DAILY   levETIRAcetam  (KEPPRA ) 500 MG tablet TAKE 1 TABLET BY MOUTH TWICE DAILY   magnesium  oxide (MAG-OX) 400 (240 Mg) MG tablet Take 1 tablet (400 mg total) by mouth 2 (two) times daily.   metoprolol  succinate (TOPROL -XL) 50 MG 24 hr tablet TAKE 1 TABLET BY MOUTH ONCE DAILY   Omega-3 Fatty Acids (FISH OIL) 500 MG CAPS Take 1 capsule by mouth 1 day or 1 dose.   ondansetron  (ZOFRAN -ODT) 4 MG disintegrating tablet Take 4 mg by mouth every 6 (six) hours as needed.   predniSONE  (DELTASONE ) 20 MG tablet Take 2 tablets (40 mg total) by mouth daily with breakfast.   rosuvastatin  (CRESTOR ) 20 MG tablet TAKE 1 TABLET BY MOUTH ONCE EVERY EVENING   SPIRIVA  HANDIHALER 18 MCG inhalation capsule Place 18 mcg into inhaler and inhale daily.   spironolactone  (ALDACTONE ) 25 MG tablet TAKE 1/2 TABLET BY MOUTH EVERY DAY   tiZANidine  (ZANAFLEX ) 4 MG tablet TAKE 1 TABLET BY MOUTH EVERY 6 HOURS AS NEEDED   torsemide  (DEMADEX ) 20 MG tablet TAKE 1 TABLET BY MOUTH TWICE DAILY   Vitamin E  400 units TABS Take 400 Units by mouth daily.   [DISCONTINUED] lisinopril  (ZESTRIL ) 10 MG tablet Take 10 mg by mouth daily.   [DISCONTINUED] metFORMIN (GLUCOPHAGE) 500 MG tablet Take 500 mg by mouth 2 (two) times daily with a meal.    [DISCONTINUED] amphetamine -dextroamphetamine  (ADDERALL) 20 MG tablet Take 20 mg by mouth once a week. (Patient not taking: Reported on 05/26/2023)   [DISCONTINUED] amphetamine -dextroamphetamine  (ADDERALL) 30 MG tablet Take 1 tablet by mouth 2 (two) times daily. (Patient not taking: Reported on 05/26/2023)   No facility-administered medications prior to visit.    Review of Systems  Constitutional: Negative.  Negative for chills and fever.  HENT: Negative.  Negative for sore throat.   Eyes: Negative.   Respiratory: Negative.  Negative for  cough and shortness of breath.   Cardiovascular: Negative.  Negative for chest pain, palpitations and leg swelling.  Gastrointestinal: Negative.  Negative for abdominal pain, constipation, diarrhea, heartburn, nausea and vomiting.  Genitourinary: Negative.  Negative for dysuria and flank pain.  Musculoskeletal: Negative.  Negative for joint pain and myalgias.  Skin: Negative.   Neurological: Negative.  Negative for dizziness and headaches.  Endo/Heme/Allergies: Negative.   Psychiatric/Behavioral: Negative.  Negative for  depression and suicidal ideas. The patient is not nervous/anxious.        Objective:   BP (!) 160/94   Pulse 77   Ht 5\' 3"  (1.6 m)   Wt 168 lb 12.8 oz (76.6 kg)   SpO2 97%   BMI 29.90 kg/m   Vitals:   05/26/23 1046  BP: (!) 160/94  Pulse: 77  Height: 5\' 3"  (1.6 m)  Weight: 168 lb 12.8 oz (76.6 kg)  SpO2: 97%  BMI (Calculated): 29.91    Physical Exam Vitals and nursing note reviewed.  Constitutional:      Appearance: Normal appearance.  HENT:     Head: Normocephalic and atraumatic.     Nose: Nose normal.     Mouth/Throat:     Mouth: Mucous membranes are moist.     Pharynx: Oropharynx is clear.  Eyes:     Conjunctiva/sclera: Conjunctivae normal.     Pupils: Pupils are equal, round, and reactive to light.  Cardiovascular:     Rate and Rhythm: Normal rate and regular rhythm.     Pulses: Normal pulses.     Heart sounds: Normal heart sounds. No murmur heard. Pulmonary:     Effort: Pulmonary effort is normal.     Breath sounds: Normal breath sounds. No wheezing.  Abdominal:     General: Bowel sounds are normal.     Palpations: Abdomen is soft.     Tenderness: There is no abdominal tenderness. There is no right CVA tenderness or left CVA tenderness.  Musculoskeletal:        General: Normal range of motion.     Cervical back: Normal range of motion.     Right lower leg: No edema.     Left lower leg: No edema.  Skin:    General: Skin is warm and  dry.  Neurological:     General: No focal deficit present.     Mental Status: She is alert and oriented to person, place, and time.  Psychiatric:        Mood and Affect: Mood normal.        Behavior: Behavior normal.      No results found for any visits on 05/26/23.  Recent Results (from the past 2160 hours)  POCT CBG (Fasting - Glucose)     Status: Abnormal   Collection Time: 05/15/23 10:23 AM  Result Value Ref Range   Glucose Fasting, POC 152 (A) 70 - 99 mg/dL  CBC with Diff     Status: None   Collection Time: 05/15/23 10:50 AM  Result Value Ref Range   WBC 5.1 3.4 - 10.8 x10E3/uL   RBC 4.08 3.77 - 5.28 x10E6/uL   Hemoglobin 12.9 11.1 - 15.9 g/dL   Hematocrit 16.1 09.6 - 46.6 %   MCV 97 79 - 97 fL   MCH 31.6 26.6 - 33.0 pg   MCHC 32.6 31.5 - 35.7 g/dL   RDW 04.5 40.9 - 81.1 %   Platelets 193 150 - 450 x10E3/uL   Neutrophils 58 Not Estab. %   Lymphs 32 Not Estab. %   Monocytes 6 Not Estab. %   Eos 3 Not Estab. %   Basos 1 Not Estab. %   Neutrophils Absolute 3.0 1.4 - 7.0 x10E3/uL   Lymphocytes Absolute 1.6 0.7 - 3.1 x10E3/uL   Monocytes Absolute 0.3 0.1 - 0.9 x10E3/uL   EOS (ABSOLUTE) 0.2 0.0 - 0.4 x10E3/uL   Basophils Absolute 0.0 0.0 - 0.2 x10E3/uL   Immature Granulocytes 0  Not Estab. %   Immature Grans (Abs) 0.0 0.0 - 0.1 x10E3/uL  CMP14+EGFR     Status: Abnormal   Collection Time: 05/15/23 10:50 AM  Result Value Ref Range   Glucose 106 (H) 70 - 99 mg/dL   BUN 21 8 - 27 mg/dL   Creatinine, Ser 4.09 0.57 - 1.00 mg/dL   eGFR 73 >81 XB/JYN/8.29   BUN/Creatinine Ratio 24 12 - 28   Sodium 145 (H) 134 - 144 mmol/L   Potassium 4.2 3.5 - 5.2 mmol/L   Chloride 104 96 - 106 mmol/L   CO2 18 (L) 20 - 29 mmol/L   Calcium  9.5 8.7 - 10.3 mg/dL   Total Protein 7.4 6.0 - 8.5 g/dL   Albumin 4.7 3.9 - 4.9 g/dL   Globulin, Total 2.7 1.5 - 4.5 g/dL   Bilirubin Total <5.6 0.0 - 1.2 mg/dL   Alkaline Phosphatase 116 44 - 121 IU/L   AST 48 (H) 0 - 40 IU/L   ALT 32 0 - 32 IU/L   Lipid Panel w/o Chol/HDL Ratio     Status: None   Collection Time: 05/15/23 10:50 AM  Result Value Ref Range   Cholesterol, Total 124 100 - 199 mg/dL   Triglycerides 88 0 - 149 mg/dL   HDL 63 >21 mg/dL   VLDL Cholesterol Cal 17 5 - 40 mg/dL   LDL Chol Calc (NIH) 44 0 - 99 mg/dL  Vitamin B12     Status: None   Collection Time: 05/15/23 10:50 AM  Result Value Ref Range   Vitamin B-12 CANCELED pg/mL    Comment: [facility] was unable to collect sufficient specimen to perform the following test(s), and is providing the patient with re-collection instructions.  Result canceled by the ancillary.   Hemoglobin A1c     Status: Abnormal   Collection Time: 05/15/23 10:50 AM  Result Value Ref Range   Hgb A1c MFr Bld 6.1 (H) 4.8 - 5.6 %    Comment:          Prediabetes: 5.7 - 6.4          Diabetes: >6.4          Glycemic control for adults with diabetes: <7.0    Est. average glucose Bld gHb Est-mCnc 128 mg/dL      Assessment & Plan:  Increase dose of lisinopril  to 20 mg/day.  Continue rest of medications. Problem List Items Addressed This Visit     Hypertension associated with diabetes (HCC) - Primary   Relevant Medications   metFORMIN (GLUCOPHAGE) 500 MG tablet   lisinopril  (ZESTRIL ) 20 MG tablet   Congestive heart failure (HCC)   Relevant Medications   lisinopril  (ZESTRIL ) 20 MG tablet   Nicotine  dependence   Type 2 diabetes mellitus with vascular disease (HCC)   Relevant Medications   metFORMIN (GLUCOPHAGE) 500 MG tablet   lisinopril  (ZESTRIL ) 20 MG tablet   Combined hyperlipidemia associated with type 2 diabetes mellitus (HCC)   Relevant Medications   metFORMIN (GLUCOPHAGE) 500 MG tablet   lisinopril  (ZESTRIL ) 20 MG tablet   COPD exacerbation (HCC)    Return in about 3 weeks (around 06/16/2023).   Total time spent: 30 minutes  Aisha Hove, MD  05/26/2023   This document may have been prepared by [facility] Voice Recognition software and as such may include  unintentional dictation errors.

## 2023-05-30 ENCOUNTER — Ambulatory Visit: Payer: Self-pay

## 2023-05-30 DIAGNOSIS — Z5181 Encounter for therapeutic drug level monitoring: Secondary | ICD-10-CM | POA: Diagnosis not present

## 2023-05-30 DIAGNOSIS — Z79899 Other long term (current) drug therapy: Secondary | ICD-10-CM | POA: Diagnosis not present

## 2023-05-30 NOTE — Telephone Encounter (Signed)
 Copied from CRM [id number]. Topic: Clinical - Red Word Triage >> May 30, 2023  4:01 PM Lizabeth Riggs wrote: Red Word that prompted transfer to Nurse Triage:  Angela Daniel is calling to set up a new patient appointment. She has been out of adderall for 2 days. She is throwing up and cannot focus on anything. She is very tired. She is in pain. Pain level is at 8 out of 10.   Chief Complaint: vomiting Symptoms: fatigue, abdominal pain, vomiting, and diarrhe Frequency: constant Pertinent Negatives: Patient denies n Disposition: [] ED /[] Urgent Care (no appt availability in office) / [] Appointment(In office/virtual)/ []  [facility]/ [] Home Care/ [] Refused Recommended Disposition /[] [facility]/ []  Follow-up with PCP Additional Notes: Patient reports vomiting and diarrhea x 2 days and abdominal pain. States she has been out of her adderal for 2 days as well. During the call RN noted patient speech was garbled and very slow, pt advised that she was "just very tired". RN confirmed with patient that she was not alone at this time, pt stated that her daughter was home with her. Pt also reports signs of dehydration. RN advising ED. PT is agreeable. RN offered to call EMS, pt stated that she will have someone take her.    Reason for Disposition  [1] MODERATE vomiting (e.g., 3 - 5 times/day) AND [2] age > 60 years  Answer Assessment - Initial Assessment Questions 1. VOMITING SEVERITY: "How many times have you vomited in the past 24 hours?"     - MILD:  1 - 2 times/day    - MODERATE: 3 - 5 times/day, decreased oral intake without significant weight loss or symptoms of dehydration    - SEVERE: 6 or more times/day, vomits everything or nearly everything, with significant weight loss, symptoms of dehydration      3-4 times, moderate  2. ONSET: "When did the vomiting begin?"      2 days  3. FLUIDS: "What fluids or food have you vomited up today?" "Have you been able to keep any fluids down?"     Can  tolerate fluids  4. ABDOMEN PAIN: "Are your having any abdomen pain?" If Yes : "How bad is it and what does it feel like?" (e.g., crampy, dull, intermittent, constant)      No  5. DIARRHEA: "Is there any diarrhea?" If Yes, ask: "How many times today?"      Some diarrhea, has had 3-4 episodes  6. CONTACTS: "Is there anyone else in the family with the same symptoms?"      No  7. CAUSE: "What do you think is causing your vomiting?"     Out of adderall for 2 days  8. HYDRATION STATUS: "Any signs of dehydration?" (e.g., dry mouth [not only dry lips], too weak to stand) "When did you last urinate?"     Dry mouth, last urinated 1 hour before call  9. OTHER SYMPTOMS: "Do you have any other symptoms?" (e.g., fever, headache, vertigo, vomiting blood or coffee grounds, recent head injury)     Fatigue, body aches,   10. PREGNANCY: "Is there any chance you are pregnant?" "When was your last menstrual period?"       no  Protocols used: Vomiting-A-AH

## 2023-06-14 ENCOUNTER — Other Ambulatory Visit: Payer: Self-pay | Admitting: Internal Medicine

## 2023-06-16 ENCOUNTER — Ambulatory Visit: Admitting: Internal Medicine

## 2023-06-19 DIAGNOSIS — J449 Chronic obstructive pulmonary disease, unspecified: Secondary | ICD-10-CM | POA: Diagnosis not present

## 2023-06-25 DIAGNOSIS — Z79899 Other long term (current) drug therapy: Secondary | ICD-10-CM | POA: Diagnosis not present

## 2023-06-30 ENCOUNTER — Telehealth: Payer: Self-pay

## 2023-06-30 NOTE — Telephone Encounter (Signed)
 Concha Deed with [facility] called asking if you were okay with starting the patient on a stimulant again, not the same dose but lower dose please advise

## 2023-07-01 ENCOUNTER — Other Ambulatory Visit: Payer: Self-pay | Admitting: Cardiovascular Disease

## 2023-07-01 NOTE — Telephone Encounter (Signed)
 Raynelle Fanning informed.

## 2023-07-02 ENCOUNTER — Ambulatory Visit: Admitting: Family Medicine

## 2023-07-02 DIAGNOSIS — Z79899 Other long term (current) drug therapy: Secondary | ICD-10-CM | POA: Diagnosis not present

## 2023-07-02 DIAGNOSIS — Z5181 Encounter for therapeutic drug level monitoring: Secondary | ICD-10-CM | POA: Diagnosis not present

## 2023-07-03 ENCOUNTER — Other Ambulatory Visit: Payer: Self-pay

## 2023-07-03 MED ORDER — GABAPENTIN 800 MG PO TABS: 800.0000 mg | ORAL_TABLET | Freq: Three times a day (TID) | ORAL | 1 refills | Status: AC

## 2023-07-07 ENCOUNTER — Ambulatory Visit: Admitting: Family Medicine

## 2023-07-09 DIAGNOSIS — Z79899 Other long term (current) drug therapy: Secondary | ICD-10-CM | POA: Diagnosis not present

## 2023-07-09 DIAGNOSIS — Z5181 Encounter for therapeutic drug level monitoring: Secondary | ICD-10-CM | POA: Diagnosis not present

## 2023-07-20 DIAGNOSIS — J449 Chronic obstructive pulmonary disease, unspecified: Secondary | ICD-10-CM | POA: Diagnosis not present

## 2023-07-21 DIAGNOSIS — Z79899 Other long term (current) drug therapy: Secondary | ICD-10-CM | POA: Diagnosis not present

## 2023-07-29 ENCOUNTER — Other Ambulatory Visit: Payer: Self-pay | Admitting: Internal Medicine

## 2023-08-04 DIAGNOSIS — Z79899 Other long term (current) drug therapy: Secondary | ICD-10-CM | POA: Diagnosis not present

## 2023-08-04 DIAGNOSIS — Z5181 Encounter for therapeutic drug level monitoring: Secondary | ICD-10-CM | POA: Diagnosis not present

## 2023-08-18 DIAGNOSIS — D8489 Other immunodeficiencies: Secondary | ICD-10-CM | POA: Diagnosis not present

## 2023-08-18 DIAGNOSIS — R12 Heartburn: Secondary | ICD-10-CM | POA: Diagnosis not present

## 2023-08-18 DIAGNOSIS — R197 Diarrhea, unspecified: Secondary | ICD-10-CM | POA: Diagnosis not present

## 2023-08-19 ENCOUNTER — Encounter: Payer: Self-pay | Admitting: Cardiology

## 2023-08-19 ENCOUNTER — Ambulatory Visit (INDEPENDENT_AMBULATORY_CARE_PROVIDER_SITE_OTHER): Admitting: Cardiology

## 2023-08-19 VITALS — BP 132/88 | HR 73 | Ht 63.0 in | Wt 164.6 lb

## 2023-08-19 DIAGNOSIS — I5040 Unspecified combined systolic (congestive) and diastolic (congestive) heart failure: Secondary | ICD-10-CM | POA: Diagnosis not present

## 2023-08-19 DIAGNOSIS — E1169 Type 2 diabetes mellitus with other specified complication: Secondary | ICD-10-CM

## 2023-08-19 DIAGNOSIS — R0602 Shortness of breath: Secondary | ICD-10-CM | POA: Diagnosis not present

## 2023-08-19 DIAGNOSIS — I152 Hypertension secondary to endocrine disorders: Secondary | ICD-10-CM

## 2023-08-19 DIAGNOSIS — J449 Chronic obstructive pulmonary disease, unspecified: Secondary | ICD-10-CM | POA: Diagnosis not present

## 2023-08-19 NOTE — Progress Notes (Signed)
 Cardiology Office Note   Date:  08/19/2023   ID:  Lynnley, Doddridge Nov 10, 1955, MRN [id number]  PCP:  Fernand Fredy RAMAN, MD  Cardiologist:  Jeoffrey Pollen, NP      History of Present Illness: Angela Daniel is a 68 y.o. female who presents for  Chief Complaint  Patient presents with   Acute Visit    SOB started a couple weeks ago.     Patient in office for an acute visit, complaining of weakness, shortness of breath. Patient has a history of CHF, COPD, continues to smoke. Stress test and echocardiogram ordered in 09/2022, did not have done. Patient reports being compliant with her medications.       Past Medical History:  Diagnosis Date   Congestive heart failure (HCC)    COPD (chronic obstructive pulmonary disease) (HCC)    Degenerative joint disease    Diabetes mellitus without complication (HCC)    Fibromyalgia    GERD (gastroesophageal reflux disease)    Hypertension    Scoliosis    Sleep apnea      Past Surgical History:  Procedure Laterality Date   ABDOMINAL HYSTERECTOMY     APPENDECTOMY     CESAREAN SECTION     x3   CHOLECYSTECTOMY     HIP SURGERY Left    joint replacement   KYPHOPLASTY N/A 05/18/2019   Procedure: T6 KYPHOPLASTY;  Surgeon: Kathlynn Sharper, MD;  Location: [facility] ORS;  Service: Orthopedics;  Laterality: N/A;     Current Outpatient Medications  Medication Sig Dispense Refill   albuterol  (VENTOLIN  HFA) 108 (90 Base) MCG/ACT inhaler Inhale 2 puffs into the lungs every 4 (four) hours as needed. 18 g 3   alprazolam  (XANAX ) 2 MG tablet Take 2 mg by mouth 3 (three) times daily.     amphetamine -dextroamphetamine  (ADDERALL) 20 MG tablet Take 20 mg by mouth daily.     ARIPiprazole  (ABILIFY ) 10 MG tablet Take 10 mg by mouth daily.     aspirin  EC 81 MG tablet Take 81 mg by mouth daily.     blood glucose meter kit and supplies KIT Dispense based on patient and insurance preference. Use up to four times daily as directed. Check blood sugar three times  a day 1 each 0   buprenorphine  (SUBUTEX ) 8 MG SUBL SL tablet Place 8 mg under the tongue 3 (three) times daily.     Cholecalciferol  (VITAMIN D3) 50 MCG (2000 UT) TABS Take 2,000 mg by mouth daily.     Cyanocobalamin  2500 MCG CHEW Chew 2,500 mcg by mouth daily.     diclofenac  (VOLTAREN ) 50 MG EC tablet Take 50 mg by mouth 2 (two) times daily as needed for mild pain (pain score 1-3).     esomeprazole  (NEXIUM ) 40 MG capsule TAKE 1 CAPSULE BY MOUTH ONCE DAILY 90 capsule 1   fluticasone  (FLONASE ) 50 MCG/ACT nasal spray Place 1 spray into both nostrils at bedtime.     Fluticasone -Salmeterol (ADVAIR) 250-50 MCG/DOSE AEPB Inhale 1 puff into the lungs 2 (two) times daily.     gabapentin  (NEURONTIN ) 800 MG tablet Take 1 tablet (800 mg total) by mouth 3 (three) times daily. 270 tablet 1   isosorbide  mononitrate (IMDUR ) 30 MG 24 hr tablet TAKE 1 TABLET BY MOUTH ONCE DAILY 30 tablet 5   levETIRAcetam  (KEPPRA ) 500 MG tablet TAKE 1 TABLET BY MOUTH TWICE DAILY 180 tablet 3   lisinopril  (ZESTRIL ) 20 MG tablet Take 1 tablet (20 mg total) by mouth daily.  30 tablet 11   magnesium  oxide (MAG-OX) 400 (240 Mg) MG tablet Take 1 tablet (400 mg total) by mouth 2 (two) times daily. 30 tablet 0   metFORMIN  (GLUCOPHAGE ) 500 MG tablet Take 1 tablet (500 mg total) by mouth 2 (two) times daily with a meal. 180 tablet 3   metoprolol  succinate (TOPROL -XL) 50 MG 24 hr tablet TAKE 1 TABLET BY MOUTH ONCE DAILY 90 tablet 3   Omega-3 Fatty Acids (FISH OIL) 500 MG CAPS Take 1 capsule by mouth 1 day or 1 dose.     ondansetron  (ZOFRAN -ODT) 4 MG disintegrating tablet Take 4 mg by mouth every 6 (six) hours as needed.     rosuvastatin  (CRESTOR ) 20 MG tablet TAKE 1 TABLET BY MOUTH ONCE EVERY EVENING 90 tablet 0   SPIRIVA  HANDIHALER 18 MCG inhalation capsule Place 18 mcg into inhaler and inhale daily.     spironolactone  (ALDACTONE ) 25 MG tablet TAKE 1/2 TABLET BY MOUTH EVERY DAY 45 tablet 2   tiZANidine  (ZANAFLEX ) 4 MG tablet TAKE 1 TABLET BY  MOUTH EVERY 6 HOURS AS NEEDED 60 tablet 1   torsemide  (DEMADEX ) 20 MG tablet TAKE 1 TABLET BY MOUTH TWICE DAILY 120 tablet 4   Vitamin E  400 units TABS Take 400 Units by mouth daily.     predniSONE  (DELTASONE ) 20 MG tablet Take 2 tablets (40 mg total) by mouth daily with breakfast. (Patient not taking: Reported on 08/19/2023) 10 tablet 0   No current facility-administered medications for this visit.    Allergies:   Tramadol, Tylenol  [acetaminophen ], and Vicodin [hydrocodone -acetaminophen ]    Social History:   reports that she has been smoking cigarettes. She has never used smokeless tobacco. She reports that she does not drink alcohol and does not use drugs.   Family History:  family history includes Asthma in her father; CVA in her mother; Diabetes in her mother; Heart attack in her father; Hypertension in her father; Lung cancer in her father and mother.    ROS:     Review of Systems  Constitutional: Negative.   HENT: Negative.    Eyes: Negative.   Respiratory:  Positive for shortness of breath.   Cardiovascular: Negative.  Negative for chest pain and leg swelling.  Gastrointestinal: Negative.  Negative for abdominal pain, constipation and diarrhea.  Genitourinary: Negative.   Musculoskeletal:  Negative for joint pain and myalgias.  Skin: Negative.   Neurological: Negative.  Negative for dizziness and headaches.  Endo/Heme/Allergies: Negative.   All other systems reviewed and are negative.     All other systems are reviewed and negative.    PHYSICAL EXAM: VS:  BP 132/88   Pulse 73   Ht 5' 3 (1.6 m)   Wt 164 lb 9.6 oz (74.7 kg)   SpO2 97%   BMI 29.16 kg/m  , BMI Body mass index is 29.16 kg/m. Last weight:  Wt Readings from Last 3 Encounters:  08/19/23 164 lb 9.6 oz (74.7 kg)  05/26/23 168 lb 12.8 oz (76.6 kg)  05/15/23 167 lb 9.6 oz (76 kg)     Physical Exam Vitals and nursing note reviewed.  Constitutional:      Appearance: Normal appearance. She is normal  weight.  HENT:     Head: Normocephalic and atraumatic.     Nose: Nose normal.     Mouth/Throat:     Mouth: Mucous membranes are moist.     Pharynx: Oropharynx is clear.  Eyes:     Conjunctiva/sclera: Conjunctivae normal.  Pupils: Pupils are equal, round, and reactive to light.  Cardiovascular:     Rate and Rhythm: Normal rate and regular rhythm.     Pulses: Normal pulses.     Heart sounds: Normal heart sounds.  Pulmonary:     Effort: Pulmonary effort is normal.     Breath sounds: Normal breath sounds.  Abdominal:     General: Abdomen is flat. Bowel sounds are normal.     Palpations: Abdomen is soft.  Musculoskeletal:        General: Normal range of motion.     Cervical back: Normal range of motion.  Skin:    General: Skin is warm and dry.  Neurological:     General: No focal deficit present.     Mental Status: She is alert and oriented to person, place, and time. Mental status is at baseline.  Psychiatric:        Mood and Affect: Mood normal.        Behavior: Behavior normal.       EKG:   Recent Labs: 05/15/2023: ALT 32; BUN 21; Creatinine, Ser 0.86; Hemoglobin 12.9; Platelets 193; Potassium 4.2; Sodium 145    Lipid Panel    Component Value Date/Time   CHOL 124 05/15/2023 1050   TRIG 88 05/15/2023 1050   HDL 63 05/15/2023 1050   LDLCALC 44 05/15/2023 1050      Other studies Reviewed: Echo 10/2021 EF 70.5%, grade I DD, mild MVR   ASSESSMENT AND PLAN:    ICD-10-CM   1. Combined systolic and diastolic congestive heart failure, unspecified HF chronicity (HCC)  I50.40 PCV ECHOCARDIOGRAM COMPLETE    MYOCARDIAL PERFUSION IMAGING    2. SOB (shortness of breath)  R06.02 PCV ECHOCARDIOGRAM COMPLETE    MYOCARDIAL PERFUSION IMAGING       Problem List Items Addressed This Visit       Cardiovascular and Mediastinum   Congestive heart failure (HCC) - Primary   Patient complaining of shortness of breath and weakness. Did not have stress test or echo done as  ordered in 09/2022. Patient will scheduled both and return in two weeks to discuss results. Continue same medications.       Relevant Orders   PCV ECHOCARDIOGRAM COMPLETE   MYOCARDIAL PERFUSION IMAGING     Other   SOB (shortness of breath)   Continue same medications.       Relevant Orders   PCV ECHOCARDIOGRAM COMPLETE   MYOCARDIAL PERFUSION IMAGING     Disposition:   Return if symptoms worsen or fail to improve, for after echo and stress test with cards.    Total time spent: 25 minutes  Signed,  Jeoffrey Pollen, NP  08/19/2023 12:19 PM    [facility]

## 2023-08-19 NOTE — Assessment & Plan Note (Signed)
 Patient complaining of shortness of breath and weakness. Did not have stress test or echo done as ordered in 09/2022. Patient will scheduled both and return in two weeks to discuss results. Continue same medications.

## 2023-08-19 NOTE — Assessment & Plan Note (Signed)
 Continue same medications.

## 2023-08-25 DIAGNOSIS — Z79899 Other long term (current) drug therapy: Secondary | ICD-10-CM | POA: Diagnosis not present

## 2023-09-04 ENCOUNTER — Ambulatory Visit
Admission: RE | Admit: 2023-09-04 | Discharge: 2023-09-04 | Disposition: A | Discharge status: Home / Self Care | Source: Ambulatory Visit | Attending: Internal Medicine | Admitting: Internal Medicine

## 2023-09-04 DIAGNOSIS — M810 Age-related osteoporosis without current pathological fracture: Secondary | ICD-10-CM | POA: Diagnosis not present

## 2023-09-04 DIAGNOSIS — Z13820 Encounter for screening for osteoporosis: Secondary | ICD-10-CM | POA: Insufficient documentation

## 2023-09-04 DIAGNOSIS — Z1231 Encounter for screening mammogram for malignant neoplasm of breast: Secondary | ICD-10-CM | POA: Diagnosis not present

## 2023-09-04 DIAGNOSIS — Z780 Asymptomatic menopausal state: Secondary | ICD-10-CM | POA: Insufficient documentation

## 2023-09-05 ENCOUNTER — Other Ambulatory Visit: Payer: Self-pay | Admitting: Internal Medicine

## 2023-09-05 ENCOUNTER — Other Ambulatory Visit: Payer: Self-pay | Admitting: Cardiovascular Disease

## 2023-09-05 ENCOUNTER — Ambulatory Visit: Payer: Self-pay | Admitting: Internal Medicine

## 2023-09-05 DIAGNOSIS — E1159 Type 2 diabetes mellitus with other circulatory complications: Secondary | ICD-10-CM

## 2023-09-05 DIAGNOSIS — G40909 Epilepsy, unspecified, not intractable, without status epilepticus: Secondary | ICD-10-CM

## 2023-09-05 NOTE — Progress Notes (Signed)
 Patient notified

## 2023-09-07 ENCOUNTER — Other Ambulatory Visit: Payer: Self-pay | Admitting: Internal Medicine

## 2023-09-08 ENCOUNTER — Ambulatory Visit

## 2023-09-08 ENCOUNTER — Other Ambulatory Visit: Payer: Self-pay | Admitting: Internal Medicine

## 2023-09-08 ENCOUNTER — Other Ambulatory Visit: Payer: Self-pay

## 2023-09-08 DIAGNOSIS — I5040 Unspecified combined systolic (congestive) and diastolic (congestive) heart failure: Secondary | ICD-10-CM

## 2023-09-08 DIAGNOSIS — R0602 Shortness of breath: Secondary | ICD-10-CM

## 2023-09-08 MED ORDER — TECHNETIUM TC 99M SESTAMIBI GENERIC - CARDIOLITE
10.0000 | Freq: Once | INTRAVENOUS | Status: AC | PRN
  Administered 2023-09-08: 10 via INTRAVENOUS

## 2023-09-08 MED ORDER — TECHNETIUM TC 99M SESTAMIBI GENERIC - CARDIOLITE
30.0000 | Freq: Once | INTRAVENOUS | Status: AC | PRN
  Administered 2023-09-08: 30 via INTRAVENOUS

## 2023-09-12 ENCOUNTER — Ambulatory Visit (INDEPENDENT_AMBULATORY_CARE_PROVIDER_SITE_OTHER): Admitting: Internal Medicine

## 2023-09-12 ENCOUNTER — Encounter: Payer: Self-pay | Admitting: Internal Medicine

## 2023-09-12 VITALS — BP 140/84 | HR 78 | Ht 63.0 in | Wt 165.2 lb

## 2023-09-12 DIAGNOSIS — G8929 Other chronic pain: Secondary | ICD-10-CM | POA: Insufficient documentation

## 2023-09-12 DIAGNOSIS — M810 Age-related osteoporosis without current pathological fracture: Secondary | ICD-10-CM | POA: Insufficient documentation

## 2023-09-12 DIAGNOSIS — F17200 Nicotine dependence, unspecified, uncomplicated: Secondary | ICD-10-CM

## 2023-09-12 DIAGNOSIS — M25512 Pain in left shoulder: Secondary | ICD-10-CM | POA: Diagnosis not present

## 2023-09-12 DIAGNOSIS — J441 Chronic obstructive pulmonary disease with (acute) exacerbation: Secondary | ICD-10-CM | POA: Diagnosis not present

## 2023-09-12 DIAGNOSIS — I152 Hypertension secondary to endocrine disorders: Secondary | ICD-10-CM | POA: Diagnosis not present

## 2023-09-12 DIAGNOSIS — R0602 Shortness of breath: Secondary | ICD-10-CM

## 2023-09-12 DIAGNOSIS — E1159 Type 2 diabetes mellitus with other circulatory complications: Secondary | ICD-10-CM

## 2023-09-12 DIAGNOSIS — M7502 Adhesive capsulitis of left shoulder: Secondary | ICD-10-CM | POA: Insufficient documentation

## 2023-09-12 MED ORDER — DENOSUMAB 60 MG/ML ~~LOC~~ SOSY: 60.0000 mg | PREFILLED_SYRINGE | Freq: Once | SUBCUTANEOUS | 0 refills | Status: AC

## 2023-09-12 MED ORDER — PREDNISONE 20 MG PO TABS: 40.0000 mg | ORAL_TABLET | Freq: Every day | ORAL | 0 refills | Status: DC

## 2023-09-12 NOTE — Progress Notes (Signed)
 Established Patient Office Visit  Subjective:  Patient ID: Angela Daniel, female    DOB: 06-27-1955  Age: 68 y.o. MRN: [id number]  Chief Complaint  Patient presents with   Follow-up    Discuss lab results    Patient is seen today to discuss lab results. She recently had her mammogram and Dexa bone density scans completed. Her Dexa scan showed Osteoporosis of the spine. She recently completed cardiac stress test for shortness of breath that was normal and showed LVEF of 72%. She has a echo scheduled for 09/19/23. Patient encouraged to keep this appointment. There is no history of a low dose chest CT done, as patient is a long-term smoker. Will discuss at follow up and get that scheduled at that time.  Patient reports she is still having shortness of breath with walking. She reports she is using her inhalers and taking her medications as prescribed. She also reports the last 4 days she has had L shoulder pain, with pain radiating down left arm, up her neck, and her upper back. She has a history of chronic L shoulder pain that she was being seen by Orthopedics in 2022 where they completed an MRI. She currently states she has been taking tylenol  and ibuprofen  around the clock without relief and she has decreased ROM. It is impairing her sleep at night.    No other concerns at this time.   Past Medical History:  Diagnosis Date   Congestive heart failure (HCC)    COPD (chronic obstructive pulmonary disease) (HCC)    Degenerative joint disease    Diabetes mellitus without complication (HCC)    Fibromyalgia    GERD (gastroesophageal reflux disease)    Hypertension    Scoliosis    Sleep apnea     Past Surgical History:  Procedure Laterality Date   ABDOMINAL HYSTERECTOMY     APPENDECTOMY     CESAREAN SECTION     x3   CHOLECYSTECTOMY     HIP SURGERY Left    joint replacement   KYPHOPLASTY N/A 05/18/2019   Procedure: T6 KYPHOPLASTY;  Surgeon: Kathlynn Sharper, MD;  Location: [facility] ORS;   Service: Orthopedics;  Laterality: N/A;    Social History   Socioeconomic History   Marital status: Single    Spouse name: Not on file   Number of children: Not on file   Years of education: Not on file   Highest education level: Not on file  Occupational History   Not on file  Tobacco Use   Smoking status: Every Day    Current packs/day: 0.50    Types: Cigarettes   Smokeless tobacco: Never  Vaping Use   Vaping status: Never Used  Substance and Sexual Activity   Alcohol use: No   Drug use: No   Sexual activity: Not on file  Other Topics Concern   Not on file  Social History Narrative   Not on file   Social Drivers of Health   Financial Resource Strain: Not on file  Food Insecurity: Not on file  Transportation Needs: Not on file  Physical Activity: Not on file  Stress: Not on file  Social Connections: Not on file  Intimate Partner Violence: Not on file    Family History  Problem Relation Age of Onset   CVA Mother    Lung cancer Mother    Diabetes Mother    Heart attack Father    Hypertension Father    Lung cancer Father    Asthma Father  Breast cancer Neg Hx     Allergies  Allergen Reactions   Tramadol Hives   Tylenol  [Acetaminophen ] Nausea And Vomiting   Vicodin [Hydrocodone -Acetaminophen ] Hives    Outpatient Medications Prior to Visit  Medication Sig   albuterol  (VENTOLIN  HFA) 108 (90 Base) MCG/ACT inhaler INHALE 1-2 PUFFS BY MOUTH EVERY 4-6 HOURS AS NEEDED AS DIRECTED *NEW PRESCRIPTION REQUEST*   alprazolam  (XANAX ) 2 MG tablet Take 2 mg by mouth 3 (three) times daily.   amphetamine -dextroamphetamine  (ADDERALL) 30 MG tablet Take 1 tablet by mouth 2 (two) times daily.   ARIPiprazole  (ABILIFY ) 15 MG tablet Take 7.5 mg by mouth daily.   aspirin  EC 81 MG tablet Take 81 mg by mouth daily.   blood glucose meter kit and supplies KIT Dispense based on patient and insurance preference. Use up to four times daily as directed. Check blood sugar three times a  day   buprenorphine  (SUBUTEX ) 8 MG SUBL SL tablet Place 8 mg under the tongue 3 (three) times daily.   Cholecalciferol  (VITAMIN D3) 50 MCG (2000 UT) TABS Take 2,000 mg by mouth daily.   Cyanocobalamin  2500 MCG CHEW Chew 2,500 mcg by mouth daily.   esomeprazole  (NEXIUM ) 40 MG capsule TAKE 1 CAPSULE(S) BY MOUTH 1 TIMES PER DAY *NEW PRESCRIPTION REQUEST*   fluticasone  (FLONASE ) 50 MCG/ACT nasal spray Place 1 spray into both nostrils at bedtime.   Fluticasone -Salmeterol (ADVAIR) 250-50 MCG/DOSE AEPB Inhale 1 puff into the lungs 2 (two) times daily.   gabapentin  (NEURONTIN ) 800 MG tablet Take 1 tablet (800 mg total) by mouth 3 (three) times daily.   isosorbide  mononitrate (IMDUR ) 30 MG 24 hr tablet TAKE 1 TABLET(S) BY MOUTH 1 TIMES PER DAY *NEW PRESCRIPTION REQUEST*   levETIRAcetam  (KEPPRA ) 500 MG tablet TAKE 1 TABLET(S) BY MOUTH 2 TIMES PER DAY *NEW PRESCRIPTION REQUEST*   lisinopril  (ZESTRIL ) 20 MG tablet TAKE 1 TABLET(S) BY MOUTH 1 TIMES PER DAY *NEW PRESCRIPTION REQUEST*   magnesium  oxide (MAG-OX) 400 (240 Mg) MG tablet Take 1 tablet (400 mg total) by mouth 2 (two) times daily.   metFORMIN  (GLUCOPHAGE ) 500 MG tablet TAKE 1 TABLET(S) BY MOUTH 2 TIMES PER DAY *NEW PRESCRIPTION REQUEST*   metoprolol  succinate (TOPROL -XL) 50 MG 24 hr tablet TAKE 0.5 TABLET BY MOUTH 1 TIMES PER DAY *NEW PRESCRIPTION REQUEST*   ondansetron  (ZOFRAN -ODT) 4 MG disintegrating tablet Take 4 mg by mouth every 6 (six) hours as needed.   rosuvastatin  (CRESTOR ) 20 MG tablet TAKE 1 TABLET BY MOUTH ONCE EVERY EVENING   SPIRIVA  HANDIHALER 18 MCG inhalation capsule Place 18 mcg into inhaler and inhale daily.   spironolactone  (ALDACTONE ) 25 MG tablet TAKE 1/2 TABLET BY MOUTH EVERY DAY   tiZANidine  (ZANAFLEX ) 4 MG tablet TAKE 1 TABLET(S) BY MOUTH 4 TIMES PER DAY (EVERY 6 HOURS) AS NEEDED AS DIRECTED. *NEW PRESCRIPTION REQUEST*   torsemide  (DEMADEX ) 20 MG tablet TAKE 1 TABLET(S) BY MOUTH 2 TIMES PER DAY *NEW PRESCRIPTION REQUEST*    Vitamin E  400 units TABS Take 400 Units by mouth daily.   [DISCONTINUED] amphetamine -dextroamphetamine  (ADDERALL) 20 MG tablet Take 20 mg by mouth daily.   [DISCONTINUED] predniSONE  (DELTASONE ) 20 MG tablet Take 2 tablets (40 mg total) by mouth daily with breakfast. (Patient not taking: Reported on 09/12/2023)   No facility-administered medications prior to visit.    Review of Systems  Constitutional: Negative.   HENT: Negative.    Eyes: Negative.   Respiratory:  Positive for shortness of breath (with walking). Negative for cough.   Cardiovascular:  Negative.  Negative for chest pain, palpitations and leg swelling.  Gastrointestinal: Negative.  Negative for abdominal pain, constipation, diarrhea, heartburn, nausea and vomiting.  Genitourinary: Negative.  Negative for dysuria and flank pain.  Musculoskeletal:  Positive for joint pain (L shoulder). Negative for myalgias.  Skin: Negative.   Neurological: Negative.  Negative for dizziness and headaches.  Endo/Heme/Allergies: Negative.   Psychiatric/Behavioral: Negative.  Negative for depression and suicidal ideas. The patient is not nervous/anxious.        Objective:   BP (!) 140/84   Pulse 78   Ht 5' 3 (1.6 m)   Wt 165 lb 3.2 oz (74.9 kg)   SpO2 99%   BMI 29.26 kg/m   Vitals:   09/12/23 1101  BP: (!) 140/84  Pulse: 78  Height: 5' 3 (1.6 m)  Weight: 165 lb 3.2 oz (74.9 kg)  SpO2: 99%  BMI (Calculated): 29.27    Physical Exam Vitals and nursing note reviewed.  Constitutional:      Appearance: Normal appearance.  HENT:     Head: Normocephalic and atraumatic.     Nose: Nose normal.     Mouth/Throat:     Mouth: Mucous membranes are moist.     Pharynx: Oropharynx is clear.  Eyes:     Conjunctiva/sclera: Conjunctivae normal.     Pupils: Pupils are equal, round, and reactive to light.  Cardiovascular:     Rate and Rhythm: Normal rate and regular rhythm.     Pulses: Normal pulses.     Heart sounds: Normal heart sounds.  No murmur heard. Pulmonary:     Effort: Pulmonary effort is normal.     Breath sounds: Normal breath sounds. No wheezing.  Abdominal:     General: Bowel sounds are normal.     Palpations: Abdomen is soft.     Tenderness: There is no abdominal tenderness. There is no right CVA tenderness or left CVA tenderness.  Musculoskeletal:     Right shoulder: Normal.     Left shoulder: Tenderness present. Decreased range of motion.     Cervical back: Normal range of motion.     Right lower leg: No edema.     Left lower leg: No edema.  Skin:    General: Skin is warm and dry.  Neurological:     General: No focal deficit present.     Mental Status: She is alert and oriented to person, place, and time.  Psychiatric:        Mood and Affect: Mood normal.        Behavior: Behavior normal.      No results found for any visits on 09/12/23.  No results found for this or any previous visit (from the past 2160 hours).    Assessment & Plan:  Patient had similar L shoulder pain in 2022 and was seen by Orthopedics where they did an MRI. Start prednisone  40 mg once daily for 5 days. Stop taking Ibuprofen . Encouraged to do ROM exercises daily for L shoulder. Referral for Physical Therapy. Follow up in 10 days, if symptoms persist or worsen will refer back to Orthopedics. Start Prolia  for Osteoporosis. 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   Total time spent: 30 minutes  FERNAND FREDY RAMAN, MD  09/12/2023   This document may have been prepared by [facility] Voice Recognition software and as  such may include unintentional dictation errors.

## 2023-09-13 ENCOUNTER — Other Ambulatory Visit: Payer: Self-pay | Admitting: Internal Medicine

## 2023-09-13 MED ORDER — METHYLPREDNISOLONE 4 MG PO TBPK
ORAL_TABLET | ORAL | 0 refills | Status: DC
Start: 2023-09-13 — End: 2023-09-26

## 2023-09-15 ENCOUNTER — Encounter: Payer: Self-pay | Admitting: Internal Medicine

## 2023-09-15 ENCOUNTER — Ambulatory Visit: Admitting: Internal Medicine

## 2023-09-15 DIAGNOSIS — M810 Age-related osteoporosis without current pathological fracture: Secondary | ICD-10-CM | POA: Diagnosis not present

## 2023-09-15 MED ORDER — DENOSUMAB 60 MG/ML ~~LOC~~ SOSY
60.0000 mg | PREFILLED_SYRINGE | SUBCUTANEOUS | Status: AC
  Administered 2023-09-15: 60 mg via SUBCUTANEOUS

## 2023-09-15 NOTE — Progress Notes (Signed)
 Prolia  inj given for Osteoporosis Patient brought in her inj

## 2023-09-19 ENCOUNTER — Ambulatory Visit

## 2023-09-19 DIAGNOSIS — I361 Nonrheumatic tricuspid (valve) insufficiency: Secondary | ICD-10-CM | POA: Diagnosis not present

## 2023-09-19 DIAGNOSIS — I5040 Unspecified combined systolic (congestive) and diastolic (congestive) heart failure: Secondary | ICD-10-CM

## 2023-09-19 DIAGNOSIS — R0602 Shortness of breath: Secondary | ICD-10-CM

## 2023-09-19 DIAGNOSIS — I340 Nonrheumatic mitral (valve) insufficiency: Secondary | ICD-10-CM | POA: Diagnosis not present

## 2023-09-19 DIAGNOSIS — J449 Chronic obstructive pulmonary disease, unspecified: Secondary | ICD-10-CM | POA: Diagnosis not present

## 2023-09-23 ENCOUNTER — Encounter: Payer: Self-pay | Admitting: Internal Medicine

## 2023-09-23 ENCOUNTER — Ambulatory Visit: Payer: Self-pay | Admitting: Internal Medicine

## 2023-09-23 ENCOUNTER — Ambulatory Visit (INDEPENDENT_AMBULATORY_CARE_PROVIDER_SITE_OTHER): Admitting: Internal Medicine

## 2023-09-23 VITALS — BP 152/90 | HR 69 | Ht 63.0 in | Wt 165.6 lb

## 2023-09-23 DIAGNOSIS — G8929 Other chronic pain: Secondary | ICD-10-CM | POA: Diagnosis not present

## 2023-09-23 DIAGNOSIS — F418 Other specified anxiety disorders: Secondary | ICD-10-CM

## 2023-09-23 DIAGNOSIS — E1159 Type 2 diabetes mellitus with other circulatory complications: Secondary | ICD-10-CM | POA: Diagnosis not present

## 2023-09-23 DIAGNOSIS — F17200 Nicotine dependence, unspecified, uncomplicated: Secondary | ICD-10-CM | POA: Diagnosis not present

## 2023-09-23 DIAGNOSIS — M25512 Pain in left shoulder: Secondary | ICD-10-CM | POA: Diagnosis not present

## 2023-09-23 DIAGNOSIS — I152 Hypertension secondary to endocrine disorders: Secondary | ICD-10-CM

## 2023-09-23 DIAGNOSIS — E782 Mixed hyperlipidemia: Secondary | ICD-10-CM | POA: Diagnosis not present

## 2023-09-23 DIAGNOSIS — M2550 Pain in unspecified joint: Secondary | ICD-10-CM | POA: Insufficient documentation

## 2023-09-23 DIAGNOSIS — E1169 Type 2 diabetes mellitus with other specified complication: Secondary | ICD-10-CM

## 2023-09-23 LAB — POCT CBG (FASTING - GLUCOSE)-MANUAL ENTRY: Glucose Fasting, POC: 118 mg/dL — AB (ref 70–99)

## 2023-09-23 MED ORDER — AMLODIPINE BESYLATE 5 MG PO TABS: 5.0000 mg | ORAL_TABLET | Freq: Every day | ORAL | 11 refills | Status: DC

## 2023-09-23 MED ORDER — CELECOXIB 200 MG PO CAPS: 200.0000 mg | ORAL_CAPSULE | Freq: Every day | ORAL | 2 refills | Status: DC

## 2023-09-23 NOTE — Progress Notes (Signed)
 Established Patient Office Visit  Subjective:  Patient ID: Angela Daniel, female    DOB: 09-17-1955  Age: 68 y.o. MRN: [id number]  Chief Complaint  Patient presents with   Follow-up    10 day follow up    Patient was seen today for follow up on completion of oral steroids. She reports the prednisone  caused her blood pressure to go up so she called and it was switched to Medrol  dose pack. She completed the course as prescribed. She is still endorsing pain in L shoulder and down the arm, as well as both lower extremities that wake her up from her sleep. Will check routine labs today and add CPK, Sed rate and arthritis panel. Will also start Celebrex  200 mg once daily and send pain clinic referral. Patients blood pressure today is still elevated. Will start Amlodipine  5 mg once daily and monitor blood pressure closely at home.  Patient is due for her low dose chest CT, will order for her lung cancer screening due to current long term smoking history. Patient states her current Psychiatrist needs a referral to continuing seeing her as a patient for insurance coverage. Will send Psychiatrist referral. Patient denies any chest pain, shortness of breath, nausea, vomiting, diarrhea/constipation.      No other concerns at this time.   Past Medical History:  Diagnosis Date   Congestive heart failure (HCC)    COPD (chronic obstructive pulmonary disease) (HCC)    Degenerative joint disease    Diabetes mellitus without complication (HCC)    Fibromyalgia    GERD (gastroesophageal reflux disease)    Hypertension    Scoliosis    Sleep apnea     Past Surgical History:  Procedure Laterality Date   ABDOMINAL HYSTERECTOMY     APPENDECTOMY     CESAREAN SECTION     x3   CHOLECYSTECTOMY     HIP SURGERY Left    joint replacement   KYPHOPLASTY N/A 05/18/2019   Procedure: T6 KYPHOPLASTY;  Surgeon: Kathlynn Sharper, MD;  Location: [facility] ORS;  Service: Orthopedics;  Laterality: N/A;    Social History    Socioeconomic History   Marital status: Single    Spouse name: Not on file   Number of children: Not on file   Years of education: Not on file   Highest education level: Not on file  Occupational History   Not on file  Tobacco Use   Smoking status: Every Day    Current packs/day: 0.50    Types: Cigarettes   Smokeless tobacco: Never  Vaping Use   Vaping status: Never Used  Substance and Sexual Activity   Alcohol use: No   Drug use: No   Sexual activity: Not on file  Other Topics Concern   Not on file  Social History Narrative   Not on file   Social Drivers of Health   Financial Resource Strain: Not on file  Food Insecurity: Not on file  Transportation Needs: Not on file  Physical Activity: Not on file  Stress: Not on file  Social Connections: Not on file  Intimate Partner Violence: Not on file    Family History  Problem Relation Age of Onset   CVA Mother    Lung cancer Mother    Diabetes Mother    Heart attack Father    Hypertension Father    Lung cancer Father    Asthma Father    Breast cancer Neg Hx     Allergies  Allergen Reactions  Tramadol Hives   Tylenol  [Acetaminophen ] Nausea And Vomiting   Vicodin [Hydrocodone -Acetaminophen ] Hives    Outpatient Medications Prior to Visit  Medication Sig   albuterol  (VENTOLIN  HFA) 108 (90 Base) MCG/ACT inhaler INHALE 1-2 PUFFS BY MOUTH EVERY 4-6 HOURS AS NEEDED AS DIRECTED *NEW PRESCRIPTION REQUEST*   alprazolam  (XANAX ) 2 MG tablet Take 2 mg by mouth 3 (three) times daily.   amphetamine -dextroamphetamine  (ADDERALL) 30 MG tablet Take 1 tablet by mouth 2 (two) times daily.   ARIPiprazole  (ABILIFY ) 15 MG tablet Take 7.5 mg by mouth daily.   aspirin  EC 81 MG tablet Take 81 mg by mouth daily.   blood glucose meter kit and supplies KIT Dispense based on patient and insurance preference. Use up to four times daily as directed. Check blood sugar three times a day   buprenorphine  (SUBUTEX ) 8 MG SUBL SL tablet Place 8  mg under the tongue 3 (three) times daily.   Cholecalciferol  (VITAMIN D3) 50 MCG (2000 UT) TABS Take 2,000 mg by mouth daily.   Cyanocobalamin  2500 MCG CHEW Chew 2,500 mcg by mouth daily.   esomeprazole  (NEXIUM ) 40 MG capsule TAKE 1 CAPSULE(S) BY MOUTH 1 TIMES PER DAY *NEW PRESCRIPTION REQUEST*   fluticasone  (FLONASE ) 50 MCG/ACT nasal spray Place 1 spray into both nostrils at bedtime.   Fluticasone -Salmeterol (ADVAIR) 250-50 MCG/DOSE AEPB Inhale 1 puff into the lungs 2 (two) times daily.   gabapentin  (NEURONTIN ) 800 MG tablet Take 1 tablet (800 mg total) by mouth 3 (three) times daily.   isosorbide  mononitrate (IMDUR ) 30 MG 24 hr tablet TAKE 1 TABLET(S) BY MOUTH 1 TIMES PER DAY *NEW PRESCRIPTION REQUEST*   levETIRAcetam  (KEPPRA ) 500 MG tablet TAKE 1 TABLET(S) BY MOUTH 2 TIMES PER DAY *NEW PRESCRIPTION REQUEST*   lisinopril  (ZESTRIL ) 20 MG tablet TAKE 1 TABLET(S) BY MOUTH 1 TIMES PER DAY *NEW PRESCRIPTION REQUEST*   metFORMIN  (GLUCOPHAGE ) 500 MG tablet TAKE 1 TABLET(S) BY MOUTH 2 TIMES PER DAY *NEW PRESCRIPTION REQUEST*   metoprolol  succinate (TOPROL -XL) 50 MG 24 hr tablet TAKE 0.5 TABLET BY MOUTH 1 TIMES PER DAY *NEW PRESCRIPTION REQUEST*   ondansetron  (ZOFRAN -ODT) 4 MG disintegrating tablet Take 4 mg by mouth every 6 (six) hours as needed.   rosuvastatin  (CRESTOR ) 20 MG tablet TAKE 1 TABLET BY MOUTH ONCE EVERY EVENING   SPIRIVA  HANDIHALER 18 MCG inhalation capsule Place 18 mcg into inhaler and inhale daily.   spironolactone  (ALDACTONE ) 25 MG tablet TAKE 1/2 TABLET BY MOUTH EVERY DAY   tiZANidine  (ZANAFLEX ) 4 MG tablet TAKE 1 TABLET(S) BY MOUTH 4 TIMES PER DAY (EVERY 6 HOURS) AS NEEDED AS DIRECTED. *NEW PRESCRIPTION REQUEST*   torsemide  (DEMADEX ) 20 MG tablet TAKE 1 TABLET(S) BY MOUTH 2 TIMES PER DAY *NEW PRESCRIPTION REQUEST*   Vitamin E  400 units TABS Take 400 Units by mouth daily.   magnesium  oxide (MAG-OX) 400 (240 Mg) MG tablet Take 1 tablet (400 mg total) by mouth 2 (two) times daily.  (Patient not taking: Reported on 09/23/2023)   methylPREDNISolone  (MEDROL  DOSEPAK) 4 MG TBPK tablet Use as directed (Patient not taking: Reported on 09/23/2023)   Facility-Administered Medications Prior to Visit  Medication Dose Route Frequency Provider   denosumab  (PROLIA ) injection 60 mg  60 mg Subcutaneous Q6 months Fernand Fredy RAMAN, MD    Review of Systems  Constitutional: Negative.   HENT: Negative.    Eyes: Negative.   Respiratory: Negative.  Negative for cough and shortness of breath.   Cardiovascular: Negative.  Negative for chest pain, palpitations and leg  swelling.  Gastrointestinal: Negative.  Negative for abdominal pain, blood in stool, constipation, diarrhea, heartburn, melena, nausea and vomiting.  Genitourinary: Negative.  Negative for dysuria and flank pain.  Musculoskeletal:  Positive for joint pain and myalgias (L shoulder, arm, BLE).  Skin: Negative.   Neurological: Negative.  Negative for dizziness and headaches.  Endo/Heme/Allergies: Negative.   Psychiatric/Behavioral: Negative.  Negative for depression and suicidal ideas. The patient is not nervous/anxious.        Objective:   BP (!) 152/90   Pulse 69   Ht 5' 3 (1.6 m)   Wt 165 lb 9.6 oz (75.1 kg)   SpO2 96%   BMI 29.33 kg/m   Vitals:   09/23/23 1117  BP: (!) 152/90  Pulse: 69  Height: 5' 3 (1.6 m)  Weight: 165 lb 9.6 oz (75.1 kg)  SpO2: 96%  BMI (Calculated): 29.34    Physical Exam Vitals and nursing note reviewed.  Constitutional:      Appearance: Normal appearance.  HENT:     Head: Normocephalic and atraumatic.     Nose: Nose normal.     Mouth/Throat:     Mouth: Mucous membranes are moist.     Pharynx: Oropharynx is clear.  Eyes:     Conjunctiva/sclera: Conjunctivae normal.     Pupils: Pupils are equal, round, and reactive to light.  Cardiovascular:     Rate and Rhythm: Normal rate and regular rhythm.     Pulses: Normal pulses.     Heart sounds: Normal heart sounds. No murmur  heard. Pulmonary:     Effort: Pulmonary effort is normal.     Breath sounds: Normal breath sounds. No wheezing.  Abdominal:     General: Bowel sounds are normal.     Palpations: Abdomen is soft.     Tenderness: There is no abdominal tenderness. There is no right CVA tenderness or left CVA tenderness.  Musculoskeletal:        General: Normal range of motion.     Cervical back: Normal range of motion.     Right lower leg: No edema.     Left lower leg: No edema.  Skin:    General: Skin is warm and dry.  Neurological:     General: No focal deficit present.     Mental Status: She is alert and oriented to person, place, and time.  Psychiatric:        Mood and Affect: Mood normal.        Behavior: Behavior normal.      Results for orders placed or performed in visit on 09/23/23  POCT CBG (Fasting - Glucose)  Result Value Ref Range   Glucose Fasting, POC 118 (A) 70 - 99 mg/dL    Recent Results (from the past 2160 hours)  POCT CBG (Fasting - Glucose)     Status: Abnormal   Collection Time: 09/23/23 11:34 AM  Result Value Ref Range   Glucose Fasting, POC 118 (A) 70 - 99 mg/dL      Assessment & Plan:  Continue medications as prescribed. Referral to Psychiatrist and Pain Clinic sent. Low dose chest CT ordered for lung cancer screening. Routine labs ordered plus CPK, Sed Rate, and arthritis panel; will follow up with patient of results. Start Celebrex  200 mg once daily. Start Amlodipine  5 mg once daily. Monitor blood pressure at home. FU with Cardiology as scheduled. Problem List Items Addressed This Visit     Hypertension associated with diabetes (HCC)   Relevant Medications  amLODipine  (NORVASC ) 5 MG tablet   Other Relevant Orders   CBC with Diff   CMP14+EGFR   Depression with anxiety   Relevant Orders   Ambulatory referral to Psychology   Current smoker   Relevant Orders   CT CHEST LUNG CANCER SCREENING LOW DOSE WO CONTRAST   Type 2 diabetes mellitus with vascular  disease (HCC) - Primary   Relevant Medications   amLODipine  (NORVASC ) 5 MG tablet   Other Relevant Orders   POCT CBG (Fasting - Glucose) (Completed)   Hemoglobin A1c   Combined hyperlipidemia associated with type 2 diabetes mellitus (HCC)   Relevant Medications   amLODipine  (NORVASC ) 5 MG tablet   Other Relevant Orders   Lipid Panel w/o Chol/HDL Ratio   Chronic left shoulder pain   Relevant Medications   celecoxib  (CELEBREX ) 200 MG capsule   Other Relevant Orders   CK, total   Sed Rate (ESR)   Ambulatory referral to Pain Clinic   Arthralgia of multiple joints   Relevant Medications   celecoxib  (CELEBREX ) 200 MG capsule   Other Relevant Orders   Arthritis Panel   Ambulatory referral to Pain Clinic    Return in about 3 months (around 12/23/2023).   Total time spent: 30 minutes  FERNAND FREDY RAMAN, MD  09/23/2023   This document may have been prepared by [facility] Voice Recognition software and as such may include unintentional dictation errors.

## 2023-09-24 LAB — CMP14+EGFR
ALT: 9 IU/L (ref 0–32)
AST: 12 IU/L (ref 0–40)
Albumin: 3.8 g/dL — ABNORMAL LOW (ref 3.9–4.9)
Alkaline Phosphatase: 82 IU/L (ref 44–121)
BUN/Creatinine Ratio: 15 (ref 12–28)
BUN: 13 mg/dL (ref 8–27)
Bilirubin Total: 0.3 mg/dL (ref 0.0–1.2)
CO2: 24 mmol/L (ref 20–29)
Calcium: 9 mg/dL (ref 8.7–10.3)
Chloride: 104 mmol/L (ref 96–106)
Creatinine, Ser: 0.85 mg/dL (ref 0.57–1.00)
Globulin, Total: 2.2 g/dL (ref 1.5–4.5)
Glucose: 103 mg/dL — ABNORMAL HIGH (ref 70–99)
Potassium: 4 mmol/L (ref 3.5–5.2)
Sodium: 142 mmol/L (ref 134–144)
Total Protein: 6 g/dL (ref 6.0–8.5)
eGFR: 74 mL/min/1.73 (ref >59)

## 2023-09-24 LAB — HEMOGLOBIN A1C
Est. average glucose Bld gHb Est-mCnc: 131 mg/dL
Hgb A1c MFr Bld: 6.2 % — ABNORMAL HIGH (ref 4.8–5.6)

## 2023-09-24 LAB — CBC WITH DIFFERENTIAL/PLATELET
Basophils Absolute: 0.1 x10E3/uL (ref 0.0–0.2)
Basos: 1 %
EOS (ABSOLUTE): 0.2 x10E3/uL (ref 0.0–0.4)
Eos: 2 %
Hematocrit: 36.5 % (ref 34.0–46.6)
Hemoglobin: 11.6 g/dL (ref 11.1–15.9)
Immature Grans (Abs): 0 x10E3/uL (ref 0.0–0.1)
Immature Granulocytes: 0 %
Lymphocytes Absolute: 2 x10E3/uL (ref 0.7–3.1)
Lymphs: 24 %
MCH: 31.4 pg (ref 26.6–33.0)
MCHC: 31.8 g/dL (ref 31.5–35.7)
MCV: 99 fL — ABNORMAL HIGH (ref 79–97)
Monocytes Absolute: 0.5 x10E3/uL (ref 0.1–0.9)
Monocytes: 6 %
Neutrophils Absolute: 5.7 x10E3/uL (ref 1.4–7.0)
Neutrophils: 67 %
Platelets: 201 x10E3/uL (ref 150–450)
RBC: 3.69 x10E6/uL — ABNORMAL LOW (ref 3.77–5.28)
RDW: 13.2 % (ref 11.7–15.4)
WBC: 8.4 x10E3/uL (ref 3.4–10.8)

## 2023-09-24 LAB — CK: Total CK: 44 U/L (ref 32–182)

## 2023-09-24 LAB — ARTHRITIS PANEL
Anti Nuclear Antibody (ANA): NEGATIVE
Rheumatoid fact SerPl-aCnc: <10 [IU]/mL (ref <14.0)
Sed Rate: 9 mm/h (ref 0–40)
Uric Acid: 3.4 mg/dL (ref 3.0–7.2)

## 2023-09-24 LAB — LIPID PANEL W/O CHOL/HDL RATIO
Cholesterol, Total: 110 mg/dL (ref 100–199)
HDL: 55 mg/dL (ref >39)
LDL Chol Calc (NIH): 39 mg/dL (ref 0–99)
Triglycerides: 83 mg/dL (ref 0–149)
VLDL Cholesterol Cal: 16 mg/dL (ref 5–40)

## 2023-09-26 ENCOUNTER — Ambulatory Visit: Admitting: Cardiovascular Disease

## 2023-09-26 ENCOUNTER — Encounter: Payer: Self-pay | Admitting: Cardiovascular Disease

## 2023-09-26 VITALS — BP 142/80 | HR 95 | Ht 63.0 in | Wt 170.0 lb

## 2023-09-26 DIAGNOSIS — E1169 Type 2 diabetes mellitus with other specified complication: Secondary | ICD-10-CM

## 2023-09-26 DIAGNOSIS — F17200 Nicotine dependence, unspecified, uncomplicated: Secondary | ICD-10-CM

## 2023-09-26 DIAGNOSIS — R0602 Shortness of breath: Secondary | ICD-10-CM | POA: Diagnosis not present

## 2023-09-26 DIAGNOSIS — I152 Hypertension secondary to endocrine disorders: Secondary | ICD-10-CM | POA: Diagnosis not present

## 2023-09-26 DIAGNOSIS — E1159 Type 2 diabetes mellitus with other circulatory complications: Secondary | ICD-10-CM

## 2023-09-26 DIAGNOSIS — I5040 Unspecified combined systolic (congestive) and diastolic (congestive) heart failure: Secondary | ICD-10-CM

## 2023-09-26 DIAGNOSIS — R0789 Other chest pain: Secondary | ICD-10-CM | POA: Diagnosis not present

## 2023-09-26 DIAGNOSIS — E782 Mixed hyperlipidemia: Secondary | ICD-10-CM

## 2023-09-26 MED ORDER — SPIRONOLACTONE 25 MG PO TABS: 25.0000 mg | ORAL_TABLET | Freq: Every day | ORAL | 11 refills | Status: AC

## 2023-09-26 MED ORDER — TORSEMIDE 20 MG PO TABS: 20.0000 mg | ORAL_TABLET | Freq: Two times a day (BID) | ORAL | 11 refills | Status: AC

## 2023-09-26 MED ORDER — DAPAGLIFLOZIN PROPANEDIOL 10 MG PO TABS: 10.0000 mg | ORAL_TABLET | Freq: Every day | ORAL | 3 refills | Status: DC

## 2023-09-26 NOTE — Progress Notes (Signed)
 Cardiology Office Note   Date:  09/26/2023   ID:  Angela Daniel, Angela Daniel [birth year], MRN [id number]  PCP:  Fernand Fredy RAMAN, MD  Cardiologist:  Denyse Fernand, MD      History of Present Illness: Angela Daniel is a 68 y.o. female who presents for  Chief Complaint  Patient presents with   Follow-up    NST/echo results    Has gained weight and is very SOB, and chest pains.      Past Medical History:  Diagnosis Date   Congestive heart failure (HCC)    COPD (chronic obstructive pulmonary disease) (HCC)    Degenerative joint disease    Diabetes mellitus without complication (HCC)    Fibromyalgia    GERD (gastroesophageal reflux disease)    Hypertension    Scoliosis    Sleep apnea      Past Surgical History:  Procedure Laterality Date   ABDOMINAL HYSTERECTOMY     APPENDECTOMY     CESAREAN SECTION     x3   CHOLECYSTECTOMY     HIP SURGERY Left    joint replacement   KYPHOPLASTY N/A 05/18/2019   Procedure: T6 KYPHOPLASTY;  Surgeon: Kathlynn Sharper, MD;  Location: [facility] ORS;  Service: Orthopedics;  Laterality: N/A;     Current Outpatient Medications  Medication Sig Dispense Refill   celecoxib  (CELEBREX ) 200 MG capsule Take 1 capsule (200 mg total) by mouth daily. 30 capsule 2   dapagliflozin  propanediol (FARXIGA ) 10 MG TABS tablet Take 1 tablet (10 mg total) by mouth daily before breakfast. 30 tablet 3   spironolactone  (ALDACTONE ) 25 MG tablet Take 1 tablet (25 mg total) by mouth daily. 30 tablet 11   albuterol  (VENTOLIN  HFA) 108 (90 Base) MCG/ACT inhaler INHALE 1-2 PUFFS BY MOUTH EVERY 4-6 HOURS AS NEEDED AS DIRECTED *NEW PRESCRIPTION REQUEST* 8.5 g 11   alprazolam  (XANAX ) 2 MG tablet Take 2 mg by mouth 3 (three) times daily.     amphetamine -dextroamphetamine  (ADDERALL) 30 MG tablet Take 1 tablet by mouth 2 (two) times daily.     ARIPiprazole  (ABILIFY ) 15 MG tablet Take 7.5 mg by mouth daily.     aspirin  EC 81 MG tablet Take 81 mg by mouth daily.     blood glucose meter  kit and supplies KIT Dispense based on patient and insurance preference. Use up to four times daily as directed. Check blood sugar three times a day 1 each 0   buprenorphine  (SUBUTEX ) 8 MG SUBL SL tablet Place 8 mg under the tongue 3 (three) times daily.     Cholecalciferol  (VITAMIN D3) 50 MCG (2000 UT) TABS Take 2,000 mg by mouth daily.     Cyanocobalamin  2500 MCG CHEW Chew 2,500 mcg by mouth daily.     esomeprazole  (NEXIUM ) 40 MG capsule TAKE 1 CAPSULE(S) BY MOUTH 1 TIMES PER DAY *NEW PRESCRIPTION REQUEST* 90 capsule 11   fluticasone  (FLONASE ) 50 MCG/ACT nasal spray Place 1 spray into both nostrils at bedtime.     Fluticasone -Salmeterol (ADVAIR) 250-50 MCG/DOSE AEPB Inhale 1 puff into the lungs 2 (two) times daily.     gabapentin  (NEURONTIN ) 800 MG tablet Take 1 tablet (800 mg total) by mouth 3 (three) times daily. 270 tablet 1   isosorbide  mononitrate (IMDUR ) 30 MG 24 hr tablet TAKE 1 TABLET(S) BY MOUTH 1 TIMES PER DAY *NEW PRESCRIPTION REQUEST* 90 tablet 11   levETIRAcetam  (KEPPRA ) 500 MG tablet TAKE 1 TABLET(S) BY MOUTH 2 TIMES PER DAY *NEW PRESCRIPTION REQUEST* 180 tablet  11   lisinopril  (ZESTRIL ) 20 MG tablet TAKE 1 TABLET(S) BY MOUTH 1 TIMES PER DAY *NEW PRESCRIPTION REQUEST* 90 tablet 11   magnesium  oxide (MAG-OX) 400 (240 Mg) MG tablet Take 1 tablet (400 mg total) by mouth 2 (two) times daily. (Patient not taking: Reported on 09/23/2023) 30 tablet 0   metFORMIN  (GLUCOPHAGE ) 500 MG tablet TAKE 1 TABLET(S) BY MOUTH 2 TIMES PER DAY *NEW PRESCRIPTION REQUEST* 180 tablet 11   metoprolol  succinate (TOPROL -XL) 50 MG 24 hr tablet TAKE 0.5 TABLET BY MOUTH 1 TIMES PER DAY *NEW PRESCRIPTION REQUEST* 45 tablet 11   ondansetron  (ZOFRAN -ODT) 4 MG disintegrating tablet Take 4 mg by mouth every 6 (six) hours as needed.     rosuvastatin  (CRESTOR ) 20 MG tablet TAKE 1 TABLET BY MOUTH ONCE EVERY EVENING 90 tablet 0   SPIRIVA  HANDIHALER 18 MCG inhalation capsule Place 18 mcg into inhaler and inhale daily.      tiZANidine  (ZANAFLEX ) 4 MG tablet TAKE 1 TABLET(S) BY MOUTH 4 TIMES PER DAY (EVERY 6 HOURS) AS NEEDED AS DIRECTED. *NEW PRESCRIPTION REQUEST* 360 tablet 11   torsemide  (DEMADEX ) 20 MG tablet Take 1 tablet (20 mg total) by mouth 2 (two) times daily. 180 tablet 11   Vitamin E  400 units TABS Take 400 Units by mouth daily.     Current Facility-Administered Medications  Medication Dose Route Frequency Provider Last Rate Last Admin   denosumab  (PROLIA ) injection 60 mg  60 mg Subcutaneous Q6 months Fernand Fredy RAMAN, MD   60 mg at 09/15/23 1254    Allergies:   Tramadol, Tylenol  [acetaminophen ], and Vicodin [hydrocodone -acetaminophen ]    Social History:   reports that she has been smoking cigarettes. She has never used smokeless tobacco. She reports that she does not drink alcohol and does not use drugs.   Family History:  family history includes Asthma in her father; CVA in her mother; Diabetes in her mother; Heart attack in her father; Hypertension in her father; Lung cancer in her father and mother.    ROS:     Review of Systems  Constitutional: Negative.   HENT: Negative.    Eyes: Negative.   Respiratory: Negative.    Gastrointestinal: Negative.   Genitourinary: Negative.   Musculoskeletal: Negative.   Skin: Negative.   Neurological: Negative.   Endo/Heme/Allergies: Negative.   Psychiatric/Behavioral: Negative.    All other systems reviewed and are negative.     All other systems are reviewed and negative.    PHYSICAL EXAM: VS:  BP (!) 142/80   Pulse 95   Ht 5' 3 (1.6 m)   Wt 170 lb (77.1 kg)   SpO2 96%   BMI 30.11 kg/m  , BMI Body mass index is 30.11 kg/m. Last weight:  Wt Readings from Last 3 Encounters:  09/26/23 170 lb (77.1 kg)  09/23/23 165 lb 9.6 oz (75.1 kg)  09/12/23 165 lb 3.2 oz (74.9 kg)     Physical Exam Constitutional:      Appearance: Normal appearance.  Cardiovascular:     Rate and Rhythm: Normal rate and regular rhythm.     Heart sounds: Normal  heart sounds.  Pulmonary:     Effort: Pulmonary effort is normal.     Breath sounds: Normal breath sounds.  Musculoskeletal:     Right lower leg: No edema.     Left lower leg: No edema.  Neurological:     Mental Status: She is alert.       EKG:   Recent Labs:  09/23/2023: ALT 9; BUN 13; Creatinine, Ser 0.85; Hemoglobin 11.6; Platelets 201; Potassium 4.0; Sodium 142    Lipid Panel    Component Value Date/Time   CHOL 110 09/23/2023 1212   TRIG 83 09/23/2023 1212   HDL 55 09/23/2023 1212   LDLCALC 39 09/23/2023 1212      Other studies Reviewed: Additional studies/ records that were reviewed today include:  Review of the above records demonstrates:       No data to display            ASSESSMENT AND PLAN:    ICD-10-CM   1. Combined systolic and diastolic congestive heart failure, unspecified HF chronicity (HCC)  I50.40 dapagliflozin  propanediol (FARXIGA ) 10 MG TABS tablet    spironolactone  (ALDACTONE ) 25 MG tablet    torsemide  (DEMADEX ) 20 MG tablet   Has no ischaemia, grade 1 diastolic dysfunction, thus change aldactone  to 25 daily and add farxiag and stop amlodapine.    2. Hypertension associated with diabetes (HCC)  E11.59 dapagliflozin  propanediol (FARXIGA ) 10 MG TABS tablet   I15.2 spironolactone  (ALDACTONE ) 25 MG tablet    torsemide  (DEMADEX ) 20 MG tablet    3. Combined hyperlipidemia associated with type 2 diabetes mellitus (HCC)  E11.69 dapagliflozin  propanediol (FARXIGA ) 10 MG TABS tablet   E78.2 spironolactone  (ALDACTONE ) 25 MG tablet    torsemide  (DEMADEX ) 20 MG tablet    4. SOB (shortness of breath)  R06.02 dapagliflozin  propanediol (FARXIGA ) 10 MG TABS tablet    spironolactone  (ALDACTONE ) 25 MG tablet    torsemide  (DEMADEX ) 20 MG tablet    5. Current smoker  F17.200 dapagliflozin  propanediol (FARXIGA ) 10 MG TABS tablet    spironolactone  (ALDACTONE ) 25 MG tablet    torsemide  (DEMADEX ) 20 MG tablet    6. Other chest pain  R07.89 dapagliflozin   propanediol (FARXIGA ) 10 MG TABS tablet    spironolactone  (ALDACTONE ) 25 MG tablet    torsemide  (DEMADEX ) 20 MG tablet       Problem List Items Addressed This Visit       Cardiovascular and Mediastinum   Hypertension associated with diabetes (HCC)   Relevant Medications   dapagliflozin  propanediol (FARXIGA ) 10 MG TABS tablet   spironolactone  (ALDACTONE ) 25 MG tablet   torsemide  (DEMADEX ) 20 MG tablet   Congestive heart failure (HCC) - Primary   Relevant Medications   dapagliflozin  propanediol (FARXIGA ) 10 MG TABS tablet   spironolactone  (ALDACTONE ) 25 MG tablet   torsemide  (DEMADEX ) 20 MG tablet     Endocrine   Combined hyperlipidemia associated with type 2 diabetes mellitus (HCC)   Relevant Medications   dapagliflozin  propanediol (FARXIGA ) 10 MG TABS tablet   spironolactone  (ALDACTONE ) 25 MG tablet   torsemide  (DEMADEX ) 20 MG tablet     Other   Current smoker   Relevant Medications   dapagliflozin  propanediol (FARXIGA ) 10 MG TABS tablet   spironolactone  (ALDACTONE ) 25 MG tablet   torsemide  (DEMADEX ) 20 MG tablet   SOB (shortness of breath)   Relevant Medications   dapagliflozin  propanediol (FARXIGA ) 10 MG TABS tablet   spironolactone  (ALDACTONE ) 25 MG tablet   torsemide  (DEMADEX ) 20 MG tablet   Other Visit Diagnoses       Other chest pain       Relevant Medications   dapagliflozin  propanediol (FARXIGA ) 10 MG TABS tablet   spironolactone  (ALDACTONE ) 25 MG tablet   torsemide  (DEMADEX ) 20 MG tablet          Disposition:   Return in about 4 weeks (around 10/24/2023).  Total time spent: 30 minutes  Signed,  Denyse Bathe, MD  09/26/2023 11:38 AM    [facility]

## 2023-09-30 NOTE — Progress Notes (Signed)
Pt informed

## 2023-10-02 ENCOUNTER — Other Ambulatory Visit (INDEPENDENT_AMBULATORY_CARE_PROVIDER_SITE_OTHER): Admitting: Internal Medicine

## 2023-10-02 ENCOUNTER — Ambulatory Visit: Payer: Self-pay | Admitting: Internal Medicine

## 2023-10-02 DIAGNOSIS — D649 Anemia, unspecified: Secondary | ICD-10-CM

## 2023-10-02 DIAGNOSIS — R195 Other fecal abnormalities: Secondary | ICD-10-CM

## 2023-10-02 LAB — POC HEMOCCULT BLD/STL (OFFICE/1-CARD/DIAGNOSTIC)
Card #2 Fecal Occult Blod, POC: NEGATIVE
Card #3 Fecal Occult Blood, POC: POSITIVE
Fecal Occult Blood, POC: NEGATIVE

## 2023-10-06 ENCOUNTER — Telehealth: Payer: Self-pay

## 2023-10-06 NOTE — Telephone Encounter (Signed)
 Pt LM asking for call back about her stool sample results

## 2023-10-07 NOTE — Progress Notes (Signed)
 Patient notified

## 2023-10-10 ENCOUNTER — Encounter: Payer: Self-pay | Admitting: Internal Medicine

## 2023-10-14 ENCOUNTER — Telehealth: Payer: Self-pay

## 2023-10-14 NOTE — Telephone Encounter (Signed)
 Pt called with concern regarding bp being low, readings from this morning are: 98/60, 112/60, and 118/59. Feeling a little fatigue but no shortness of breath or chest pain. Please advise

## 2023-10-24 ENCOUNTER — Encounter: Payer: Self-pay | Admitting: Cardiovascular Disease

## 2023-10-24 ENCOUNTER — Ambulatory Visit (INDEPENDENT_AMBULATORY_CARE_PROVIDER_SITE_OTHER): Admitting: Cardiovascular Disease

## 2023-10-24 VITALS — BP 118/74 | HR 97 | Ht 63.0 in | Wt 167.2 lb

## 2023-10-24 DIAGNOSIS — I5040 Unspecified combined systolic (congestive) and diastolic (congestive) heart failure: Secondary | ICD-10-CM | POA: Diagnosis not present

## 2023-10-24 DIAGNOSIS — J449 Chronic obstructive pulmonary disease, unspecified: Secondary | ICD-10-CM

## 2023-10-24 DIAGNOSIS — E1169 Type 2 diabetes mellitus with other specified complication: Secondary | ICD-10-CM

## 2023-10-24 DIAGNOSIS — F17210 Nicotine dependence, cigarettes, uncomplicated: Secondary | ICD-10-CM | POA: Diagnosis not present

## 2023-10-24 DIAGNOSIS — I152 Hypertension secondary to endocrine disorders: Secondary | ICD-10-CM

## 2023-10-24 DIAGNOSIS — R0602 Shortness of breath: Secondary | ICD-10-CM

## 2023-10-24 DIAGNOSIS — E782 Mixed hyperlipidemia: Secondary | ICD-10-CM | POA: Diagnosis not present

## 2023-10-24 DIAGNOSIS — E1159 Type 2 diabetes mellitus with other circulatory complications: Secondary | ICD-10-CM

## 2023-10-24 DIAGNOSIS — F17200 Nicotine dependence, unspecified, uncomplicated: Secondary | ICD-10-CM

## 2023-10-24 DIAGNOSIS — R0789 Other chest pain: Secondary | ICD-10-CM

## 2023-10-24 MED ORDER — DAPAGLIFLOZIN PROPANEDIOL 10 MG PO TABS: 10.0000 mg | ORAL_TABLET | Freq: Every day | ORAL | 3 refills | Status: AC

## 2023-10-24 MED ORDER — NICOTINE 21 MG/24HR TD PT24: 21.0000 mg | MEDICATED_PATCH | TRANSDERMAL | 1 refills | Status: AC

## 2023-10-24 NOTE — Progress Notes (Signed)
 Cardiology Office Note   Date:  10/24/2023   ID:  [name], Loud 07-07-1955, MRN [id number]  PCP:  Fernand Fredy RAMAN, MD  Cardiologist:  Denyse Fernand, MD      History of Present Illness: Angela Daniel is a 68 y.o. female who presents for  Chief Complaint  Patient presents with   Follow-up    4 week follow up    Has cramp in left side of chest for few months, SOB and feels weak just walking to mail box.      Past Medical History:  Diagnosis Date   Congestive heart failure (HCC)    COPD (chronic obstructive pulmonary disease) (HCC)    Degenerative joint disease    Diabetes mellitus without complication (HCC)    Fibromyalgia    GERD (gastroesophageal reflux disease)    Hypertension    Scoliosis    Sleep apnea      Past Surgical History:  Procedure Laterality Date   ABDOMINAL HYSTERECTOMY     APPENDECTOMY     CESAREAN SECTION     x3   CHOLECYSTECTOMY     HIP SURGERY Left    joint replacement   KYPHOPLASTY N/A 05/18/2019   Procedure: T6 KYPHOPLASTY;  Surgeon: Kathlynn Sharper, MD;  Location: [facility] ORS;  Service: Orthopedics;  Laterality: N/A;     Current Outpatient Medications  Medication Sig Dispense Refill   albuterol  (VENTOLIN  HFA) 108 (90 Base) MCG/ACT inhaler INHALE 1-2 PUFFS BY MOUTH EVERY 4-6 HOURS AS NEEDED AS DIRECTED *NEW PRESCRIPTION REQUEST* 8.5 g 11   alprazolam  (XANAX ) 2 MG tablet Take 2 mg by mouth 3 (three) times daily.     amphetamine -dextroamphetamine  (ADDERALL) 30 MG tablet Take 1 tablet by mouth 2 (two) times daily.     ARIPiprazole  (ABILIFY ) 15 MG tablet Take 7.5 mg by mouth daily.     aspirin  EC 81 MG tablet Take 81 mg by mouth daily.     buprenorphine  (SUBUTEX ) 8 MG SUBL SL tablet Place 8 mg under the tongue 3 (three) times daily.     celecoxib  (CELEBREX ) 200 MG capsule Take 1 capsule (200 mg total) by mouth daily. 30 capsule 2   Cholecalciferol  (VITAMIN D3) 50 MCG (2000 UT) TABS Take 2,000 mg by mouth daily.     Cyanocobalamin  2500  MCG CHEW Chew 2,500 mcg by mouth daily.     esomeprazole  (NEXIUM ) 40 MG capsule TAKE 1 CAPSULE(S) BY MOUTH 1 TIMES PER DAY *NEW PRESCRIPTION REQUEST* 90 capsule 11   fluticasone  (FLONASE ) 50 MCG/ACT nasal spray Place 1 spray into both nostrils at bedtime.     Fluticasone -Salmeterol (ADVAIR) 250-50 MCG/DOSE AEPB Inhale 1 puff into the lungs 2 (two) times daily.     gabapentin  (NEURONTIN ) 800 MG tablet Take 1 tablet (800 mg total) by mouth 3 (three) times daily. 270 tablet 1   isosorbide  mononitrate (IMDUR ) 30 MG 24 hr tablet TAKE 1 TABLET(S) BY MOUTH 1 TIMES PER DAY *NEW PRESCRIPTION REQUEST* 90 tablet 11   levETIRAcetam  (KEPPRA ) 500 MG tablet TAKE 1 TABLET(S) BY MOUTH 2 TIMES PER DAY *NEW PRESCRIPTION REQUEST* 180 tablet 11   lisinopril  (ZESTRIL ) 20 MG tablet TAKE 1 TABLET(S) BY MOUTH 1 TIMES PER DAY *NEW PRESCRIPTION REQUEST* 90 tablet 11   metFORMIN  (GLUCOPHAGE ) 500 MG tablet TAKE 1 TABLET(S) BY MOUTH 2 TIMES PER DAY *NEW PRESCRIPTION REQUEST* 180 tablet 11   metoprolol  succinate (TOPROL -XL) 50 MG 24 hr tablet TAKE 0.5 TABLET BY MOUTH 1 TIMES PER DAY *NEW  PRESCRIPTION REQUEST* 45 tablet 11   ondansetron  (ZOFRAN -ODT) 4 MG disintegrating tablet Take 4 mg by mouth every 6 (six) hours as needed.     rosuvastatin  (CRESTOR ) 20 MG tablet TAKE 1 TABLET BY MOUTH ONCE EVERY EVENING 90 tablet 0   SPIRIVA  HANDIHALER 18 MCG inhalation capsule Place 18 mcg into inhaler and inhale daily.     spironolactone  (ALDACTONE ) 25 MG tablet Take 1 tablet (25 mg total) by mouth daily. 30 tablet 11   tiZANidine  (ZANAFLEX ) 4 MG tablet TAKE 1 TABLET(S) BY MOUTH 4 TIMES PER DAY (EVERY 6 HOURS) AS NEEDED AS DIRECTED. *NEW PRESCRIPTION REQUEST* 360 tablet 11   torsemide  (DEMADEX ) 20 MG tablet Take 1 tablet (20 mg total) by mouth 2 (two) times daily. 180 tablet 11   Vitamin E  400 units TABS Take 400 Units by mouth daily.     blood glucose meter kit and supplies KIT Dispense based on patient and insurance preference. Use up to  four times daily as directed. Check blood sugar three times a day (Patient not taking: Reported on 10/24/2023) 1 each 0   dapagliflozin  propanediol (FARXIGA ) 10 MG TABS tablet Take 1 tablet (10 mg total) by mouth daily before breakfast. 30 tablet 3   Current Facility-Administered Medications  Medication Dose Route Frequency Provider Last Rate Last Admin   denosumab  (PROLIA ) injection 60 mg  60 mg Subcutaneous Q6 months Hoa Deriso, Neelam S, MD   60 mg at 09/15/23 1254    Allergies:   Tramadol, Tylenol  [acetaminophen ], and Vicodin [hydrocodone -acetaminophen ]    Social History:   reports that she has been smoking cigarettes. She has never used smokeless tobacco. She reports that she does not drink alcohol and does not use drugs.   Family History:  family history includes Asthma in her father; CVA in her mother; Diabetes in her mother; Heart attack in her father; Hypertension in her father; Lung cancer in her father and mother.    ROS:     Review of Systems  Constitutional: Negative.   HENT: Negative.    Eyes: Negative.   Respiratory: Negative.    Gastrointestinal: Negative.   Genitourinary: Negative.   Musculoskeletal: Negative.   Skin: Negative.   Neurological: Negative.   Endo/Heme/Allergies: Negative.   Psychiatric/Behavioral: Negative.    All other systems reviewed and are negative.     All other systems are reviewed and negative.    PHYSICAL EXAM: VS:  BP 118/74   Pulse 97   Ht 5' 3 (1.6 m)   Wt 167 lb 3.2 oz (75.8 kg)   SpO2 99%   BMI 29.62 kg/m  , BMI Body mass index is 29.62 kg/m. Last weight:  Wt Readings from Last 3 Encounters:  10/24/23 167 lb 3.2 oz (75.8 kg)  09/26/23 170 lb (77.1 kg)  09/23/23 165 lb 9.6 oz (75.1 kg)     Physical Exam Constitutional:      Appearance: Normal appearance.  Cardiovascular:     Rate and Rhythm: Normal rate and regular rhythm.     Heart sounds: Normal heart sounds.  Pulmonary:     Effort: Pulmonary effort is normal.      Breath sounds: Normal breath sounds.  Musculoskeletal:     Right lower leg: No edema.     Left lower leg: No edema.  Neurological:     Mental Status: She is alert.       EKG:   Recent Labs: 09/23/2023: ALT 9; BUN 13; Creatinine, Ser 0.85; Hemoglobin 11.6; Platelets 201; Potassium  4.0; Sodium 142    Lipid Panel    Component Value Date/Time   CHOL 110 09/23/2023 1212   TRIG 83 09/23/2023 1212   HDL 55 09/23/2023 1212   LDLCALC 39 09/23/2023 1212      Other studies Reviewed: Additional studies/ records that were reviewed today include:  Review of the above records demonstrates:       No data to display            ASSESSMENT AND PLAN:    ICD-10-CM   1. Other chest pain  R07.89 dapagliflozin  propanediol (FARXIGA ) 10 MG TABS tablet   stress test was normal, echo has diastolic dysfunction. Add farxiga .    2. Combined systolic and diastolic congestive heart failure, unspecified HF chronicity (HCC)  I50.40 dapagliflozin  propanediol (FARXIGA ) 10 MG TABS tablet    3. Hypertension associated with diabetes (HCC)  E11.59 dapagliflozin  propanediol (FARXIGA ) 10 MG TABS tablet   I15.2     4. Type 2 diabetes mellitus with vascular disease (HCC)  E11.59     5. Chronic obstructive pulmonary disease, unspecified COPD type (HCC)  J44.9     6. SOB (shortness of breath)  R06.02 dapagliflozin  propanediol (FARXIGA ) 10 MG TABS tablet    7. Mixed hyperlipidemia  E78.2     8. Combined systolic and diastolic congestive heart failure, unspecified HF chronicity (HCC)  I50.40 dapagliflozin  propanediol (FARXIGA ) 10 MG TABS tablet   Has no ischaemia, grade 1 diastolic dysfunction, thus change aldactone  to 25 daily and add farxiag and stop amlodapine.    9. Combined hyperlipidemia associated with type 2 diabetes mellitus (HCC)  E11.69 dapagliflozin  propanediol (FARXIGA ) 10 MG TABS tablet   E78.2     10. Current smoker  F17.200 dapagliflozin  propanediol (FARXIGA ) 10 MG TABS tablet    11.  Other chest pain  R07.89 dapagliflozin  propanediol (FARXIGA ) 10 MG TABS tablet       Problem List Items Addressed This Visit       Cardiovascular and Mediastinum   Hypertension associated with diabetes (HCC)   Relevant Medications   dapagliflozin  propanediol (FARXIGA ) 10 MG TABS tablet   Congestive heart failure (HCC)   Relevant Medications   dapagliflozin  propanediol (FARXIGA ) 10 MG TABS tablet   Type 2 diabetes mellitus with vascular disease (HCC)   Relevant Medications   dapagliflozin  propanediol (FARXIGA ) 10 MG TABS tablet     Respiratory   COPD (chronic obstructive pulmonary disease) (HCC)     Endocrine   Combined hyperlipidemia associated with type 2 diabetes mellitus (HCC)   Relevant Medications   dapagliflozin  propanediol (FARXIGA ) 10 MG TABS tablet     Other   HLD (hyperlipidemia)   Current smoker   Relevant Medications   dapagliflozin  propanediol (FARXIGA ) 10 MG TABS tablet   SOB (shortness of breath)   Relevant Medications   dapagliflozin  propanediol (FARXIGA ) 10 MG TABS tablet   Other Visit Diagnoses       Other chest pain    -  Primary   stress test was normal, echo has diastolic dysfunction. Add farxiga .   Relevant Medications   dapagliflozin  propanediol (FARXIGA ) 10 MG TABS tablet     Other chest pain       Relevant Medications   dapagliflozin  propanediol (FARXIGA ) 10 MG TABS tablet          Disposition:   Return in about 2 months (around 12/24/2023).    Total time spent: 35 minutes  Signed,  Denyse Bathe, MD  10/24/2023 11:22 AM  [facility]

## 2023-10-27 ENCOUNTER — Other Ambulatory Visit: Payer: Self-pay | Admitting: Internal Medicine

## 2023-10-27 ENCOUNTER — Other Ambulatory Visit: Payer: Self-pay | Admitting: Cardiology

## 2023-10-30 ENCOUNTER — Ambulatory Visit

## 2023-10-31 ENCOUNTER — Ambulatory Visit: Admission: RE | Admit: 2023-10-31 | Source: Ambulatory Visit

## 2023-10-31 DIAGNOSIS — Z79899 Other long term (current) drug therapy: Secondary | ICD-10-CM | POA: Diagnosis not present

## 2023-10-31 DIAGNOSIS — Z5181 Encounter for therapeutic drug level monitoring: Secondary | ICD-10-CM | POA: Diagnosis not present

## 2023-11-03 NOTE — Progress Notes (Addendum)
 Angela Daniel                                          MRN: [id number]   11/03/2023   The [facility] Quality Team Specialist reviewed this patient medical record for the purposes of chart review for care gap closure. The following were reviewed: chart review for care gap closure-kidney health evaluation for diabetes:eGFR  and uACR X2    [facility] Quality Team

## 2023-11-06 ENCOUNTER — Emergency Department

## 2023-11-06 ENCOUNTER — Emergency Department
Admission: EM | Admit: 2023-11-06 | Discharge: 2023-11-06 | Discharge status: Against Medical Advice | Attending: Emergency Medicine | Admitting: Emergency Medicine

## 2023-11-06 ENCOUNTER — Other Ambulatory Visit: Payer: Self-pay

## 2023-11-06 DIAGNOSIS — R059 Cough, unspecified: Secondary | ICD-10-CM | POA: Insufficient documentation

## 2023-11-06 DIAGNOSIS — R0981 Nasal congestion: Secondary | ICD-10-CM | POA: Diagnosis not present

## 2023-11-06 DIAGNOSIS — Z5321 Procedure and treatment not carried out due to patient leaving prior to being seen by health care provider: Secondary | ICD-10-CM | POA: Diagnosis not present

## 2023-11-06 DIAGNOSIS — R0989 Other specified symptoms and signs involving the circulatory and respiratory systems: Secondary | ICD-10-CM | POA: Diagnosis not present

## 2023-11-06 DIAGNOSIS — R058 Other specified cough: Secondary | ICD-10-CM | POA: Diagnosis not present

## 2023-11-06 LAB — RESP PANEL BY RT-PCR (RSV, FLU A&B, COVID)  RVPGX2
Influenza A by PCR: NEGATIVE
Influenza B by PCR: NEGATIVE
Resp Syncytial Virus by PCR: NEGATIVE
SARS Coronavirus 2 by RT PCR: NEGATIVE

## 2023-11-06 NOTE — ED Triage Notes (Signed)
 Patient states productive cough and congestion for 4-5 days; denies known sick contacts.

## 2023-11-07 ENCOUNTER — Telehealth: Payer: Self-pay | Admitting: Internal Medicine

## 2023-11-07 NOTE — Telephone Encounter (Signed)
 Patient called in stating she went to ER last night but left after being tested for resp panel and having an x-ray. She has a cough and cold symptoms and is wanting an antibiotic. She said she has had this cough for a while and feels like she has a bruised rib because it is painful when she coughs. States she is also having decreased appetite but would not elaborate on the cold symptoms.    [facility]

## 2023-11-10 ENCOUNTER — Telehealth: Payer: Self-pay | Admitting: Internal Medicine

## 2023-11-10 NOTE — Telephone Encounter (Signed)
 Pt called stating that she has been calling since last Friday and no one has called her back to notify her of what she could do for her cold symptoms Pt states this is very [employer] account planner and wanted me to relay the message through here since she hasn't been able to get through on the phone.   I am aware I am not supposed to send messages through telephone encounter, pt stated she wanted me to on her behalf. Thanks!

## 2023-11-17 ENCOUNTER — Ambulatory Visit (HOSPITAL_BASED_OUTPATIENT_CLINIC_OR_DEPARTMENT_OTHER): Admitting: Pain Medicine

## 2023-11-17 DIAGNOSIS — Z789 Other specified health status: Secondary | ICD-10-CM | POA: Insufficient documentation

## 2023-11-17 DIAGNOSIS — Z79899 Other long term (current) drug therapy: Secondary | ICD-10-CM | POA: Insufficient documentation

## 2023-11-17 DIAGNOSIS — Z91199 Patient's noncompliance with other medical treatment and regimen due to unspecified reason: Secondary | ICD-10-CM

## 2023-11-17 DIAGNOSIS — M899 Disorder of bone, unspecified: Secondary | ICD-10-CM | POA: Insufficient documentation

## 2023-11-17 DIAGNOSIS — G8929 Other chronic pain: Secondary | ICD-10-CM

## 2023-11-17 DIAGNOSIS — G894 Chronic pain syndrome: Secondary | ICD-10-CM | POA: Insufficient documentation

## 2023-11-17 NOTE — Progress Notes (Signed)
 Department: [facility]) Date: 11/17/2023  Event: Canceled/Rescheduled by patient.  Encounter Type: (NP) New patient evaluation. Initial evaluation visit. Advance notice: Less than 24 hr notice.  Reason: Sickness.          Note: Patient will R/S. See patient information (AVS) for Appointment policy.

## 2023-11-17 NOTE — Patient Instructions (Addendum)

## 2023-11-19 DIAGNOSIS — J449 Chronic obstructive pulmonary disease, unspecified: Secondary | ICD-10-CM | POA: Diagnosis not present

## 2023-12-08 ENCOUNTER — Ambulatory Visit (HOSPITAL_BASED_OUTPATIENT_CLINIC_OR_DEPARTMENT_OTHER): Admitting: Pain Medicine

## 2023-12-08 DIAGNOSIS — Z91199 Patient's noncompliance with other medical treatment and regimen due to unspecified reason: Secondary | ICD-10-CM

## 2023-12-08 DIAGNOSIS — G8929 Other chronic pain: Secondary | ICD-10-CM

## 2023-12-08 NOTE — Patient Instructions (Signed)
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
## 2023-12-13 ENCOUNTER — Other Ambulatory Visit: Payer: Self-pay | Admitting: Cardiovascular Disease

## 2023-12-22 ENCOUNTER — Other Ambulatory Visit: Payer: Self-pay | Admitting: Internal Medicine

## 2023-12-22 DIAGNOSIS — M2550 Pain in unspecified joint: Secondary | ICD-10-CM

## 2023-12-23 ENCOUNTER — Ambulatory Visit: Admitting: Internal Medicine

## 2023-12-26 ENCOUNTER — Ambulatory Visit: Admitting: Cardiovascular Disease

## 2023-12-26 ENCOUNTER — Encounter: Payer: Self-pay | Admitting: Internal Medicine

## 2024-01-02 ENCOUNTER — Ambulatory Visit: Admitting: Cardiovascular Disease

## 2024-01-30 ENCOUNTER — Other Ambulatory Visit: Payer: Self-pay | Admitting: Internal Medicine

## 2024-01-30 ENCOUNTER — Other Ambulatory Visit: Payer: Self-pay | Admitting: Cardiovascular Disease

## 2024-01-30 DIAGNOSIS — M62838 Other muscle spasm: Secondary | ICD-10-CM

## 2024-02-02 ENCOUNTER — Other Ambulatory Visit: Payer: Self-pay | Admitting: Internal Medicine

## 2024-02-02 MED ORDER — ALBUTEROL SULFATE HFA 108 (90 BASE) MCG/ACT IN AERS: 1.0000 | INHALATION_SPRAY | RESPIRATORY_TRACT | 11 refills | Status: DC | PRN

## 2024-02-13 ENCOUNTER — Telehealth: Payer: Self-pay | Admitting: Internal Medicine

## 2024-02-13 NOTE — Telephone Encounter (Signed)
 Patient called in stating that Dr. Fernand sent her in prednisone  a month or so ago for her lungs and congestion. She is requesting another round of prednisone  because she is having the same issue again.   [facility] [place]

## 2024-02-13 NOTE — Progress Notes (Signed)
 Angela Daniel                                          MRN: [id number]   02/13/2024   The [facility] Quality Team Specialist reviewed this patient medical record for the purposes of chart review for care gap closure. The following were reviewed: abstraction for care gap closure-glycemic status assessment.    [facility] Quality Team

## 2024-02-24 ENCOUNTER — Ambulatory Visit: Admitting: Cardiovascular Disease

## 2024-02-24 ENCOUNTER — Encounter: Payer: Self-pay | Admitting: Cardiovascular Disease

## 2024-02-24 VITALS — BP 140/76 | Ht 63.0 in | Wt 160.0 lb

## 2024-02-24 DIAGNOSIS — F17200 Nicotine dependence, unspecified, uncomplicated: Secondary | ICD-10-CM

## 2024-02-24 DIAGNOSIS — R0789 Other chest pain: Secondary | ICD-10-CM | POA: Diagnosis not present

## 2024-02-24 DIAGNOSIS — I5040 Unspecified combined systolic (congestive) and diastolic (congestive) heart failure: Secondary | ICD-10-CM

## 2024-02-24 DIAGNOSIS — E1159 Type 2 diabetes mellitus with other circulatory complications: Secondary | ICD-10-CM

## 2024-02-24 DIAGNOSIS — I152 Hypertension secondary to endocrine disorders: Secondary | ICD-10-CM

## 2024-02-24 DIAGNOSIS — J449 Chronic obstructive pulmonary disease, unspecified: Secondary | ICD-10-CM

## 2024-02-24 DIAGNOSIS — E1169 Type 2 diabetes mellitus with other specified complication: Secondary | ICD-10-CM

## 2024-02-24 DIAGNOSIS — R0602 Shortness of breath: Secondary | ICD-10-CM

## 2024-02-24 DIAGNOSIS — E782 Mixed hyperlipidemia: Secondary | ICD-10-CM | POA: Diagnosis not present

## 2024-02-24 NOTE — Progress Notes (Signed)
 "     Cardiology Office Note   Date:  02/24/2024   ID:  Amye, Grego 20-Feb-1955, MRN [id number]  PCP:  Fernand Fredy RAMAN, MD  Cardiologist:  Denyse Fernand, MD      History of Present Illness: Angela Daniel is a 69 y.o. female who presents for  Chief Complaint  Patient presents with   Follow-up    Follow up     Has SOB and wheezing, and occasional chest pain.      Past Medical History:  Diagnosis Date   Congestive heart failure (HCC)    COPD (chronic obstructive pulmonary disease) (HCC)    Degenerative joint disease    Diabetes mellitus without complication (HCC)    Fibromyalgia    GERD (gastroesophageal reflux disease)    Hypertension    Scoliosis    Sleep apnea      Past Surgical History:  Procedure Laterality Date   ABDOMINAL HYSTERECTOMY     APPENDECTOMY     CESAREAN SECTION     x3   CHOLECYSTECTOMY     HIP SURGERY Left    joint replacement   KYPHOPLASTY N/A 05/18/2019   Procedure: T6 KYPHOPLASTY;  Surgeon: Kathlynn Sharper, MD;  Location: [facility] ORS;  Service: Orthopedics;  Laterality: N/A;     Current Outpatient Medications  Medication Sig Dispense Refill   albuterol  (VENTOLIN  HFA) 108 (90 Base) MCG/ACT inhaler Inhale 1-2 puffs into the lungs every 4 (four) hours as needed for wheezing or shortness of breath. 8.5 g 11   alprazolam  (XANAX ) 2 MG tablet Take 2 mg by mouth 3 (three) times daily.     amphetamine -dextroamphetamine  (ADDERALL) 30 MG tablet Take 1 tablet by mouth 2 (two) times daily.     ARIPiprazole  (ABILIFY ) 15 MG tablet Take 7.5 mg by mouth daily.     aspirin  EC 81 MG tablet Take 81 mg by mouth daily.     buprenorphine  (SUBUTEX ) 8 MG SUBL SL tablet Place 8 mg under the tongue 3 (three) times daily.     celecoxib  (CELEBREX ) 200 MG capsule TAKE 1 CAPSULE BY MOUTH ONCE DAILY 30 capsule 2   Cholecalciferol  (VITAMIN D3) 50 MCG (2000 UT) TABS Take 2,000 mg by mouth daily.     Cyanocobalamin  2500 MCG CHEW Chew 2,500 mcg by mouth daily.      dapagliflozin  propanediol (FARXIGA ) 10 MG TABS tablet Take 1 tablet (10 mg total) by mouth daily before breakfast. 30 tablet 3   esomeprazole  (NEXIUM ) 40 MG capsule TAKE 1 CAPSULE BY MOUTH ONCE DAILY 90 capsule 3   fluticasone  (FLONASE ) 50 MCG/ACT nasal spray Place 1 spray into both nostrils at bedtime.     Fluticasone -Salmeterol (ADVAIR) 250-50 MCG/DOSE AEPB Inhale 1 puff into the lungs 2 (two) times daily.     gabapentin  (NEURONTIN ) 800 MG tablet Take 1 tablet (800 mg total) by mouth 3 (three) times daily. 270 tablet 1   isosorbide  mononitrate (IMDUR ) 30 MG 24 hr tablet TAKE 1 TABLET BY MOUTH ONCE DAILY 30 tablet 3   levETIRAcetam  (KEPPRA ) 500 MG tablet TAKE 1 TABLET(S) BY MOUTH 2 TIMES PER DAY *NEW PRESCRIPTION REQUEST* 180 tablet 11   lisinopril  (ZESTRIL ) 20 MG tablet TAKE 1 TABLET(S) BY MOUTH 1 TIMES PER DAY *NEW PRESCRIPTION REQUEST* 90 tablet 11   metFORMIN  (GLUCOPHAGE ) 500 MG tablet TAKE 1 TABLET(S) BY MOUTH 2 TIMES PER DAY *NEW PRESCRIPTION REQUEST* 180 tablet 11   metoprolol  succinate (TOPROL -XL) 50 MG 24 hr tablet TAKE 0.5 TABLET BY MOUTH  1 TIMES PER DAY *NEW PRESCRIPTION REQUEST* 45 tablet 11   nicotine  (NICODERM CQ  - DOSED IN MG/24 HOURS) 21 mg/24hr patch Place 1 patch (21 mg total) onto the skin daily. 30 patch 1   ondansetron  (ZOFRAN -ODT) 4 MG disintegrating tablet Take 4 mg by mouth every 6 (six) hours as needed.     rosuvastatin  (CRESTOR ) 20 MG tablet TAKE 1 TABLET BY MOUTH ONCE EVERY EVENING 90 tablet 0   SPIRIVA  HANDIHALER 18 MCG inhalation capsule Place 18 mcg into inhaler and inhale daily.     spironolactone  (ALDACTONE ) 25 MG tablet Take 1 tablet (25 mg total) by mouth daily. 30 tablet 11   tiZANidine  (ZANAFLEX ) 4 MG tablet TAKE 1 TABLET BY MOUTH EVERY 6 HOURS AS NEEDED 60 tablet 0   torsemide  (DEMADEX ) 20 MG tablet Take 1 tablet (20 mg total) by mouth 2 (two) times daily. 180 tablet 11   Vitamin E  400 units TABS Take 400 Units by mouth daily.     Current Facility-Administered  Medications  Medication Dose Route Frequency Provider Last Rate Last Admin   denosumab  (PROLIA ) injection 60 mg  60 mg Subcutaneous Q6 months Fernand Fredy RAMAN, MD   60 mg at 09/15/23 1254    Allergies:   Tramadol, Tylenol  [acetaminophen ], and Vicodin [hydrocodone -acetaminophen ]    Social History:   reports that she has been smoking cigarettes. She has never used smokeless tobacco. She reports that she does not drink alcohol and does not use drugs.   Family History:  family history includes Asthma in her father; CVA in her mother; Diabetes in her mother; Heart attack in her father; Hypertension in her father; Lung cancer in her father and mother.    ROS:     Review of Systems  Constitutional: Negative.   HENT: Negative.    Eyes: Negative.   Respiratory: Negative.    Gastrointestinal: Negative.   Genitourinary: Negative.   Musculoskeletal: Negative.   Skin: Negative.   Neurological: Negative.   Endo/Heme/Allergies: Negative.   Psychiatric/Behavioral: Negative.    All other systems reviewed and are negative.     All other systems are reviewed and negative.    PHYSICAL EXAM: VS:  BP (!) 140/76   Ht 5' 3 (1.6 m)   Wt 160 lb (72.6 kg)   BMI 28.34 kg/m  , BMI Body mass index is 28.34 kg/m. Last weight:  Wt Readings from Last 3 Encounters:  02/24/24 160 lb (72.6 kg)  10/24/23 167 lb 3.2 oz (75.8 kg)  09/26/23 170 lb (77.1 kg)     Physical Exam Constitutional:      Appearance: Normal appearance.  Cardiovascular:     Rate and Rhythm: Normal rate and regular rhythm.     Heart sounds: Normal heart sounds.  Pulmonary:     Effort: Pulmonary effort is normal.     Breath sounds: Normal breath sounds.  Musculoskeletal:     Right lower leg: No edema.     Left lower leg: No edema.  Neurological:     Mental Status: She is alert.       EKG:   Recent Labs: 09/23/2023: ALT 9; BUN 13; Creatinine, Ser 0.85; Hemoglobin 11.6; Platelets 201; Potassium 4.0; Sodium 142    Lipid  Panel    Component Value Date/Time   CHOL 110 09/23/2023 1212   TRIG 83 09/23/2023 1212   HDL 55 09/23/2023 1212   LDLCALC 39 09/23/2023 1212      Other studies Reviewed: Additional studies/ records that were reviewed  today include:  Review of the above records demonstrates:       No data to display            ASSESSMENT AND PLAN:    ICD-10-CM   1. SOB (shortness of breath)  R06.02 PCV ECHOCARDIOGRAM COMPLETE   Says SOB and wants prednisone . Advise seeing Neelam.    2. Combined systolic and diastolic congestive heart failure, unspecified HF chronicity (HCC)  I50.40 PCV ECHOCARDIOGRAM COMPLETE    3. Hypertension associated with diabetes (HCC)  E11.59 PCV ECHOCARDIOGRAM COMPLETE   I15.2     4. Type 2 diabetes mellitus with vascular disease (HCC)  E11.59 PCV ECHOCARDIOGRAM COMPLETE    5. Chronic obstructive pulmonary disease, unspecified COPD type (HCC)  J44.9 PCV ECHOCARDIOGRAM COMPLETE    6. Mixed hyperlipidemia  E78.2 PCV ECHOCARDIOGRAM COMPLETE    7. Other chest pain  R07.89 PCV ECHOCARDIOGRAM COMPLETE    8. Current smoker  F17.200 PCV ECHOCARDIOGRAM COMPLETE    9. Combined hyperlipidemia associated with type 2 diabetes mellitus (HCC)  E11.69 PCV ECHOCARDIOGRAM COMPLETE   E78.2        Problem List Items Addressed This Visit       Cardiovascular and Mediastinum   Hypertension associated with diabetes (HCC) (Chronic)   Relevant Orders   PCV ECHOCARDIOGRAM COMPLETE   Congestive heart failure (HCC) (Chronic)   Relevant Orders   PCV ECHOCARDIOGRAM COMPLETE   Type 2 diabetes mellitus with vascular disease (HCC) (Chronic)   Relevant Orders   PCV ECHOCARDIOGRAM COMPLETE     Respiratory   COPD (chronic obstructive pulmonary disease) (HCC) (Chronic)   Relevant Orders   PCV ECHOCARDIOGRAM COMPLETE     Endocrine   Combined hyperlipidemia associated with type 2 diabetes mellitus (HCC)   Relevant Orders   PCV ECHOCARDIOGRAM COMPLETE     Other   HLD  (hyperlipidemia)   Relevant Orders   PCV ECHOCARDIOGRAM COMPLETE   Current smoker   Relevant Orders   PCV ECHOCARDIOGRAM COMPLETE   SOB (shortness of breath) - Primary   Relevant Orders   PCV ECHOCARDIOGRAM COMPLETE   Other Visit Diagnoses       Other chest pain       Relevant Orders   PCV ECHOCARDIOGRAM COMPLETE          Disposition:   No follow-ups on file.    Total time spent: 35 minutes  Signed,  Denyse Bathe, MD  02/24/2024 1:44 PM    [facility] "

## 2024-02-27 ENCOUNTER — Ambulatory Visit

## 2024-02-27 ENCOUNTER — Telehealth: Payer: Self-pay | Admitting: Internal Medicine

## 2024-02-27 VITALS — BP 134/84 | HR 77 | Ht 63.0 in | Wt 162.4 lb

## 2024-02-27 DIAGNOSIS — E663 Overweight: Secondary | ICD-10-CM

## 2024-02-27 DIAGNOSIS — E1159 Type 2 diabetes mellitus with other circulatory complications: Secondary | ICD-10-CM

## 2024-02-27 DIAGNOSIS — G8929 Other chronic pain: Secondary | ICD-10-CM

## 2024-02-27 DIAGNOSIS — J441 Chronic obstructive pulmonary disease with (acute) exacerbation: Secondary | ICD-10-CM

## 2024-02-27 DIAGNOSIS — Z122 Encounter for screening for malignant neoplasm of respiratory organs: Secondary | ICD-10-CM | POA: Insufficient documentation

## 2024-02-27 DIAGNOSIS — E1169 Type 2 diabetes mellitus with other specified complication: Secondary | ICD-10-CM

## 2024-02-27 DIAGNOSIS — M2550 Pain in unspecified joint: Secondary | ICD-10-CM

## 2024-02-27 DIAGNOSIS — I152 Hypertension secondary to endocrine disorders: Secondary | ICD-10-CM

## 2024-02-27 DIAGNOSIS — M25512 Pain in left shoulder: Secondary | ICD-10-CM

## 2024-02-27 DIAGNOSIS — Z6828 Body mass index (BMI) 28.0-28.9, adult: Secondary | ICD-10-CM | POA: Insufficient documentation

## 2024-02-27 DIAGNOSIS — E782 Mixed hyperlipidemia: Secondary | ICD-10-CM

## 2024-02-27 DIAGNOSIS — Z1211 Encounter for screening for malignant neoplasm of colon: Secondary | ICD-10-CM | POA: Insufficient documentation

## 2024-02-27 MED ORDER — CELECOXIB 200 MG PO CAPS: 200.0000 mg | ORAL_CAPSULE | Freq: Every day | ORAL | 2 refills | Status: AC

## 2024-02-27 MED ORDER — LEVOFLOXACIN 500 MG PO TABS: 750.0000 mg | ORAL_TABLET | Freq: Every day | ORAL | 0 refills | Status: AC

## 2024-02-27 MED ORDER — FLUTICASONE FUROATE-VILANTEROL 100-25 MCG/ACT IN AEPB: 1.0000 | INHALATION_SPRAY | Freq: Every day | RESPIRATORY_TRACT | 2 refills | Status: DC

## 2024-02-27 MED ORDER — FLUTICASONE FUROATE-VILANTEROL 100-25 MCG/ACT IN AEPB: 1.0000 | INHALATION_SPRAY | Freq: Every day | RESPIRATORY_TRACT | 2 refills | Status: AC

## 2024-02-27 MED ORDER — AIRSUPRA 90-80 MCG/ACT IN AERO: 2.0000 | INHALATION_SPRAY | RESPIRATORY_TRACT | 2 refills | Status: DC | PRN

## 2024-02-27 MED ORDER — AIRSUPRA 90-80 MCG/ACT IN AERO: 2.0000 | INHALATION_SPRAY | RESPIRATORY_TRACT | 2 refills | Status: AC | PRN

## 2024-02-27 MED ORDER — METHYLPREDNISOLONE 4 MG PO TBPK: ORAL_TABLET | ORAL | 0 refills | Status: AC

## 2024-02-27 NOTE — Progress Notes (Signed)
 "  Established Patient Office Visit  Subjective:  Patient ID: Angela Daniel, female    DOB: Jul 20, 1955  Age: 69 y.o. MRN: [id number]  Chief Complaint  Patient presents with   Follow-up    Patient is here today for her 3 months follow up.  She has been feeling poorly since last appointment.   She does have additional concerns to discuss today.   Patient reports having chills, chronic cough, shortness of breath, fatigue with hx of COPD. Patient states she is still smoking 1/2 pack a day. She reports symptoms have been lingering for over a month. Denies any sick contacts. Previously was taking OTC cold medication such as Nyquil and Mucinex  but it did not help so she stopped taking them a few weeks ago. Reports using albuterol  3-4 times a day; Advair 2 times day. Will increase to Breo 100 mcg inhaler and stop Advair. Refill Albuterol  but switch to airsupra . Reordered low dose chest CT and reinforced importance of lung cancer screening completed. Chest xray ordered for today. Will send in Medrol  dose pack and Levaquin  while we await chest xray results. Reinforced smoking cessation.  Labs are due today but patient is not fasting. Patient reports she will return early next week for labs. She does need refills. Patient reports needing Celebrex  refilled for her arthritic pains.  I have reviewed her active problem list, medication list, allergies, family history, social history, health maintenance, notes from last encounter, lab results for her appointment today.    Patient is due for Medicare AWV and colon cancer screening. Cologuard ordered to be completed.    No other concerns at this time.   Past Medical History:  Diagnosis Date   Congestive heart failure (HCC)    COPD (chronic obstructive pulmonary disease) (HCC)    Degenerative joint disease    Diabetes mellitus without complication (HCC)    Fibromyalgia    GERD (gastroesophageal reflux disease)    Hypertension    Scoliosis    Sleep  apnea     Past Surgical History:  Procedure Laterality Date   ABDOMINAL HYSTERECTOMY     APPENDECTOMY     CESAREAN SECTION     x3   CHOLECYSTECTOMY     HIP SURGERY Left    joint replacement   KYPHOPLASTY N/A 05/18/2019   Procedure: T6 KYPHOPLASTY;  Surgeon: Kathlynn Sharper, MD;  Location: [facility] ORS;  Service: Orthopedics;  Laterality: N/A;    Social History   Socioeconomic History   Marital status: Single    Spouse name: Not on file   Number of children: Not on file   Years of education: Not on file   Highest education level: Not on file  Occupational History   Not on file  Tobacco Use   Smoking status: Every Day    Current packs/day: 0.50    Types: Cigarettes   Smokeless tobacco: Never  Vaping Use   Vaping status: Never Used  Substance and Sexual Activity   Alcohol use: No   Drug use: No   Sexual activity: Not on file  Other Topics Concern   Not on file  Social History Narrative   Not on file   Social Drivers of Health   Tobacco Use: High Risk (02/27/2024)   Patient History    Smoking Tobacco Use: Every Day    Smokeless Tobacco Use: Never    Passive Exposure: Not on file  Financial Resource Strain: Not on file  Food Insecurity: Not on file  Transportation Needs: Not  on file  Physical Activity: Not on file  Stress: Not on file  Social Connections: Not on file  Intimate Partner Violence: Not on file  Depression (PHQ2-9): Low Risk (05/15/2023)   Depression (PHQ2-9)    PHQ-2 Score: 3  Alcohol Screen: Not on file  Housing: Not on file  Utilities: Not on file  Health Literacy: Not on file    Family History  Problem Relation Age of Onset   CVA Mother    Lung cancer Mother    Diabetes Mother    Heart attack Father    Hypertension Father    Lung cancer Father    Asthma Father    Breast cancer Neg Hx     Allergies[1]  Review of Systems  Constitutional:  Positive for chills and malaise/fatigue.  HENT: Negative.    Eyes:  Negative for blurred vision and  pain.  Respiratory:  Positive for cough, sputum production, shortness of breath and wheezing.   Cardiovascular:  Negative for chest pain, palpitations, claudication and leg swelling.  Gastrointestinal:  Negative for abdominal pain, blood in stool, constipation, diarrhea, nausea and vomiting.  Genitourinary:  Negative for dysuria, frequency and urgency.  Musculoskeletal:  Positive for joint pain (chronic painc).  Skin: Negative.   Neurological:  Negative for dizziness, tingling, sensory change and headaches.  Endo/Heme/Allergies: Negative.   Psychiatric/Behavioral: Negative.         Objective:   BP 134/84   Pulse 77   Ht 5' 3 (1.6 m)   Wt 162 lb 6.4 oz (73.7 kg)   SpO2 99%   BMI 28.77 kg/m   Vitals:   02/27/24 1325  BP: 134/84  Pulse: 77  Height: 5' 3 (1.6 m)  Weight: 162 lb 6.4 oz (73.7 kg)  SpO2: 99%  BMI (Calculated): 28.78    Physical Exam Vitals and nursing note reviewed.  Constitutional:      Appearance: Normal appearance.  HENT:     Head: Normocephalic.  Eyes:     Extraocular Movements: Extraocular movements intact.     Pupils: Pupils are equal, round, and reactive to light.  Cardiovascular:     Rate and Rhythm: Normal rate and regular rhythm.     Pulses: Normal pulses.     Heart sounds: Normal heart sounds. No murmur heard. Pulmonary:     Effort: Pulmonary effort is normal. No respiratory distress.     Breath sounds: Decreased breath sounds present. No rhonchi.  Abdominal:     General: There is no distension.     Tenderness: There is no abdominal tenderness.  Musculoskeletal:        General: No tenderness. Normal range of motion.     Cervical back: Normal range of motion and neck supple.     Right lower leg: No edema.     Left lower leg: No edema.  Skin:    General: Skin is warm and dry.     Coloration: Skin is not jaundiced.     Findings: No erythema.  Neurological:     General: No focal deficit present.     Mental Status: She is alert and  oriented to person, place, and time.  Psychiatric:        Mood and Affect: Mood normal.        Speech: Speech normal.        Behavior: Behavior is cooperative.        Cognition and Memory: Memory is not impaired.      No results found for any  visits on 02/27/24.  No results found for this or any previous visit (from the past 2160 hours).     Assessment & Plan:   Assessment & Plan COPD with acute exacerbation (HCC) Screening for lung cancer - reordered low dose chest CT. - Chest xray today. - Reinforced smoking cessation. - Stop advair. Switch to Breo. Refill Albuterol  (switched to airsupra ) - Check labs when fasting Combined hyperlipidemia associated with type 2 diabetes mellitus (HCC) Hypertension associated with diabetes (HCC) Type 2 diabetes mellitus with vascular disease (HCC) Overweight with body mass index (BMI) of 28 to 28.9 in adult - Reinforced healthy diet and exercise as tolerated. - Continue medications as prescribed. - Check labs when fasting Arthralgia of multiple joints Chronic left shoulder pain - Refill sent. Colon cancer screening - Cologuard ordered.    Return in about 1 week (around 03/05/2024) for medicare AWV and FU.   Total time spent: 30 minutes  Oddis DELENA Cain, FNP  02/27/2024   This document may have been prepared by [facility] Voice Recognition software and as such may include unintentional dictation errors.     [1]  Allergies Allergen Reactions   Tramadol Hives   Tylenol  [Acetaminophen ] Nausea And Vomiting   Vicodin [Hydrocodone -Acetaminophen ] Hives   "

## 2024-02-27 NOTE — Assessment & Plan Note (Signed)
 Refill sent

## 2024-02-27 NOTE — Assessment & Plan Note (Signed)
-   Reinforced healthy diet and exercise as tolerated. - Continue medications as prescribed. - Check labs when fasting

## 2024-02-27 NOTE — Assessment & Plan Note (Signed)
-   reordered low dose chest CT. - Chest xray today. - Reinforced smoking cessation. - Stop advair. Switch to Breo. Refill Albuterol  (switched to airsupra ) - Check labs when fasting

## 2024-02-27 NOTE — Telephone Encounter (Signed)
 Pharm called in regards to bnoth inhalers that were prescribed for pt Angela Daniel from pharm states that the Breo inhaler needs to be quantity 60 & Airsupra  inhaler needs to be at quantity 10.7

## 2024-02-27 NOTE — Addendum Note (Signed)
 Addended by: ADINE NIPPER A on: 02/27/2024 03:49 PM   Modules accepted: Orders

## 2024-02-27 NOTE — Assessment & Plan Note (Signed)
 Cologuard ordered

## 2024-03-05 ENCOUNTER — Ambulatory Visit: Admitting: Internal Medicine

## 2024-03-26 ENCOUNTER — Other Ambulatory Visit

## 2024-04-01 ENCOUNTER — Ambulatory Visit: Admitting: Cardiovascular Disease
